# Patient Record
Sex: Female | Born: 1945 | Race: White | Hispanic: No | Marital: Married | Smoking: Former smoker
Health system: Southern US, Community
[De-identification: ages and names within clinical notes are randomized; demographics above are authoritative.]

## PROBLEM LIST (undated history)

## (undated) DIAGNOSIS — M2550 Pain in unspecified joint: Secondary | ICD-10-CM

## (undated) DIAGNOSIS — E0510 Thyrotoxicosis with toxic single thyroid nodule without thyrotoxic crisis or storm: Secondary | ICD-10-CM

## (undated) DIAGNOSIS — M1990 Unspecified osteoarthritis, unspecified site: Secondary | ICD-10-CM

## (undated) DIAGNOSIS — Z87898 Personal history of other specified conditions: Secondary | ICD-10-CM

## (undated) DIAGNOSIS — E785 Hyperlipidemia, unspecified: Secondary | ICD-10-CM

## (undated) DIAGNOSIS — G4733 Obstructive sleep apnea (adult) (pediatric): Secondary | ICD-10-CM

## (undated) DIAGNOSIS — Z9289 Personal history of other medical treatment: Secondary | ICD-10-CM

## (undated) DIAGNOSIS — R600 Localized edema: Secondary | ICD-10-CM

## (undated) DIAGNOSIS — I10 Essential (primary) hypertension: Secondary | ICD-10-CM

## (undated) DIAGNOSIS — K219 Gastro-esophageal reflux disease without esophagitis: Secondary | ICD-10-CM

## (undated) DIAGNOSIS — R51 Headache: Secondary | ICD-10-CM

## (undated) DIAGNOSIS — C4491 Basal cell carcinoma of skin, unspecified: Secondary | ICD-10-CM

## (undated) DIAGNOSIS — J309 Allergic rhinitis, unspecified: Secondary | ICD-10-CM

## (undated) DIAGNOSIS — K5900 Constipation, unspecified: Secondary | ICD-10-CM

## (undated) DIAGNOSIS — R519 Headache, unspecified: Secondary | ICD-10-CM

## (undated) DIAGNOSIS — E7800 Pure hypercholesterolemia, unspecified: Secondary | ICD-10-CM

## (undated) DIAGNOSIS — E039 Hypothyroidism, unspecified: Secondary | ICD-10-CM

## (undated) HISTORY — DX: Pain in unspecified joint: M25.50

## (undated) HISTORY — PX: BREAST BIOPSY: SHX20

## (undated) HISTORY — DX: Morbid (severe) obesity due to excess calories: E66.01

## (undated) HISTORY — DX: Essential (primary) hypertension: I10

## (undated) HISTORY — DX: Obstructive sleep apnea (adult) (pediatric): G47.33

## (undated) HISTORY — PX: ANKLE ARTHROTOMY: SHX879

## (undated) HISTORY — PX: KNEE ARTHROSCOPY: SUR90

## (undated) HISTORY — PX: HIP FRACTURE SURGERY: SHX118

## (undated) HISTORY — DX: Hyperlipidemia, unspecified: E78.5

## (undated) HISTORY — DX: Unspecified osteoarthritis, unspecified site: M19.90

## (undated) HISTORY — DX: Pure hypercholesterolemia, unspecified: E78.00

## (undated) HISTORY — DX: Basal cell carcinoma of skin, unspecified: C44.91

## (undated) HISTORY — PX: CARPAL TUNNEL RELEASE: SHX101

## (undated) HISTORY — PX: OTHER SURGICAL HISTORY: SHX169

## (undated) HISTORY — DX: Localized edema: R60.0

## (undated) HISTORY — PX: BREAST SURGERY: SHX581

## (undated) HISTORY — DX: Thyrotoxicosis with toxic single thyroid nodule without thyrotoxic crisis or storm: E05.10

## (undated) HISTORY — DX: Hypothyroidism, unspecified: E03.9

## (undated) HISTORY — DX: Allergic rhinitis, unspecified: J30.9

## (undated) HISTORY — DX: Constipation, unspecified: K59.00

---

## 1971-02-20 HISTORY — PX: DILATION AND CURETTAGE OF UTERUS: SHX78

## 1972-02-20 HISTORY — PX: DILATION AND CURETTAGE OF UTERUS: SHX78

## 1973-02-19 HISTORY — PX: DILATION AND CURETTAGE OF UTERUS: SHX78

## 1975-02-20 HISTORY — PX: OTHER SURGICAL HISTORY: SHX169

## 1976-02-20 HISTORY — PX: DILATION AND CURETTAGE OF UTERUS: SHX78

## 1989-02-19 HISTORY — PX: DILATION AND CURETTAGE OF UTERUS: SHX78

## 1997-06-23 ENCOUNTER — Ambulatory Visit (HOSPITAL_COMMUNITY): Admission: RE | Admit: 1997-06-23 | Discharge: 1997-06-23 | Payer: Self-pay | Admitting: Obstetrics and Gynecology

## 1997-07-26 ENCOUNTER — Ambulatory Visit (HOSPITAL_COMMUNITY): Admission: RE | Admit: 1997-07-26 | Discharge: 1997-07-26 | Payer: Self-pay | Admitting: Obstetrics and Gynecology

## 1998-02-19 HISTORY — PX: IUD REMOVAL: SHX5392

## 1998-04-06 ENCOUNTER — Ambulatory Visit (HOSPITAL_COMMUNITY): Admission: RE | Admit: 1998-04-06 | Discharge: 1998-04-06 | Payer: Self-pay | Admitting: Obstetrics and Gynecology

## 1998-04-06 ENCOUNTER — Encounter: Payer: Self-pay | Admitting: Obstetrics and Gynecology

## 1998-08-24 ENCOUNTER — Encounter: Payer: Self-pay | Admitting: Emergency Medicine

## 1998-08-24 ENCOUNTER — Emergency Department (HOSPITAL_COMMUNITY): Admission: EM | Admit: 1998-08-24 | Discharge: 1998-08-24 | Payer: Self-pay | Admitting: Emergency Medicine

## 1998-11-01 ENCOUNTER — Ambulatory Visit (HOSPITAL_COMMUNITY): Admission: RE | Admit: 1998-11-01 | Discharge: 1998-11-01 | Payer: Self-pay | Admitting: Obstetrics and Gynecology

## 1998-11-01 ENCOUNTER — Encounter: Payer: Self-pay | Admitting: Obstetrics and Gynecology

## 1999-02-26 ENCOUNTER — Emergency Department (HOSPITAL_COMMUNITY): Admission: EM | Admit: 1999-02-26 | Discharge: 1999-02-26 | Payer: Self-pay | Admitting: Emergency Medicine

## 1999-02-26 ENCOUNTER — Encounter: Payer: Self-pay | Admitting: Emergency Medicine

## 1999-04-14 ENCOUNTER — Other Ambulatory Visit: Admission: RE | Admit: 1999-04-14 | Discharge: 1999-04-14 | Payer: Self-pay | Admitting: Obstetrics and Gynecology

## 1999-12-05 ENCOUNTER — Ambulatory Visit (HOSPITAL_COMMUNITY): Admission: RE | Admit: 1999-12-05 | Discharge: 1999-12-05 | Payer: Self-pay | Admitting: Obstetrics and Gynecology

## 1999-12-05 ENCOUNTER — Encounter: Payer: Self-pay | Admitting: Obstetrics and Gynecology

## 2000-07-08 ENCOUNTER — Encounter: Admission: RE | Admit: 2000-07-08 | Discharge: 2000-07-08 | Payer: Self-pay | Admitting: Obstetrics and Gynecology

## 2000-07-08 ENCOUNTER — Encounter: Payer: Self-pay | Admitting: Obstetrics and Gynecology

## 2000-10-17 ENCOUNTER — Other Ambulatory Visit: Admission: RE | Admit: 2000-10-17 | Discharge: 2000-10-17 | Payer: Self-pay | Admitting: Obstetrics and Gynecology

## 2000-12-31 ENCOUNTER — Ambulatory Visit (HOSPITAL_COMMUNITY): Admission: RE | Admit: 2000-12-31 | Discharge: 2000-12-31 | Payer: Self-pay | Admitting: Obstetrics and Gynecology

## 2000-12-31 ENCOUNTER — Encounter: Payer: Self-pay | Admitting: Obstetrics and Gynecology

## 2001-03-05 ENCOUNTER — Ambulatory Visit (HOSPITAL_COMMUNITY): Admission: RE | Admit: 2001-03-05 | Discharge: 2001-03-05 | Payer: Self-pay | Admitting: Gastroenterology

## 2001-11-24 ENCOUNTER — Other Ambulatory Visit: Admission: RE | Admit: 2001-11-24 | Discharge: 2001-11-24 | Payer: Self-pay | Admitting: Obstetrics and Gynecology

## 2002-05-20 ENCOUNTER — Ambulatory Visit (HOSPITAL_COMMUNITY): Admission: RE | Admit: 2002-05-20 | Discharge: 2002-05-20 | Payer: Self-pay | Admitting: Obstetrics and Gynecology

## 2002-05-20 ENCOUNTER — Encounter: Payer: Self-pay | Admitting: Obstetrics and Gynecology

## 2003-02-24 ENCOUNTER — Other Ambulatory Visit: Admission: RE | Admit: 2003-02-24 | Discharge: 2003-02-24 | Payer: Self-pay | Admitting: Obstetrics and Gynecology

## 2003-06-02 ENCOUNTER — Ambulatory Visit (HOSPITAL_COMMUNITY): Admission: RE | Admit: 2003-06-02 | Discharge: 2003-06-02 | Payer: Self-pay | Admitting: Obstetrics and Gynecology

## 2004-05-26 ENCOUNTER — Other Ambulatory Visit: Admission: RE | Admit: 2004-05-26 | Discharge: 2004-05-26 | Payer: Self-pay | Admitting: Obstetrics and Gynecology

## 2004-10-10 ENCOUNTER — Ambulatory Visit (HOSPITAL_COMMUNITY): Admission: RE | Admit: 2004-10-10 | Discharge: 2004-10-10 | Payer: Self-pay | Admitting: Obstetrics and Gynecology

## 2005-08-13 ENCOUNTER — Encounter: Admission: RE | Admit: 2005-08-13 | Discharge: 2005-08-13 | Payer: Self-pay | Admitting: Sports Medicine

## 2005-08-24 ENCOUNTER — Other Ambulatory Visit: Admission: RE | Admit: 2005-08-24 | Discharge: 2005-08-24 | Payer: Self-pay | Admitting: Obstetrics and Gynecology

## 2005-09-05 ENCOUNTER — Encounter: Admission: RE | Admit: 2005-09-05 | Discharge: 2005-09-05 | Payer: Self-pay | Admitting: Obstetrics and Gynecology

## 2006-10-01 ENCOUNTER — Ambulatory Visit (HOSPITAL_COMMUNITY): Admission: RE | Admit: 2006-10-01 | Discharge: 2006-10-01 | Payer: Self-pay | Admitting: Obstetrics and Gynecology

## 2007-10-30 ENCOUNTER — Ambulatory Visit (HOSPITAL_COMMUNITY): Admission: RE | Admit: 2007-10-30 | Discharge: 2007-10-30 | Payer: Self-pay | Admitting: Obstetrics and Gynecology

## 2007-11-03 ENCOUNTER — Ambulatory Visit (HOSPITAL_BASED_OUTPATIENT_CLINIC_OR_DEPARTMENT_OTHER): Admission: RE | Admit: 2007-11-03 | Discharge: 2007-11-03 | Payer: Self-pay | Admitting: Otolaryngology

## 2007-11-08 ENCOUNTER — Ambulatory Visit: Payer: Self-pay | Admitting: Internal Medicine

## 2007-11-19 ENCOUNTER — Encounter (HOSPITAL_COMMUNITY): Admission: RE | Admit: 2007-11-19 | Discharge: 2008-01-28 | Payer: Self-pay | Admitting: Internal Medicine

## 2007-12-05 ENCOUNTER — Encounter (HOSPITAL_COMMUNITY): Admission: RE | Admit: 2007-12-05 | Discharge: 2008-01-29 | Payer: Self-pay | Admitting: Internal Medicine

## 2009-01-17 ENCOUNTER — Ambulatory Visit (HOSPITAL_BASED_OUTPATIENT_CLINIC_OR_DEPARTMENT_OTHER): Admission: RE | Admit: 2009-01-17 | Discharge: 2009-01-18 | Payer: Self-pay | Admitting: Orthopedic Surgery

## 2009-05-17 ENCOUNTER — Encounter: Admission: RE | Admit: 2009-05-17 | Discharge: 2009-05-17 | Payer: Self-pay | Admitting: Orthopedic Surgery

## 2009-06-09 ENCOUNTER — Ambulatory Visit (HOSPITAL_BASED_OUTPATIENT_CLINIC_OR_DEPARTMENT_OTHER): Admission: RE | Admit: 2009-06-09 | Discharge: 2009-06-10 | Payer: Self-pay | Admitting: Orthopedic Surgery

## 2009-09-29 ENCOUNTER — Ambulatory Visit (HOSPITAL_BASED_OUTPATIENT_CLINIC_OR_DEPARTMENT_OTHER): Admission: RE | Admit: 2009-09-29 | Discharge: 2009-09-29 | Payer: Self-pay | Admitting: Orthopedic Surgery

## 2009-11-08 ENCOUNTER — Ambulatory Visit (HOSPITAL_COMMUNITY): Admission: RE | Admit: 2009-11-08 | Discharge: 2009-11-08 | Payer: Self-pay | Admitting: Obstetrics and Gynecology

## 2010-05-05 LAB — POCT I-STAT, CHEM 8
Chloride: 109 mEq/L (ref 96–112)
Creatinine, Ser: 0.6 mg/dL (ref 0.4–1.2)
HCT: 41 % (ref 36.0–46.0)

## 2010-05-09 LAB — POCT I-STAT, CHEM 8
Calcium, Ion: 1.15 mmol/L (ref 1.12–1.32)
Creatinine, Ser: 0.6 mg/dL (ref 0.4–1.2)
Glucose, Bld: 130 mg/dL — ABNORMAL HIGH (ref 70–99)
HCT: 41 % (ref 36.0–46.0)
Potassium: 3.9 mEq/L (ref 3.5–5.1)
TCO2: 28 mmol/L (ref 0–100)

## 2010-05-09 LAB — POCT HEMOGLOBIN-HEMACUE: Hemoglobin: 13.2 g/dL (ref 12.0–15.0)

## 2010-05-24 LAB — POCT HEMOGLOBIN-HEMACUE: Hemoglobin: 13.2 g/dL (ref 12.0–15.0)

## 2010-07-04 NOTE — Procedures (Signed)
NAME:  Cassandra Ho, Cassandra Ho           ACCOUNT NO.:  [id number]   MEDICAL RECORD NO.:  [id number]          PATIENT TYPE:  OUT   LOCATION:  SLEEP CENTER                 FACILITY:  [facility]   PHYSICIAN:  Clinton D. Maple Hudson, MD, FCCP, FACPDATE OF BIRTH:  1945/08/16   DATE OF STUDY:  11/03/2007                            NOCTURNAL POLYSOMNOGRAM   REFERRING PHYSICIAN:   REQUESTING PHYSICIAN:  Lucky Cowboy, M.D.   INDICATION FOR STUDY:  Hypersomnia with sleep apnea.   EPWORTH SLEEPINESS SCORE:  6/24.   BMI:  34.5.   WEIGHT:  220 pounds.   HEIGHT:  67 inches.   NECK:  15 inches.   HOME MEDICATIONS:  Charted and reviewed.   SLEEP ARCHITECTURE:  Total sleep time 226 minutes with sleep efficiency  57.4%.  Sleep latency was 64 minutes with sleep onset not sustained  until 1:00 a.m.  Stage I was 10.8%.  Stage II 89.2%.  Stages III and REM  were absent.  Awake after sleep onset 107 minutes.  Arousal index 30.3,  indicating increased EEG arousal.  Bedtime medication included Nasonex  and Tylenol P.M.   RESPIRATORY DATA:  Apnea-hypopnea index (AHI) 45.7 per hour.  A total of  172 events were scored, including 121 obstructive apneas, 3 central  apneas, 2 mixed apneas, and 46 hypopneas.  Events were more common while  supine but noted in all positions.  REM AHI N/A.  She was unable to  accumulate enough sleep time to meet requirements for CPAP titration by  split-study protocol on this study night.   OXYGEN DATA:  Moderately loud snoring with oxygen desaturation to a  nadir of 84%.  Mean oxygen saturation through the study was 90.6% on  room air.  A total of 13.7 minutes was spent with saturation less than  88% on room air.   CARDIAC DATA:  Normal sinus rhythm with occasional PAC.   MOVEMENT-PARASOMNIA:  No significant movement disturbance.  No bathroom  trips.   IMPRESSIONS-RECOMMENDATIONS:  1. Moderate obstructive sleep apnea/hypopnea syndrome, apnea-hypopnea      index (AHI) 45.7 per  hour.  Events were seen in all positions but      more common while supine.  Moderately loud snoring with oxygen      desaturation to a nadir of 84% on room air.  2. She did not meet split protocol criteria for CPAP titration because      of insufficient sleep reflecting delayed sleep onset until 1:00      a.m.  Consider return for CPAP titration.  Will evaluate for      alternative management as appropriate.      Clinton D. Maple Hudson, MD, [facility], FACP  Diplomate, Biomedical engineer of Sleep Medicine  Electronically Signed     CDY/MEDQ  D:  11/08/2007 10:40:24  T:  11/08/2007 14:49:56  Job:  [id number]

## 2010-07-07 NOTE — Procedures (Signed)
[facility]  Patient:    Cassandra Ho, Cassandra Ho Visit Number: [id number] MRN: [id number]          Service Type: Attending:  Verlin Grills, M.D. Dictated by:   Verlin Grills, M.D. Proc. Date: 03/05/01   CC:         Hal T. Stoneking, M.D.   Procedure Report  PROCEDURE:  Screening colonoscopy.  INDICATION FOR PROCEDURE:  Ms. [name] is a 65 year old female born 1945/05/08. Ten years ago, she underwent a screening flexible proctosigmoidoscopy which apparently was normal. She is now due for a surveillance colonoscopy with polypectomy to prevent colon cancer.  I discussed with Ms. Barnaby the complications associated with colonoscopy and polypectomy including a 15/1000 risk of bleeding and 05/998 risk of colon perforation requiring surgical repair. Ms. Roell has signed the operative permit.  ENDOSCOPIST:  Verlin Grills, M.D.  PREMEDICATION:  Versed 7.5 mg, Demerol 50 mg.  ENDOSCOPE:  Olympus pediatric colonoscope.  DESCRIPTION OF PROCEDURE:  After obtaining informed consent, the patient was placed in the left lateral decubitus position. I administered intravenous Demerol and intravenous Versed to achieve conscious sedation for the procedure. The patients cardiac rhythm, oxygen saturation and blood pressure were monitored throughout the procedure and documented in the medical record.  Anal inspection was normal. Digital rectal exam was normal. The Olympus pediatric video colonoscope was introduced into the rectum and easily advanced to the cecum. A normal appearing ileocecal valve was intubated and the distal ileum inspected. Colonic preparation for the exam today was excellent.  RECTUM:  Normal.  SIGMOID COLON/DESCENDING COLON:  Normal.  SPLENIC FLEXURE:  Normal.  TRANSVERSE COLON:  Normal.  HEPATIC FLEXURE:  Normal.  ASCENDING COLON:  Normal.  CECUM/ILEOCECAL VALVE:  Normal.  DISTAL ILEUM:   Normal.  ASSESSMENT:  Normal screening proctocolonoscopy to the cecum with intubation of a normal appearing ileocecal valve and distal ileal inspection. Dictated by:   Verlin Grills, M.D. Attending:  Verlin Grills, M.D. DD:  03/05/01 TD:  03/05/01 Job: [id number]

## 2011-05-10 ENCOUNTER — Other Ambulatory Visit (HOSPITAL_COMMUNITY): Payer: Self-pay | Admitting: Cardiology

## 2011-05-10 DIAGNOSIS — Z1231 Encounter for screening mammogram for malignant neoplasm of breast: Secondary | ICD-10-CM

## 2011-06-04 ENCOUNTER — Ambulatory Visit (HOSPITAL_COMMUNITY): Payer: Self-pay

## 2011-06-27 ENCOUNTER — Ambulatory Visit (HOSPITAL_COMMUNITY)
Admission: RE | Admit: 2011-06-27 | Discharge: 2011-06-27 | Disposition: A | Discharge status: Home / Self Care | Payer: 59 | Source: Ambulatory Visit | Attending: Cardiology | Admitting: Cardiology

## 2011-06-27 DIAGNOSIS — Z1231 Encounter for screening mammogram for malignant neoplasm of breast: Secondary | ICD-10-CM

## 2011-10-18 DIAGNOSIS — G4730 Sleep apnea, unspecified: Secondary | ICD-10-CM | POA: Diagnosis not present

## 2011-10-30 DIAGNOSIS — E7800 Pure hypercholesterolemia, unspecified: Secondary | ICD-10-CM | POA: Diagnosis not present

## 2011-10-30 DIAGNOSIS — E890 Postprocedural hypothyroidism: Secondary | ICD-10-CM | POA: Diagnosis not present

## 2011-11-12 DIAGNOSIS — Z23 Encounter for immunization: Secondary | ICD-10-CM | POA: Diagnosis not present

## 2012-01-04 DIAGNOSIS — L919 Hypertrophic disorder of the skin, unspecified: Secondary | ICD-10-CM | POA: Diagnosis not present

## 2012-01-04 DIAGNOSIS — C44711 Basal cell carcinoma of skin of unspecified lower limb, including hip: Secondary | ICD-10-CM | POA: Diagnosis not present

## 2012-01-04 DIAGNOSIS — L821 Other seborrheic keratosis: Secondary | ICD-10-CM | POA: Diagnosis not present

## 2012-01-04 DIAGNOSIS — D485 Neoplasm of uncertain behavior of skin: Secondary | ICD-10-CM | POA: Diagnosis not present

## 2012-01-04 DIAGNOSIS — D239 Other benign neoplasm of skin, unspecified: Secondary | ICD-10-CM | POA: Diagnosis not present

## 2012-01-04 DIAGNOSIS — L820 Inflamed seborrheic keratosis: Secondary | ICD-10-CM | POA: Diagnosis not present

## 2012-01-04 DIAGNOSIS — L819 Disorder of pigmentation, unspecified: Secondary | ICD-10-CM | POA: Diagnosis not present

## 2012-01-23 DIAGNOSIS — D485 Neoplasm of uncertain behavior of skin: Secondary | ICD-10-CM | POA: Diagnosis not present

## 2012-01-23 DIAGNOSIS — C44711 Basal cell carcinoma of skin of unspecified lower limb, including hip: Secondary | ICD-10-CM | POA: Diagnosis not present

## 2012-01-23 DIAGNOSIS — L905 Scar conditions and fibrosis of skin: Secondary | ICD-10-CM | POA: Diagnosis not present

## 2012-04-30 DIAGNOSIS — Z79899 Other long term (current) drug therapy: Secondary | ICD-10-CM | POA: Diagnosis not present

## 2012-04-30 DIAGNOSIS — E782 Mixed hyperlipidemia: Secondary | ICD-10-CM | POA: Diagnosis not present

## 2012-10-22 DIAGNOSIS — Z79899 Other long term (current) drug therapy: Secondary | ICD-10-CM | POA: Diagnosis not present

## 2012-10-22 DIAGNOSIS — G4733 Obstructive sleep apnea (adult) (pediatric): Secondary | ICD-10-CM | POA: Diagnosis not present

## 2012-10-22 DIAGNOSIS — E669 Obesity, unspecified: Secondary | ICD-10-CM | POA: Diagnosis not present

## 2012-10-22 DIAGNOSIS — E7800 Pure hypercholesterolemia, unspecified: Secondary | ICD-10-CM | POA: Diagnosis not present

## 2012-10-22 DIAGNOSIS — I10 Essential (primary) hypertension: Secondary | ICD-10-CM | POA: Diagnosis not present

## 2012-10-23 DIAGNOSIS — E0510 Thyrotoxicosis with toxic single thyroid nodule without thyrotoxic crisis or storm: Secondary | ICD-10-CM | POA: Diagnosis not present

## 2012-10-23 DIAGNOSIS — E890 Postprocedural hypothyroidism: Secondary | ICD-10-CM | POA: Diagnosis not present

## 2012-10-23 DIAGNOSIS — E669 Obesity, unspecified: Secondary | ICD-10-CM | POA: Diagnosis not present

## 2012-10-24 DIAGNOSIS — M25579 Pain in unspecified ankle and joints of unspecified foot: Secondary | ICD-10-CM | POA: Diagnosis not present

## 2012-10-24 DIAGNOSIS — M76829 Posterior tibial tendinitis, unspecified leg: Secondary | ICD-10-CM | POA: Diagnosis not present

## 2012-11-06 DIAGNOSIS — E7800 Pure hypercholesterolemia, unspecified: Secondary | ICD-10-CM | POA: Diagnosis not present

## 2012-11-06 DIAGNOSIS — Z79899 Other long term (current) drug therapy: Secondary | ICD-10-CM | POA: Diagnosis not present

## 2012-11-12 ENCOUNTER — Other Ambulatory Visit: Payer: Self-pay | Admitting: Cardiology

## 2012-11-12 DIAGNOSIS — E7800 Pure hypercholesterolemia, unspecified: Secondary | ICD-10-CM

## 2012-11-12 DIAGNOSIS — Z79899 Other long term (current) drug therapy: Secondary | ICD-10-CM

## 2012-11-13 DIAGNOSIS — Z23 Encounter for immunization: Secondary | ICD-10-CM | POA: Diagnosis not present

## 2013-01-01 ENCOUNTER — Other Ambulatory Visit (HOSPITAL_COMMUNITY): Payer: Self-pay | Admitting: Internal Medicine

## 2013-01-01 DIAGNOSIS — Z1231 Encounter for screening mammogram for malignant neoplasm of breast: Secondary | ICD-10-CM

## 2013-01-02 ENCOUNTER — Ambulatory Visit (HOSPITAL_COMMUNITY)
Admission: RE | Admit: 2013-01-02 | Discharge: 2013-01-02 | Disposition: A | Discharge status: Home / Self Care | Payer: Medicare Other | Source: Ambulatory Visit | Attending: Internal Medicine | Admitting: Internal Medicine

## 2013-01-02 DIAGNOSIS — Z1231 Encounter for screening mammogram for malignant neoplasm of breast: Secondary | ICD-10-CM | POA: Diagnosis not present

## 2013-05-11 ENCOUNTER — Other Ambulatory Visit (INDEPENDENT_AMBULATORY_CARE_PROVIDER_SITE_OTHER): Payer: Medicare Other

## 2013-05-11 DIAGNOSIS — Z79899 Other long term (current) drug therapy: Secondary | ICD-10-CM | POA: Diagnosis not present

## 2013-05-11 DIAGNOSIS — E7800 Pure hypercholesterolemia, unspecified: Secondary | ICD-10-CM | POA: Diagnosis not present

## 2013-05-11 LAB — ALT: ALT: 22 U/L (ref 0–35)

## 2013-05-12 ENCOUNTER — Encounter: Payer: Self-pay | Admitting: General Surgery

## 2013-05-12 ENCOUNTER — Other Ambulatory Visit: Payer: Self-pay | Admitting: General Surgery

## 2013-05-12 DIAGNOSIS — E7800 Pure hypercholesterolemia, unspecified: Secondary | ICD-10-CM

## 2013-05-12 LAB — NMR LIPOPROFILE WITH LIPIDS
CHOLESTEROL, TOTAL: 127 mg/dL (ref <200)
HDL PARTICLE NUMBER: 42.1 umol/L (ref >=30.5)
HDL Size: 8.8 nm — ABNORMAL LOW (ref >=9.2)
HDL-C: 49 mg/dL (ref >=40)
LARGE HDL: 2.5 umol/L — AB (ref >=4.8)
LDL CALC: 49 mg/dL (ref <100)
LDL Particle Number: 1146 nmol/L — ABNORMAL HIGH (ref <1000)
LDL Size: 19.8 nm — ABNORMAL LOW (ref >20.5)
LP-IR Score: 66 — ABNORMAL HIGH (ref <=45)
Large VLDL-P: 4.7 nmol/L — ABNORMAL HIGH (ref <=2.7)
SMALL LDL PARTICLE NUMBER: 837 nmol/L — AB (ref <=527)
TRIGLYCERIDES: 144 mg/dL (ref <150)
VLDL Size: 49.5 nm — ABNORMAL HIGH (ref <=46.6)

## 2013-09-30 ENCOUNTER — Other Ambulatory Visit: Payer: Self-pay | Admitting: Cardiology

## 2013-10-16 ENCOUNTER — Encounter: Payer: Self-pay | Admitting: General Surgery

## 2013-10-16 DIAGNOSIS — E7800 Pure hypercholesterolemia, unspecified: Secondary | ICD-10-CM

## 2013-10-16 DIAGNOSIS — Z4689 Encounter for fitting and adjustment of other specified devices: Secondary | ICD-10-CM | POA: Insufficient documentation

## 2013-10-16 DIAGNOSIS — Z79899 Other long term (current) drug therapy: Secondary | ICD-10-CM | POA: Insufficient documentation

## 2013-10-16 DIAGNOSIS — E669 Obesity, unspecified: Secondary | ICD-10-CM | POA: Insufficient documentation

## 2013-10-16 DIAGNOSIS — I10 Essential (primary) hypertension: Secondary | ICD-10-CM | POA: Insufficient documentation

## 2013-10-16 DIAGNOSIS — E785 Hyperlipidemia, unspecified: Secondary | ICD-10-CM | POA: Insufficient documentation

## 2013-10-16 DIAGNOSIS — G4733 Obstructive sleep apnea (adult) (pediatric): Secondary | ICD-10-CM

## 2013-10-16 HISTORY — DX: Obstructive sleep apnea (adult) (pediatric): G47.33

## 2013-10-21 ENCOUNTER — Ambulatory Visit: Payer: 59 | Admitting: Cardiology

## 2013-11-12 ENCOUNTER — Other Ambulatory Visit: Payer: Medicare Other

## 2013-11-19 DIAGNOSIS — Z23 Encounter for immunization: Secondary | ICD-10-CM | POA: Diagnosis not present

## 2013-11-20 DIAGNOSIS — Z23 Encounter for immunization: Secondary | ICD-10-CM | POA: Diagnosis not present

## 2013-12-22 ENCOUNTER — Encounter: Payer: Self-pay | Admitting: Cardiology

## 2013-12-22 ENCOUNTER — Ambulatory Visit (INDEPENDENT_AMBULATORY_CARE_PROVIDER_SITE_OTHER): Payer: Medicare Other | Admitting: Cardiology

## 2013-12-22 VITALS — BP 132/90 | HR 85 | Ht 66.5 in | Wt 303.6 lb

## 2013-12-22 DIAGNOSIS — E7800 Pure hypercholesterolemia, unspecified: Secondary | ICD-10-CM

## 2013-12-22 DIAGNOSIS — G4733 Obstructive sleep apnea (adult) (pediatric): Secondary | ICD-10-CM

## 2013-12-22 DIAGNOSIS — I10 Essential (primary) hypertension: Secondary | ICD-10-CM | POA: Diagnosis not present

## 2013-12-22 MED ORDER — LOSARTAN POTASSIUM-HCTZ 50-12.5 MG PO TABS: ORAL_TABLET | ORAL | Status: DC

## 2013-12-22 MED ORDER — ROSUVASTATIN CALCIUM 20 MG PO TABS: ORAL_TABLET | ORAL | Status: DC

## 2013-12-22 NOTE — Patient Instructions (Signed)
Your physician recommends that you return for lab work on Monday, November 9th. (This will be FASTING labs)  Your physician wants you to follow-up in: one year with Dr. Radford Pax. You will receive a reminder letter in the mail two months in advance. If you don't receive a letter, please call our office to schedule the follow-up appointment.

## 2013-12-22 NOTE — Progress Notes (Signed)
Cassandra Ho, Cassandra Ho, Cassandra  Ho Phone: [phone] Fax:  [phone]  Date:  12/22/2013   ID:  Cassandra Ho, DOB 04-15-1945, MRN [id number]  PCP:  Delrae Rend, MD  Cardiologist:  Fransico Him, MD    History of Present Illness: Cassandra Ho is a 68 y.o. female with a history of OSA on CPAP and morbid obesity who presents today for followup.  She is doing well.  She did not tolerate CPAP therapy and got fitted for an oral device which she tolerates well. She has not daytime sleepiness and feels rested when she gets up in the am.  She is riding a stationary bike 3-5 miles daily.  She denies any chest pain, SOB, DOE, palpitations or dizziness. She has chronic LLE edema from prior injury.  Wt Readings from Last 3 Encounters:  12/22/13 303 lb 9.6 oz (137.712 kg)     Past Medical History  Diagnosis Date  . Morbid obesity   . Dyslipidemia   . Allergic rhinitis   . Hypothyroidism (acquired)     Post-ablation  . Toxic solitary thyroid nodule     radioactive iodine therapy with 31.4 mCi of I-131 on 12/05/2007  . Palpitations   . OSA (obstructive sleep apnea)     intolerant to CPAP and now using an oral device  . Hypertension     Current Outpatient Prescriptions  Medication Sig Dispense Refill  . Ascorbic Acid (VITAMIN C) 100 MG tablet Take 100 mg by mouth daily.    Marland Kitchen aspirin 81 MG tablet Take 81 mg by mouth daily.    . Calcium Carbonate-Vit D-Min (CALCIUM 1200 PO) Take 1 tablet by mouth daily.    . CRESTOR 20 MG tablet Take 1 tablet by mouth once a day 90 tablet 1  . diphenhydramine-acetaminophen (TYLENOL PM) 25-500 MG TABS Take 1 tablet by mouth at bedtime as needed.    . fenofibrate 160 MG tablet Take 1 tablet by mouth  daily with food 90 tablet 1  . FLUZONE HIGH-DOSE 0.5 ML SUSY   0  . levothyroxine (SYNTHROID, LEVOTHROID) 150 MCG tablet Take 150 mcg by mouth daily before breakfast.    . losartan-hydrochlorothiazide (HYZAAR) 50-12.5 MG per  tablet Take 1 tablet by mouth once a day 90 tablet 1  . Omeprazole-Sodium Bicarbonate (ZEGERID) 20-1100 MG CAPS capsule Take 1 capsule by mouth daily before breakfast.    . OVER THE COUNTER MEDICATION Ultimate OMEGA 3 tablets once a day    . PREVNAR 13 SUSP injection   0  . omeprazole (PRILOSEC) 20 MG capsule Take 20 mg by mouth daily.     No current facility-administered medications for this visit.    Allergies:   No Known Allergies  Social History:  The patient  reports that she has quit smoking. She does not have any smokeless tobacco history on file. She reports that she drinks alcohol. She reports that she does not use illicit drugs.   Family History:  The patient's family history includes Alcoholism in her father; Breast cancer in her sister; Cirrhosis in her father; Fibromyalgia in her sister; Lung cancer in her mother; Melanoma in her mother; Thyroid cancer in her mother.   ROS:  Please see the history of present illness.      All other systems reviewed and negative.   PHYSICAL EXAM: VS:  BP 132/90 mmHg  Pulse 85  Ht 5' 6.5" (1.689 m)  Wt 303 lb 9.6 oz (137.712 kg)  BMI  48.27 kg/m2 Well nourished, well developed, in no acute distress HEENT: normal Neck: no JVD Cardiac:  normal S1, S2; RRR; no murmur Lungs:  clear to auscultation bilaterally, no wheezing, rhonchi or rales Abd: soft, nontender, no hepatomegaly Ext: no edema Skin: warm and dry Neuro:  CNs 2-12 intact, no focal abnormalities noted  EKG:  NSR with no ST changes   ASSESSMENT AND PLAN: 1.  OSA - she did not tolerate her CPAP and is now using an oral device.   2.  Obesity - she is walking 3-5 miles daily on stationary bike 3.  Dyslipidemia - continue crestor and recheck NMR lipid panel and ALT 4.  HTN - borderline control - continue Hyzaar - at home it runs 132/55mmHg  Followup with me in 1 year   Signed, Fransico Him, MD [facility] 12/22/2013 3:55 PM

## 2013-12-28 ENCOUNTER — Encounter: Payer: Self-pay | Admitting: Cardiology

## 2013-12-28 ENCOUNTER — Other Ambulatory Visit (INDEPENDENT_AMBULATORY_CARE_PROVIDER_SITE_OTHER): Payer: Medicare Other | Admitting: *Deleted

## 2013-12-28 DIAGNOSIS — E890 Postprocedural hypothyroidism: Secondary | ICD-10-CM | POA: Diagnosis not present

## 2013-12-28 DIAGNOSIS — E7800 Pure hypercholesterolemia, unspecified: Secondary | ICD-10-CM

## 2013-12-28 LAB — LIPID PANEL
CHOLESTEROL: 126 mg/dL (ref 0–200)
HDL: 37.2 mg/dL — ABNORMAL LOW (ref >39.00)
LDL Cholesterol: 61 mg/dL (ref 0–99)
NonHDL: 88.8
TRIGLYCERIDES: 139 mg/dL (ref 0.0–149.0)
Total CHOL/HDL Ratio: 3
VLDL: 27.8 mg/dL (ref 0.0–40.0)

## 2013-12-28 LAB — ALT: ALT: 20 U/L (ref 0–35)

## 2013-12-31 DIAGNOSIS — E890 Postprocedural hypothyroidism: Secondary | ICD-10-CM | POA: Diagnosis not present

## 2013-12-31 DIAGNOSIS — Z1211 Encounter for screening for malignant neoplasm of colon: Secondary | ICD-10-CM | POA: Diagnosis not present

## 2013-12-31 DIAGNOSIS — Z13820 Encounter for screening for osteoporosis: Secondary | ICD-10-CM | POA: Diagnosis not present

## 2014-01-11 ENCOUNTER — Other Ambulatory Visit (HOSPITAL_COMMUNITY): Payer: Self-pay | Admitting: Internal Medicine

## 2014-01-11 DIAGNOSIS — Z1231 Encounter for screening mammogram for malignant neoplasm of breast: Secondary | ICD-10-CM

## 2014-01-25 ENCOUNTER — Ambulatory Visit (HOSPITAL_COMMUNITY)
Admission: RE | Admit: 2014-01-25 | Discharge: 2014-01-25 | Disposition: A | Discharge status: Home / Self Care | Payer: Medicare Other | Source: Ambulatory Visit | Attending: Internal Medicine | Admitting: Internal Medicine

## 2014-01-25 DIAGNOSIS — Z1231 Encounter for screening mammogram for malignant neoplasm of breast: Secondary | ICD-10-CM | POA: Insufficient documentation

## 2014-01-27 DIAGNOSIS — Z780 Asymptomatic menopausal state: Secondary | ICD-10-CM | POA: Diagnosis not present

## 2014-01-27 DIAGNOSIS — Z13820 Encounter for screening for osteoporosis: Secondary | ICD-10-CM | POA: Diagnosis not present

## 2014-04-07 ENCOUNTER — Other Ambulatory Visit: Payer: Self-pay | Admitting: Gastroenterology

## 2014-04-21 DIAGNOSIS — R05 Cough: Secondary | ICD-10-CM | POA: Diagnosis not present

## 2014-04-21 DIAGNOSIS — Z6841 Body Mass Index (BMI) 40.0 and over, adult: Secondary | ICD-10-CM | POA: Diagnosis not present

## 2014-04-21 DIAGNOSIS — R6884 Jaw pain: Secondary | ICD-10-CM | POA: Diagnosis not present

## 2014-04-21 DIAGNOSIS — R0982 Postnasal drip: Secondary | ICD-10-CM | POA: Diagnosis not present

## 2014-04-21 DIAGNOSIS — J387 Other diseases of larynx: Secondary | ICD-10-CM | POA: Diagnosis not present

## 2014-06-01 ENCOUNTER — Encounter (HOSPITAL_COMMUNITY): Payer: Self-pay | Admitting: *Deleted

## 2014-06-02 ENCOUNTER — Encounter: Payer: Self-pay | Admitting: Cardiology

## 2014-06-15 ENCOUNTER — Ambulatory Visit (HOSPITAL_COMMUNITY): Payer: Medicare Other | Admitting: Anesthesiology

## 2014-06-15 ENCOUNTER — Ambulatory Visit (HOSPITAL_COMMUNITY)
Admission: RE | Admit: 2014-06-15 | Discharge: 2014-06-15 | Disposition: A | Discharge status: Home / Self Care | Payer: Medicare Other | Source: Ambulatory Visit | Attending: Gastroenterology | Admitting: Gastroenterology

## 2014-06-15 ENCOUNTER — Encounter (HOSPITAL_COMMUNITY): Payer: Self-pay | Admitting: *Deleted

## 2014-06-15 ENCOUNTER — Encounter (HOSPITAL_COMMUNITY)
Admission: RE | Disposition: A | Discharge status: Home / Self Care | Payer: Self-pay | Source: Ambulatory Visit | Attending: Gastroenterology

## 2014-06-15 DIAGNOSIS — G4733 Obstructive sleep apnea (adult) (pediatric): Secondary | ICD-10-CM | POA: Diagnosis not present

## 2014-06-15 DIAGNOSIS — E780 Pure hypercholesterolemia: Secondary | ICD-10-CM | POA: Diagnosis not present

## 2014-06-15 DIAGNOSIS — Z1211 Encounter for screening for malignant neoplasm of colon: Secondary | ICD-10-CM | POA: Diagnosis not present

## 2014-06-15 DIAGNOSIS — E039 Hypothyroidism, unspecified: Secondary | ICD-10-CM | POA: Insufficient documentation

## 2014-06-15 HISTORY — PX: COLONOSCOPY WITH PROPOFOL: SHX5780

## 2014-06-15 HISTORY — DX: Personal history of other specified conditions: Z87.898

## 2014-06-15 HISTORY — DX: Headache: R51

## 2014-06-15 HISTORY — DX: Unspecified osteoarthritis, unspecified site: M19.90

## 2014-06-15 HISTORY — DX: Gastro-esophageal reflux disease without esophagitis: K21.9

## 2014-06-15 HISTORY — DX: Personal history of other medical treatment: Z92.89

## 2014-06-15 HISTORY — DX: Headache, unspecified: R51.9

## 2014-06-15 HISTORY — DX: Hypothyroidism, unspecified: E03.9

## 2014-06-15 SURGERY — COLONOSCOPY WITH PROPOFOL
Anesthesia: Monitor Anesthesia Care

## 2014-06-15 MED ORDER — PROPOFOL 10 MG/ML IV BOLUS
INTRAVENOUS | Status: AC
  Filled 2014-06-15: qty 20

## 2014-06-15 MED ORDER — SODIUM CHLORIDE 0.9 % IV SOLN: INTRAVENOUS | Status: DC

## 2014-06-15 MED ORDER — LACTATED RINGERS IV SOLN
INTRAVENOUS | Status: DC
  Administered 2014-06-15: 1000 mL via INTRAVENOUS

## 2014-06-15 MED ORDER — PROPOFOL 10 MG/ML IV BOLUS
INTRAVENOUS | Status: DC | PRN
  Administered 2014-06-15: 40 mg via INTRAVENOUS
  Administered 2014-06-15 (×3): 20 mg via INTRAVENOUS
  Administered 2014-06-15: 10 mg via INTRAVENOUS
  Administered 2014-06-15 (×9): 20 mg via INTRAVENOUS
  Administered 2014-06-15: 50 mg via INTRAVENOUS
  Administered 2014-06-15 (×2): 20 mg via INTRAVENOUS

## 2014-06-15 SURGICAL SUPPLY — 22 items

## 2014-06-15 NOTE — Discharge Instructions (Signed)
Colonoscopy, Care After °These instructions give you information on caring for yourself after your procedure. Your doctor may also give you more specific instructions. Call your doctor if you have any problems or questions after your procedure. °HOME CARE °· Do not drive for 24 hours. °· Do not sign important papers or use machinery for 24 hours. °· You may shower. °· You may go back to your usual activities, but go slower for the first 24 hours. °· Take rest breaks often during the first 24 hours. °· Walk around or use warm packs on your belly (abdomen) if you have belly cramping or gas. °· Drink enough fluids to keep your pee (urine) clear or pale yellow. °· Resume your normal diet. Avoid heavy or fried foods. °· Avoid drinking alcohol for 24 hours or as told by your doctor. °· Only take medicines as told by your doctor. °If a tissue sample (biopsy) was taken during the procedure:  °· Do not take aspirin or blood thinners for 7 days, or as told by your doctor. °· Do not drink alcohol for 7 days, or as told by your doctor. °· Eat soft foods for the first 24 hours. °GET HELP IF: °You still have a small amount of blood in your poop (stool) 2-3 days after the procedure. °GET HELP RIGHT AWAY IF: °· You have more than a small amount of blood in your poop. °· You see clumps of tissue (blood clots) in your poop. °· Your belly is puffy (swollen). °· You feel sick to your stomach (nauseous) or throw up (vomit). °· You have a fever. °· You have belly pain that gets worse and medicine does not help. °MAKE SURE YOU: °· Understand these instructions. °· Will watch your condition. °· Will get help right away if you are not doing well or get worse. °Document Released: 03/10/2010 Document Revised: 02/10/2013 Document Reviewed: 10/13/2012 °ExitCare® Patient Information ©2015 [employer]. This information is not intended to replace advice given to you by your health care provider. Make sure you discuss any questions you have with  your health care provider. ° °

## 2014-06-15 NOTE — Anesthesia Preprocedure Evaluation (Signed)
Anesthesia Evaluation  Patient identified by MRN, date of birth, ID band Patient awake    Reviewed: Allergy & Precautions, NPO status , Patient's Chart, lab work & pertinent test results  Airway Mallampati: II   Neck ROM: full    Dental   Pulmonary sleep apnea , former smoker,  breath sounds clear to auscultation        Cardiovascular hypertension, Rhythm:regular Rate:Normal     Neuro/Psych  Headaches,    GI/Hepatic GERD-  ,  Endo/Other  Hypothyroidism Hyperthyroidism Morbid obesity  Renal/GU      Musculoskeletal  (+) Arthritis -,   Abdominal   Peds  Hematology   Anesthesia Other Findings   Reproductive/Obstetrics                             Anesthesia Physical Anesthesia Plan  ASA: II  Anesthesia Plan: MAC   Post-op Pain Management:    Induction: Intravenous  Airway Management Planned: Nasal Cannula  Additional Equipment:   Intra-op Plan:   Post-operative Plan:   Informed Consent: I have reviewed the patients History and Physical, chart, labs and discussed the procedure including the risks, benefits and alternatives for the proposed anesthesia with the patient or authorized representative who has indicated his/her understanding and acceptance.     Plan Discussed with: CRNA, Anesthesiologist and Surgeon  Anesthesia Plan Comments:         Anesthesia Quick Evaluation

## 2014-06-15 NOTE — Transfer of Care (Signed)
Immediate Anesthesia Transfer of Care Note  Patient: Cassandra Ho  Procedure(s) Performed: Procedure(s): COLONOSCOPY WITH PROPOFOL (N/A)  Patient Location: PACU  Anesthesia Type:MAC  Level of Consciousness: sedated  Airway & Oxygen Therapy: Patient Spontanous Breathing and Patient connected to nasal cannula oxygen  Post-op Assessment: Report given to RN and Post -op Vital signs reviewed and stable  Post vital signs: Reviewed and stable  Last Vitals:  Filed Vitals:   06/15/14 1259  BP: 158/73  Pulse: 79  Temp: 36.8 C  Resp: 20    Complications: No apparent anesthesia complications

## 2014-06-15 NOTE — Op Note (Signed)
Procedure: Screening colonoscopy. Normal screening colonoscopy performed on 03/05/2001  Endoscopist: Earle Gell  Premedication: Propofol administered by anesthesia  Procedure: The patient was placed in the left lateral decubitus position. Anal inspection and digital rectal exam were normal. The Pentax pediatric colonoscope was introduced into the rectum and advanced to the cecum. A normal-appearing appendiceal orifice was identified. A normal-appearing ileocecal valve was identified. Colonic preparation for the exam today was good. Withdrawal time was 10 minutes  Rectum. Normal. Retroflexed view of the distal rectum was normal  Sigmoid colon and descending colon. Normal  Splenic flexure. Normal  Transverse colon. Normal  Hepatic flexure. Normal  Ascending colon. Normal  Cecum and ileocecal valve. Normal  Assessment: Normal screening colonoscopy.

## 2014-06-15 NOTE — H&P (Signed)
  Procedure: Screening colonoscopy. Normal screening colonoscopy performed on 03/05/2001  History: The patient is a 69 year old female born 10/27/1945. He is scheduled to undergo a screening colonoscopy with polypectomy to prevent colon cancer.  Medication allergies: None  Past medical history: Hypercholesterolemia. Allergic rhinitis. Post ablative hypothyroidism. Solitary toxic thyroid nodule ablated with radioactive iodine. Obstructive sleep apnea syndrome. Pathological fracture of the right hip. Meniscal tear of the right knee. Left ankle surgery. Bilateral carpal tunnel release surgeries.  Exam: The patient is alert and lying comfortably on the endoscopy stretcher. Abdomen soft and nontender to palpation. Lungs are clear to auscultation. Cardiac exam reveals a regular rhythm.  Plan proceed with screening colonoscopy

## 2014-06-16 ENCOUNTER — Encounter (HOSPITAL_COMMUNITY): Payer: Self-pay | Admitting: Gastroenterology

## 2014-06-16 NOTE — Anesthesia Postprocedure Evaluation (Signed)
Anesthesia Post Note  Patient: Cassandra Ho  Procedure(s) Performed: Procedure(s) (LRB): COLONOSCOPY WITH PROPOFOL (N/A)  Anesthesia type: MAC  Patient location: PACU  Post pain: Pain level controlled and Adequate analgesia  Post assessment: Post-op Vital signs reviewed, Patient's Cardiovascular Status Stable and Respiratory Function Stable  Last Vitals:  Filed Vitals:   06/15/14 1405  BP:   Pulse: 81  Temp:   Resp: 18    Post vital signs: Reviewed and stable  Level of consciousness: awake, alert  and oriented  Complications: No apparent anesthesia complications

## 2014-06-25 ENCOUNTER — Other Ambulatory Visit: Payer: Self-pay | Admitting: Cardiology

## 2014-08-16 ENCOUNTER — Other Ambulatory Visit: Payer: Self-pay

## 2014-08-26 ENCOUNTER — Other Ambulatory Visit: Payer: Self-pay

## 2014-08-26 ENCOUNTER — Other Ambulatory Visit: Payer: Self-pay | Admitting: Cardiology

## 2014-08-26 MED ORDER — ROSUVASTATIN CALCIUM 20 MG PO TABS: ORAL_TABLET | ORAL | Status: DC

## 2014-09-29 ENCOUNTER — Encounter: Payer: Self-pay | Admitting: Cardiology

## 2014-11-08 DIAGNOSIS — Z23 Encounter for immunization: Secondary | ICD-10-CM | POA: Diagnosis not present

## 2014-12-10 DIAGNOSIS — L820 Inflamed seborrheic keratosis: Secondary | ICD-10-CM | POA: Diagnosis not present

## 2014-12-10 DIAGNOSIS — L814 Other melanin hyperpigmentation: Secondary | ICD-10-CM | POA: Diagnosis not present

## 2014-12-10 DIAGNOSIS — L821 Other seborrheic keratosis: Secondary | ICD-10-CM | POA: Diagnosis not present

## 2014-12-10 DIAGNOSIS — D225 Melanocytic nevi of trunk: Secondary | ICD-10-CM | POA: Diagnosis not present

## 2014-12-10 DIAGNOSIS — D1801 Hemangioma of skin and subcutaneous tissue: Secondary | ICD-10-CM | POA: Diagnosis not present

## 2014-12-16 ENCOUNTER — Other Ambulatory Visit (HOSPITAL_COMMUNITY)
Admission: RE | Admit: 2014-12-16 | Discharge: 2014-12-16 | Disposition: A | Discharge status: Home / Self Care | Payer: Medicare Other | Source: Ambulatory Visit | Attending: Obstetrics and Gynecology | Admitting: Obstetrics and Gynecology

## 2014-12-16 ENCOUNTER — Other Ambulatory Visit: Payer: Self-pay | Admitting: Nurse Practitioner

## 2014-12-16 DIAGNOSIS — N898 Other specified noninflammatory disorders of vagina: Secondary | ICD-10-CM | POA: Diagnosis not present

## 2014-12-16 DIAGNOSIS — N393 Stress incontinence (female) (male): Secondary | ICD-10-CM | POA: Diagnosis not present

## 2014-12-16 DIAGNOSIS — Z124 Encounter for screening for malignant neoplasm of cervix: Secondary | ICD-10-CM | POA: Diagnosis not present

## 2014-12-17 LAB — CYTOLOGY - PAP

## 2014-12-24 ENCOUNTER — Other Ambulatory Visit: Payer: Self-pay

## 2014-12-24 DIAGNOSIS — Z1231 Encounter for screening mammogram for malignant neoplasm of breast: Secondary | ICD-10-CM

## 2015-01-17 DIAGNOSIS — E890 Postprocedural hypothyroidism: Secondary | ICD-10-CM | POA: Diagnosis not present

## 2015-01-18 DIAGNOSIS — N393 Stress incontinence (female) (male): Secondary | ICD-10-CM | POA: Diagnosis not present

## 2015-01-19 DIAGNOSIS — Z13820 Encounter for screening for osteoporosis: Secondary | ICD-10-CM | POA: Diagnosis not present

## 2015-01-19 DIAGNOSIS — Z1211 Encounter for screening for malignant neoplasm of colon: Secondary | ICD-10-CM | POA: Diagnosis not present

## 2015-01-19 DIAGNOSIS — E890 Postprocedural hypothyroidism: Secondary | ICD-10-CM | POA: Diagnosis not present

## 2015-01-27 ENCOUNTER — Ambulatory Visit
Admission: RE | Admit: 2015-01-27 | Discharge: 2015-01-27 | Disposition: A | Discharge status: Home / Self Care | Payer: Medicare Other | Source: Ambulatory Visit

## 2015-01-27 DIAGNOSIS — Z1231 Encounter for screening mammogram for malignant neoplasm of breast: Secondary | ICD-10-CM

## 2015-02-01 DIAGNOSIS — R32 Unspecified urinary incontinence: Secondary | ICD-10-CM | POA: Diagnosis not present

## 2015-02-03 DIAGNOSIS — M170 Bilateral primary osteoarthritis of knee: Secondary | ICD-10-CM | POA: Diagnosis not present

## 2015-02-18 ENCOUNTER — Other Ambulatory Visit: Payer: Self-pay | Admitting: Cardiology

## 2015-02-23 DIAGNOSIS — M170 Bilateral primary osteoarthritis of knee: Secondary | ICD-10-CM | POA: Diagnosis not present

## 2015-02-25 ENCOUNTER — Ambulatory Visit: Payer: Self-pay | Admitting: Cardiology

## 2015-03-02 DIAGNOSIS — M170 Bilateral primary osteoarthritis of knee: Secondary | ICD-10-CM | POA: Diagnosis not present

## 2015-03-08 DIAGNOSIS — M170 Bilateral primary osteoarthritis of knee: Secondary | ICD-10-CM | POA: Diagnosis not present

## 2015-04-05 DIAGNOSIS — N9489 Other specified conditions associated with female genital organs and menstrual cycle: Secondary | ICD-10-CM | POA: Diagnosis not present

## 2015-04-05 DIAGNOSIS — N760 Acute vaginitis: Secondary | ICD-10-CM | POA: Diagnosis not present

## 2015-04-05 DIAGNOSIS — N393 Stress incontinence (female) (male): Secondary | ICD-10-CM | POA: Diagnosis not present

## 2015-04-21 NOTE — Progress Notes (Signed)
Cardiology Office Note   Date:  04/22/2015   ID:  Cassandra Ho, DOB 11-Dec-1945, MRN [id number]  PCP:  Delrae Rend, MD    Chief Complaint  Patient presents with  . Sleep Apnea  . Hypertension      History of Present Illness: Cassandra Ho is a 70 y.o. female with a history of OSA on CPAP and morbid obesity who presents today for followup. She is doing well. She did not tolerate CPAP therapy and got fitted for an oral device which she tolerates well. She says that her husband says she no longer snores.  She has not daytime sleepiness and feels rested when she gets up in the am. She had been riding a stationary bike 3-5 miles daily but has had some problems with her knees and had gel injections so she stopped and has gained 16 lbs. She is going to start back exercising.  She denies any chest pain, SOB, DOE, palpitations or dizziness. She has chronic LLE edema from prior injury which is stable.     Past Medical History  Diagnosis Date  . Morbid obesity ([place])   . Dyslipidemia   . Allergic rhinitis   . Hypothyroidism (acquired)     Post-ablation  . Toxic solitary thyroid nodule     radioactive iodine therapy with 31.4 mCi of I-131 on 12/05/2007  . Palpitations   . OSA (obstructive sleep apnea)     intolerant to CPAP and now using an oral device  . Hypothyroidism   . Hypertension   . GERD (gastroesophageal reflux disease)   . Headache     migraines in past  . Transfusion history     with hip surgery  . Arthritis     generalized-ankles and knees  . H/O: pituitary tumor     "irradicated" no problems now  . OSA (obstructive sleep apnea) 10/16/2013    Past Surgical History  Procedure Laterality Date  . Carpal tunnel release Bilateral   . Knee arthroscopy Right   . Breast surgery      x2 biopsies(benign)  . Ankle arthrotomy Left     ankle and foot reconstruction ;retained hardware  . Hip fracture surgery      repaired with cader bone and  metal plates  . Colonoscopy with propofol N/A 06/15/2014    Procedure: COLONOSCOPY WITH PROPOFOL;  Surgeon: Garlan Fair, MD;  Location: [facility];  Service: Endoscopy;  Laterality: N/A;     Current Outpatient Prescriptions  Medication Sig Dispense Refill  . aspirin 81 MG tablet Take 81 mg by mouth daily.    . Calcium Carbonate-Vit D-Min (CALCIUM 1200 PO) Take 1 tablet by mouth daily.    . diphenhydramine-acetaminophen (TYLENOL PM) 25-500 MG TABS Take 1 tablet by mouth at bedtime as needed.    . fenofibrate 160 MG tablet Take 1 tablet by mouth  daily with food 90 tablet 3  . FLUZONE HIGH-DOSE 0.5 ML SUSY   0  . levothyroxine (SYNTHROID, LEVOTHROID) 150 MCG tablet Take 150 mcg by mouth daily before breakfast.    . losartan-hydrochlorothiazide (HYZAAR) 50-12.5 MG per tablet Take 1 tablet by mouth once a day 90 tablet 3  . Omeprazole-Sodium Bicarbonate (ZEGERID) 20-1100 MG CAPS capsule Take 1 capsule by mouth as needed (for acid reflux).     Marland Kitchen OVER THE COUNTER MEDICATION Ultimate OMEGA 3 tablets once a day    . rosuvastatin (  CRESTOR) 20 MG tablet Take 1 tablet by mouth once a day 90 tablet 0   No current facility-administered medications for this visit.    Allergies:   Review of patient's allergies indicates no known allergies.    Social History:  The patient  reports that she has quit smoking. She does not have any smokeless tobacco history on file. She reports that she drinks alcohol. She reports that she does not use illicit drugs.   Family History:  The patient's family history includes Alcoholism in her father; Breast cancer in her sister; Cirrhosis in her father; Fibromyalgia in her sister; Lung cancer in her mother; Melanoma in her mother; Thyroid cancer in her mother.    ROS:  Please see the history of present illness.   Otherwise, review of systems are positive for none.   All other systems are reviewed and negative.    PHYSICAL EXAM: VS:  BP 132/82 mmHg  Pulse 90  Ht  5\' 6"  (1.676 m)  Wt 319 lb (144.697 kg)  BMI 51.51 kg/m2 , BMI Body mass index is 51.51 kg/(m^2). GEN: Well nourished, well developed, in no acute distress HEENT: normal Neck: no JVD, carotid bruits, or masses Cardiac: RRR; no murmurs, rubs, or gallops,no edema  Respiratory:  clear to auscultation bilaterally, normal work of breathing GI: soft, nontender, nondistended, + BS MS: no deformity or atrophy Skin: warm and dry, no rash Neuro:  Strength and sensation are intact Psych: euthymic mood, full affect   EKG:  EKG is not ordered today.    Recent Labs: No results found for requested labs within last 365 days.    Lipid Panel    Component Value Date/Time   CHOL 126 12/28/2013 0901   CHOL 127 05/11/2013 0819   TRIG 139.0 12/28/2013 0901   TRIG 144 05/11/2013 0819   HDL 37.20* 12/28/2013 0901   HDL 49 05/11/2013 0819   CHOLHDL 3 12/28/2013 0901   VLDL 27.8 12/28/2013 0901   LDLCALC 61 12/28/2013 0901   LDLCALC 49 05/11/2013 0819      Wt Readings from Last 3 Encounters:  04/22/15 319 lb (144.697 kg)  06/15/14 303 lb (137.44 kg)  12/22/13 303 lb 9.6 oz (137.712 kg)     ASSESSMENT AND PLAN: 1. OSA - she did not tolerate her CPAP and is now using an oral device.  2. Obesity - she is walking 3-5 miles daily on stationary bike 3. Dyslipidemia - continue crestor and recheck NMR lipid panel and ALT 4. HTN -  controlled - continue Hyzaar     Current medicines are reviewed at length with the patient today.  The patient does not have concerns regarding medicines.  The following changes have been made:  no change  Labs/ tests ordered today: See above Assessment and Plan No orders of the defined types were placed in this encounter.     Disposition:   FU with me in 1 year  Signed, Sueanne Margarita, MD  04/22/2015 8:40 AM    [facility] [place], [place], [place]  [address] Phone: [phone]; Fax: [phone]

## 2015-04-22 ENCOUNTER — Ambulatory Visit (INDEPENDENT_AMBULATORY_CARE_PROVIDER_SITE_OTHER): Payer: Medicare Other | Admitting: Cardiology

## 2015-04-22 ENCOUNTER — Encounter: Payer: Self-pay | Admitting: Cardiology

## 2015-04-22 VITALS — BP 132/82 | HR 90 | Ht 66.0 in | Wt 319.0 lb

## 2015-04-22 DIAGNOSIS — E669 Obesity, unspecified: Secondary | ICD-10-CM

## 2015-04-22 DIAGNOSIS — G4733 Obstructive sleep apnea (adult) (pediatric): Secondary | ICD-10-CM | POA: Diagnosis not present

## 2015-04-22 DIAGNOSIS — I10 Essential (primary) hypertension: Secondary | ICD-10-CM

## 2015-04-22 LAB — HEPATIC FUNCTION PANEL
ALT: 21 U/L (ref 6–29)
AST: 18 U/L (ref 10–35)
Albumin: 4.3 g/dL (ref 3.6–5.1)
Alkaline Phosphatase: 56 U/L (ref 33–130)
BILIRUBIN DIRECT: 0.1 mg/dL (ref <=0.2)
BILIRUBIN INDIRECT: 0.3 mg/dL (ref 0.2–1.2)
TOTAL PROTEIN: 6.5 g/dL (ref 6.1–8.1)
Total Bilirubin: 0.4 mg/dL (ref 0.2–1.2)

## 2015-04-22 LAB — BASIC METABOLIC PANEL
BUN: 23 mg/dL (ref 7–25)
CO2: 28 mmol/L (ref 20–31)
CREATININE: 0.64 mg/dL (ref 0.50–0.99)
Calcium: 9.5 mg/dL (ref 8.6–10.4)
Chloride: 105 mmol/L (ref 98–110)
Glucose, Bld: 103 mg/dL — ABNORMAL HIGH (ref 65–99)
Potassium: 4.4 mmol/L (ref 3.5–5.3)
Sodium: 143 mmol/L (ref 135–146)

## 2015-04-22 LAB — LIPID PANEL
Cholesterol: 119 mg/dL — ABNORMAL LOW (ref 125–200)
HDL: 43 mg/dL — AB (ref >=46)
LDL CALC: 45 mg/dL (ref <130)
TRIGLYCERIDES: 155 mg/dL — AB (ref <150)
Total CHOL/HDL Ratio: 2.8 Ratio (ref <=5.0)
VLDL: 31 mg/dL — ABNORMAL HIGH (ref <30)

## 2015-04-22 NOTE — Patient Instructions (Signed)
Medication Instructions:  None  Labwork: BMET, LFTs, Lipids today  Testing/Procedures: None  Follow-Up: Your physician wants you to follow-up in: 1 year with Dr. Radford Pax. You will receive a reminder letter in the mail two months in advance. If you don't receive a letter, please call our office to schedule the follow-up appointment.   Any Other Special Instructions Will Be Listed Below (If Applicable).     If you need a refill on your cardiac medications before your next appointment, please call your pharmacy.

## 2015-04-28 DIAGNOSIS — M170 Bilateral primary osteoarthritis of knee: Secondary | ICD-10-CM | POA: Diagnosis not present

## 2015-05-09 ENCOUNTER — Other Ambulatory Visit: Payer: Self-pay | Admitting: Cardiology

## 2015-05-09 MED ORDER — ROSUVASTATIN CALCIUM 20 MG PO TABS: 20.0000 mg | ORAL_TABLET | Freq: Every day | ORAL | Status: DC

## 2015-05-09 MED ORDER — LOSARTAN POTASSIUM-HCTZ 50-12.5 MG PO TABS: 1.0000 | ORAL_TABLET | Freq: Every day | ORAL | Status: DC

## 2015-05-09 MED ORDER — FENOFIBRATE 160 MG PO TABS: 160.0000 mg | ORAL_TABLET | Freq: Every day | ORAL | Status: DC

## 2015-06-07 DIAGNOSIS — M25562 Pain in left knee: Secondary | ICD-10-CM | POA: Diagnosis not present

## 2015-06-07 DIAGNOSIS — M170 Bilateral primary osteoarthritis of knee: Secondary | ICD-10-CM | POA: Diagnosis not present

## 2015-10-05 DIAGNOSIS — M170 Bilateral primary osteoarthritis of knee: Secondary | ICD-10-CM | POA: Diagnosis not present

## 2015-10-12 DIAGNOSIS — M170 Bilateral primary osteoarthritis of knee: Secondary | ICD-10-CM | POA: Diagnosis not present

## 2015-10-19 DIAGNOSIS — M170 Bilateral primary osteoarthritis of knee: Secondary | ICD-10-CM | POA: Diagnosis not present

## 2015-10-19 DIAGNOSIS — M1711 Unilateral primary osteoarthritis, right knee: Secondary | ICD-10-CM | POA: Diagnosis not present

## 2015-11-07 DIAGNOSIS — Z23 Encounter for immunization: Secondary | ICD-10-CM | POA: Diagnosis not present

## 2016-01-24 ENCOUNTER — Other Ambulatory Visit: Payer: Self-pay | Admitting: Cardiology

## 2016-01-24 DIAGNOSIS — Z1231 Encounter for screening mammogram for malignant neoplasm of breast: Secondary | ICD-10-CM

## 2016-02-16 ENCOUNTER — Ambulatory Visit
Admission: RE | Admit: 2016-02-16 | Discharge: 2016-02-16 | Disposition: A | Discharge status: Home / Self Care | Payer: Medicare Other | Source: Ambulatory Visit | Attending: Cardiology | Admitting: Cardiology

## 2016-02-16 DIAGNOSIS — Z1231 Encounter for screening mammogram for malignant neoplasm of breast: Secondary | ICD-10-CM

## 2016-02-29 DIAGNOSIS — D1801 Hemangioma of skin and subcutaneous tissue: Secondary | ICD-10-CM | POA: Diagnosis not present

## 2016-02-29 DIAGNOSIS — L814 Other melanin hyperpigmentation: Secondary | ICD-10-CM | POA: Diagnosis not present

## 2016-02-29 DIAGNOSIS — L821 Other seborrheic keratosis: Secondary | ICD-10-CM | POA: Diagnosis not present

## 2016-02-29 DIAGNOSIS — D2261 Melanocytic nevi of right upper limb, including shoulder: Secondary | ICD-10-CM | POA: Diagnosis not present

## 2016-02-29 DIAGNOSIS — L918 Other hypertrophic disorders of the skin: Secondary | ICD-10-CM | POA: Diagnosis not present

## 2016-02-29 DIAGNOSIS — L820 Inflamed seborrheic keratosis: Secondary | ICD-10-CM | POA: Diagnosis not present

## 2016-04-20 ENCOUNTER — Encounter: Payer: Self-pay | Admitting: Cardiology

## 2016-04-30 ENCOUNTER — Ambulatory Visit: Payer: Self-pay | Admitting: Cardiology

## 2016-05-30 DIAGNOSIS — M1711 Unilateral primary osteoarthritis, right knee: Secondary | ICD-10-CM | POA: Diagnosis not present

## 2016-05-30 DIAGNOSIS — M1712 Unilateral primary osteoarthritis, left knee: Secondary | ICD-10-CM | POA: Diagnosis not present

## 2016-05-30 DIAGNOSIS — M170 Bilateral primary osteoarthritis of knee: Secondary | ICD-10-CM | POA: Diagnosis not present

## 2016-06-06 DIAGNOSIS — M170 Bilateral primary osteoarthritis of knee: Secondary | ICD-10-CM | POA: Diagnosis not present

## 2016-06-10 NOTE — Progress Notes (Signed)
Cardiology Office Note    Date:  06/11/2016   ID:  Cassandra Ho, DOB 1945-11-16, MRN [id number]  PCP:  Delrae Rend, MD  Cardiologist:  Fransico Him, MD   Chief Complaint  Patient presents with  . Sleep Apnea  . Hypertension  . Hyperlipidemia  . Medication Refill    History of Present Illness:  Cassandra Ho is a 71 y.o. female with a history of OSA on CPAP, HTN, hyperlipidemia and morbid obesity.  She is here today for followup and is doing well. She did not tolerate CPAP therapy and got fitted for an oral device which she continues to do well with. She says that her husband says she no longer snores.  She says that her daytime sleepiness has resolved and feels rested when she gets up in the am. Her exercise has been limited due to problems with her knees and is getting another gel injection.  Dr. Maureen Ralphs does not want to do a knee replacement until she loses weight. I have encouraged her to try to get back on her stationary bike. She has cut out sugar and carbs.  She denies any chest pain, SOB, DOE, palpitations or dizziness. She has chronic LLE edema from prior injury which is stable.     Past Medical History:  Diagnosis Date  . Allergic rhinitis   . Arthritis    generalized-ankles and knees  . Dyslipidemia   . GERD (gastroesophageal reflux disease)   . H/O: pituitary tumor    "irradicated" no problems now  . Headache    migraines in past  . Hypertension   . Hypothyroidism   . Hypothyroidism (acquired)    Post-ablation  . Morbid obesity ([place])   . OSA (obstructive sleep apnea)    intolerant to CPAP and now using an oral device  . OSA (obstructive sleep apnea) 10/16/2013  . Palpitations   . 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 with 31.4 mCi of I-131 on 12/05/2007  . Transfusion history    with hip surgery    Past Surgical History:  Procedure Laterality Date  . ANKLE ARTHROTOMY Left    ankle and foot reconstruction ;retained  hardware  . BREAST SURGERY     x2 biopsies(benign)  . CARPAL TUNNEL RELEASE Bilateral   . COLONOSCOPY WITH PROPOFOL N/A 06/15/2014   Procedure: COLONOSCOPY WITH PROPOFOL;  Surgeon: Garlan Fair, MD;  Location: [facility];  Service: Endoscopy;  Laterality: N/A;  . HIP FRACTURE SURGERY     repaired with cader bone and metal plates  . KNEE ARTHROSCOPY Right     Current Medications: Current Meds  Medication Sig  . aspirin 81 MG tablet Take 81 mg by mouth daily.  . Calcium Carbonate-Vit D-Min (CALCIUM 1200 PO) Take 1 tablet by mouth daily.  . diphenhydramine-acetaminophen (TYLENOL PM) 25-500 MG TABS Take 1 tablet by mouth at bedtime as needed.  . fenofibrate 160 MG tablet Take 1 tablet (160 mg total) by mouth daily.  Marland Kitchen FLUZONE HIGH-DOSE 0.5 ML SUSY   . levothyroxine (SYNTHROID, LEVOTHROID) 150 MCG tablet Take 150 mcg by mouth daily before breakfast.  . losartan-hydrochlorothiazide (HYZAAR) 50-12.5 MG tablet Take 1 tablet by mouth daily.  Earney Navy Bicarbonate (ZEGERID) 20-1100 MG CAPS capsule Take 1 capsule by mouth as needed (for acid reflux).   Marland Kitchen OVER THE COUNTER MEDICATION Ultimate OMEGA 3 tablets once a day  . rosuvastatin (CRESTOR) 20 MG tablet Take 1 tablet (20 mg total) by mouth daily.  Allergies:   Patient has no known allergies.   Social History   Social History  . Marital status: Unknown    Spouse name: N/A  . Number of children: N/A  . Years of education: N/A   Social History Main Topics  . Smoking status: Former Smoker    Years: 3.00  . Smokeless tobacco: Never Used     Comment: Quit '78  . Alcohol use Yes     Comment: ocass  . Drug use: No  . Sexual activity: Not Asked   Other Topics Concern  . None   Social History Narrative   ** Merged History Encounter **         Family History:  The patient's family history includes Alcoholism in her father; Breast cancer in her sister; Cirrhosis in her father; Fibromyalgia in her sister; Lung cancer  in her mother; Melanoma in her mother; Thyroid cancer in her mother.   ROS:   Please see the history of present illness.    ROS All other systems reviewed and are negative.  No flowsheet data found.     PHYSICAL EXAM:   VS:  BP 134/82   Pulse 86   Ht 5\' 7"  (1.702 m)   Wt (!) 313 lb (142 kg)   SpO2 97%   BMI 49.02 kg/m    GEN: Well nourished, well developed, in no acute distress  HEENT: normal  Neck: no JVD, carotid bruits, or masses Cardiac: RRR; no murmurs, rubs, or gallops,no edema.  Intact distal pulses bilaterally.  Respiratory:  clear to auscultation bilaterally, normal work of breathing GI: soft, nontender, nondistended, + BS MS: no deformity or atrophy  Skin: warm and dry, no rash Neuro:  Alert and Oriented x 3, Strength and sensation are intact Psych: euthymic mood, full affect  Wt Readings from Last 3 Encounters:  06/11/16 (!) 313 lb (142 kg)  04/22/15 (!) 319 lb (144.7 kg)  06/15/14 (!) 303 lb (137.4 kg)      Studies/Labs Reviewed:   EKG:  EKG is not ordered today.    Recent Labs: No results found for requested labs within last 8760 hours.   Lipid Panel    Component Value Date/Time   CHOL 119 (L) 04/22/2015 0847   CHOL 127 05/11/2013 0819   TRIG 155 (H) 04/22/2015 0847   TRIG 144 05/11/2013 0819   HDL 43 (L) 04/22/2015 0847   HDL 49 05/11/2013 0819   CHOLHDL 2.8 04/22/2015 0847   VLDL 31 (H) 04/22/2015 0847   LDLCALC 45 04/22/2015 0847   LDLCALC 49 05/11/2013 0819    Additional studies/ records that were reviewed today include:  none    ASSESSMENT:    1. OSA (obstructive sleep apnea)   2. Essential hypertension, benign   3. Pure hypercholesterolemia   4. Class 2 severe obesity due to excess calories with serious comorbidity and body mass index (BMI) of 35.0 to 35.9 in adult [facility])      PLAN:  In order of problems listed above:  OSA - The patient has been using and benefiting from an oral device use and will continue to benefit from  therapy. I will get a HST to make sure her AHI is ok. HTN - BP controlled on current meds.  She will continue on ARB and diuretic 3.   Hyperlipidemia - her last LDL was 45 a year ago.  I will repeat an FLP.  She will continue on statin.  4.   Obesity - I have  encouraged him to get into a routine exercise program and cut back on carbs and portions.     Medication Adjustments/Labs and Tests Ordered: Current medicines are reviewed at length with the patient today.  Concerns regarding medicines are outlined above.  Medication changes, Labs and Tests ordered today are listed in the Patient Instructions below.  There are no Patient Instructions on file for this visit.   Signed, Fransico Him, MD  06/11/2016 8:46 AM    [address], [place], [place]  [address] Phone: [phone]; Fax: [phone]

## 2016-06-11 ENCOUNTER — Encounter: Payer: Self-pay | Admitting: Cardiology

## 2016-06-11 ENCOUNTER — Ambulatory Visit (INDEPENDENT_AMBULATORY_CARE_PROVIDER_SITE_OTHER): Payer: Medicare Other | Admitting: Cardiology

## 2016-06-11 VITALS — BP 134/82 | HR 86 | Ht 67.0 in | Wt 313.0 lb

## 2016-06-11 DIAGNOSIS — I10 Essential (primary) hypertension: Secondary | ICD-10-CM | POA: Diagnosis not present

## 2016-06-11 DIAGNOSIS — G4733 Obstructive sleep apnea (adult) (pediatric): Secondary | ICD-10-CM | POA: Diagnosis not present

## 2016-06-11 DIAGNOSIS — Z6835 Body mass index (BMI) 35.0-35.9, adult: Secondary | ICD-10-CM | POA: Diagnosis not present

## 2016-06-11 DIAGNOSIS — E7800 Pure hypercholesterolemia, unspecified: Secondary | ICD-10-CM

## 2016-06-11 MED ORDER — FENOFIBRATE 160 MG PO TABS
160.0000 mg | ORAL_TABLET | Freq: Every day | ORAL | 3 refills | Status: DC
Start: 2016-06-11 — End: 2017-05-27

## 2016-06-11 MED ORDER — LOSARTAN POTASSIUM-HCTZ 50-12.5 MG PO TABS: 1.0000 | ORAL_TABLET | Freq: Every day | ORAL | 3 refills | Status: DC

## 2016-06-11 MED ORDER — ROSUVASTATIN CALCIUM 20 MG PO TABS: 20.0000 mg | ORAL_TABLET | Freq: Every day | ORAL | 3 refills | Status: DC

## 2016-06-11 NOTE — Patient Instructions (Signed)
Medication Instructions:  Your physician recommends that you continue on your current medications as directed. Please refer to the Current Medication list given to you today.   Labwork: TODAY: BMET, LFTs, NMR  Testing/Procedures: Dr. Radford Pax has ordered a HOME SLEEP TEST for you. Gae Bon, our CPAP assistant, will call you to arrange this test once paperwork is complete.  Follow-Up: Your physician wants you to follow-up in: 1 year with Dr. Radford Pax. You will receive a reminder letter in the mail two months in advance. If you don't receive a letter, please call our office to schedule the follow-up appointment.   Any Other Special Instructions Will Be Listed Below (If Applicable).     If you need a refill on your cardiac medications before your next appointment, please call your pharmacy.

## 2016-06-13 DIAGNOSIS — M1711 Unilateral primary osteoarthritis, right knee: Secondary | ICD-10-CM | POA: Diagnosis not present

## 2016-06-13 LAB — NMR, LIPOPROFILE
Cholesterol: 116 mg/dL (ref 100–199)
HDL CHOLESTEROL BY NMR: 42 mg/dL (ref >39)
HDL Particle Number: 35.8 umol/L (ref >=30.5)
LDL PARTICLE NUMBER: 777 nmol/L (ref <1000)
LDL Size: 19.8 nm (ref >20.5)
LDL-C: 44 mg/dL (ref 0–99)
LP-IR Score: 70 — ABNORMAL HIGH (ref <=45)
SMALL LDL PARTICLE NUMBER: 555 nmol/L — AB (ref <=527)
Triglycerides by NMR: 149 mg/dL (ref 0–149)

## 2016-06-13 LAB — BASIC METABOLIC PANEL
BUN/Creatinine Ratio: 35 — ABNORMAL HIGH (ref 12–28)
BUN: 19 mg/dL (ref 8–27)
CHLORIDE: 105 mmol/L (ref 96–106)
CO2: 22 mmol/L (ref 18–29)
Calcium: 9.6 mg/dL (ref 8.7–10.3)
Creatinine, Ser: 0.55 mg/dL — ABNORMAL LOW (ref 0.57–1.00)
GFR calc non Af Amer: 96 mL/min/{1.73_m2} (ref >59)
GFR, EST AFRICAN AMERICAN: 111 mL/min/{1.73_m2} (ref >59)
GLUCOSE: 114 mg/dL — AB (ref 65–99)
POTASSIUM: 4.6 mmol/L (ref 3.5–5.2)
Sodium: 143 mmol/L (ref 134–144)

## 2016-06-13 LAB — HEPATIC FUNCTION PANEL
ALT: 15 IU/L (ref 0–32)
AST: 12 IU/L (ref 0–40)
Albumin: 4.2 g/dL (ref 3.6–4.8)
Alkaline Phosphatase: 63 IU/L (ref 39–117)
BILIRUBIN TOTAL: 0.3 mg/dL (ref 0.0–1.2)
Bilirubin, Direct: 0.12 mg/dL (ref 0.00–0.40)
Total Protein: 6.7 g/dL (ref 6.0–8.5)

## 2016-06-13 LAB — APOLIPOPROTEIN B: Apolipoprotein B: 64 mg/dL (ref 54–133)

## 2016-06-14 ENCOUNTER — Telehealth: Payer: Self-pay | Admitting: *Deleted

## 2016-06-14 NOTE — Telephone Encounter (Signed)
Called the patient to inform her of her upcoming home sleep study. LMTCB

## 2016-06-15 NOTE — Telephone Encounter (Signed)
Patient has been notified of her upcoming home sleep study. Patient understands she will be contacted by [facility] for set up. Patient understands to call if [facility] does not contact her in a timely manner. Home sleep study ordered. Waiting for confirmation of test and results to be sent to Dr. Radford Pax

## 2016-07-12 DIAGNOSIS — N898 Other specified noninflammatory disorders of vagina: Secondary | ICD-10-CM | POA: Diagnosis not present

## 2016-07-12 DIAGNOSIS — R3989 Other symptoms and signs involving the genitourinary system: Secondary | ICD-10-CM | POA: Diagnosis not present

## 2016-07-12 DIAGNOSIS — R1900 Intra-abdominal and pelvic swelling, mass and lump, unspecified site: Secondary | ICD-10-CM | POA: Diagnosis not present

## 2016-07-24 DIAGNOSIS — R1900 Intra-abdominal and pelvic swelling, mass and lump, unspecified site: Secondary | ICD-10-CM | POA: Diagnosis not present

## 2016-08-21 NOTE — Telephone Encounter (Signed)
Fax came in from [facility] stating Patient's test was returned un-opened. Called [facility] to inquire about the test and spoke to [place] who states she will reach back out to the patient. CPAP assistant Gae Bon) also called the patient LMTCB.

## 2016-09-28 NOTE — Telephone Encounter (Signed)
Called LMTCB. 

## 2016-10-02 NOTE — Telephone Encounter (Signed)
Called LMTCB. 

## 2016-10-15 NOTE — Telephone Encounter (Signed)
Reached out to patient to see why she did not complete her home sleep study and was informed she has been out of town since the middle of May. Patient states [facility] sent her a letter stating she would be charged $1500 for the equipment so she sent everything back without taking the test.  Patient states she will take the test if Dr Radford Pax insist.

## 2016-10-16 NOTE — Telephone Encounter (Signed)
She needs the test to make sure oral device is working - her insurance should cover it

## 2016-11-16 NOTE — Telephone Encounter (Signed)
LMTCB

## 2016-11-30 ENCOUNTER — Encounter: Payer: Self-pay | Admitting: *Deleted

## 2016-11-30 NOTE — Telephone Encounter (Addendum)
LMTCB.  No contact letter sent.

## 2016-12-14 NOTE — Telephone Encounter (Signed)
Patient called today to say she has not worn her oral device in 8 or 9 months and she has had no snoring. Patient will be glad to take the home sleep test but it will be without the oral device. The oral device moves her bottom teeth and she only wore it periodically anyway.

## 2016-12-16 NOTE — Telephone Encounter (Signed)
Please findout from patient if she has OSA on home sleep study would she be willing to try CPAP again as she does not want to use the oral device

## 2016-12-17 NOTE — Telephone Encounter (Signed)
Called LMTCB. 

## 2016-12-19 DIAGNOSIS — Z23 Encounter for immunization: Secondary | ICD-10-CM | POA: Diagnosis not present

## 2017-01-18 DIAGNOSIS — M170 Bilateral primary osteoarthritis of knee: Secondary | ICD-10-CM | POA: Diagnosis not present

## 2017-01-24 ENCOUNTER — Other Ambulatory Visit: Payer: Self-pay | Admitting: Cardiology

## 2017-01-24 DIAGNOSIS — M170 Bilateral primary osteoarthritis of knee: Secondary | ICD-10-CM | POA: Diagnosis not present

## 2017-01-24 DIAGNOSIS — Z1231 Encounter for screening mammogram for malignant neoplasm of breast: Secondary | ICD-10-CM

## 2017-01-30 DIAGNOSIS — M170 Bilateral primary osteoarthritis of knee: Secondary | ICD-10-CM | POA: Diagnosis not present

## 2017-02-22 ENCOUNTER — Ambulatory Visit
Admission: RE | Admit: 2017-02-22 | Discharge: 2017-02-22 | Disposition: A | Discharge status: Home / Self Care | Payer: Medicare Other | Source: Ambulatory Visit | Attending: Cardiology | Admitting: Cardiology

## 2017-02-22 DIAGNOSIS — Z1231 Encounter for screening mammogram for malignant neoplasm of breast: Secondary | ICD-10-CM

## 2017-04-19 DIAGNOSIS — D485 Neoplasm of uncertain behavior of skin: Secondary | ICD-10-CM | POA: Diagnosis not present

## 2017-04-19 DIAGNOSIS — D1801 Hemangioma of skin and subcutaneous tissue: Secondary | ICD-10-CM | POA: Diagnosis not present

## 2017-04-19 DIAGNOSIS — L821 Other seborrheic keratosis: Secondary | ICD-10-CM | POA: Diagnosis not present

## 2017-04-19 DIAGNOSIS — L820 Inflamed seborrheic keratosis: Secondary | ICD-10-CM | POA: Diagnosis not present

## 2017-04-19 DIAGNOSIS — L814 Other melanin hyperpigmentation: Secondary | ICD-10-CM | POA: Diagnosis not present

## 2017-04-19 DIAGNOSIS — L918 Other hypertrophic disorders of the skin: Secondary | ICD-10-CM | POA: Diagnosis not present

## 2017-04-20 ENCOUNTER — Emergency Department (HOSPITAL_COMMUNITY)
Admission: EM | Admit: 2017-04-20 | Discharge: 2017-04-20 | Disposition: A | Discharge status: Home / Self Care | Payer: Medicare Other | Attending: Emergency Medicine | Admitting: Emergency Medicine

## 2017-04-20 ENCOUNTER — Encounter (HOSPITAL_COMMUNITY): Payer: Self-pay | Admitting: Emergency Medicine

## 2017-04-20 ENCOUNTER — Emergency Department (HOSPITAL_COMMUNITY): Payer: Medicare Other

## 2017-04-20 DIAGNOSIS — Z79899 Other long term (current) drug therapy: Secondary | ICD-10-CM | POA: Diagnosis not present

## 2017-04-20 DIAGNOSIS — Y929 Unspecified place or not applicable: Secondary | ICD-10-CM | POA: Diagnosis not present

## 2017-04-20 DIAGNOSIS — M25521 Pain in right elbow: Secondary | ICD-10-CM | POA: Diagnosis not present

## 2017-04-20 DIAGNOSIS — I10 Essential (primary) hypertension: Secondary | ICD-10-CM | POA: Insufficient documentation

## 2017-04-20 DIAGNOSIS — W010XXA Fall on same level from slipping, tripping and stumbling without subsequent striking against object, initial encounter: Secondary | ICD-10-CM | POA: Diagnosis not present

## 2017-04-20 DIAGNOSIS — Y9389 Activity, other specified: Secondary | ICD-10-CM | POA: Diagnosis not present

## 2017-04-20 DIAGNOSIS — S52571A Other intraarticular fracture of lower end of right radius, initial encounter for closed fracture: Secondary | ICD-10-CM | POA: Insufficient documentation

## 2017-04-20 DIAGNOSIS — Y999 Unspecified external cause status: Secondary | ICD-10-CM | POA: Diagnosis not present

## 2017-04-20 DIAGNOSIS — Z7982 Long term (current) use of aspirin: Secondary | ICD-10-CM | POA: Diagnosis not present

## 2017-04-20 DIAGNOSIS — Z87891 Personal history of nicotine dependence: Secondary | ICD-10-CM | POA: Diagnosis not present

## 2017-04-20 DIAGNOSIS — E039 Hypothyroidism, unspecified: Secondary | ICD-10-CM | POA: Insufficient documentation

## 2017-04-20 DIAGNOSIS — S59901A Unspecified injury of right elbow, initial encounter: Secondary | ICD-10-CM | POA: Diagnosis not present

## 2017-04-20 DIAGNOSIS — S6981XA Other specified injuries of right wrist, hand and finger(s), initial encounter: Secondary | ICD-10-CM | POA: Diagnosis present

## 2017-04-20 MED ORDER — HYDROCODONE-ACETAMINOPHEN 5-325 MG PO TABS: 1.0000 | ORAL_TABLET | Freq: Four times a day (QID) | ORAL | 0 refills | Status: DC | PRN

## 2017-04-20 MED ORDER — HYDROCODONE-ACETAMINOPHEN 5-325 MG PO TABS
2.0000 | ORAL_TABLET | Freq: Once | ORAL | Status: AC
  Administered 2017-04-20: 2 via ORAL
  Filled 2017-04-20: qty 2

## 2017-04-20 NOTE — Discharge Instructions (Signed)
Please see the information and instructions below regarding your visit.  Your diagnoses today include:  1. Other closed intra-articular fracture of distal end of right radius, initial encounter     Tests performed today include: See side panel of your discharge paperwork for testing performed today. Vital signs are listed at the bottom of these instructions.   Medications prescribed:    Take any prescribed medications only as prescribed, and any over the counter medications only as directed on the packaging.  You have been prescribed Norco for pain. This is an opioid pain medication. You may take this medication every 4-6 hours as needed for pain. Only take this medication if you need it for breakthrough pain.   Do not combine this medication with Tylenol, as it may increase the risk of liver problems.  Do not combine this medication with alcohol.  Please be advised to avoid driving or operating heavy machinery while taking this medication, as it may make you drowsy or impair judgment.   Home care instructions:  Please follow any educational materials contained in this packet.   Follow-up instructions:  Please follow up with Dr. Amedeo Plenty of Hand Surgery first thing Monday morning.  I will send him a note in the electronic medical record.  Return instructions:  Please return to the Emergency Department if you experience worsening symptoms.  Please return the emergency department if you have increasing pain, your fingers are turning white or purple, they are swelling, or you have poor capillary refill that I showed you how to assess today. Please return if you have any other emergent concerns.  Additional Information:   Your vital signs today were: BP (!) 146/92 (BP Location: Left Arm)    Pulse 95    Temp 98.2 F (36.8 C) (Oral)    Resp 18    SpO2 95%  If your blood pressure (BP) was elevated on multiple readings during this visit above 130 for the top number or above 80 for the  bottom number, please have this repeated by your primary care provider within one month. --------------  Thank you for allowing [place] to participate in your care today.

## 2017-04-20 NOTE — ED Provider Notes (Signed)
[facility] DEPT Provider Note   CSN: [id number] Arrival date & time: 04/20/17  1807     History   Chief Complaint Chief Complaint  Patient presents with  . Wrist Injury    HPI Cassandra Ho is a 72 y.o. female.  HPI  Patient is a 72 year old female with past medical history as below presenting for a fall on outstretched hand and subsequent right wrist injury.  Patient reports that she was taking her coat off when she got it caught on her ankle and tripped.  Patient caught herself with her right hand.  Patient does note that she did bump her head against a concrete post she was catching herself.  Patient denies loss of consciousness, visual changes, retrograde amnesia, distal paresthesias, or subsequent headaches.  No contusion sustained from this.  Patient does not take blood thinning medications.  Patient reports that she subsequent to injury to her right wrist, she has noted swelling, but no loss of sensation distal to her right wrist.  Patient reports she is generally sore but sustained no other trauma.  Ice applied to wrist prior to arrival.  Past Medical History:  Diagnosis Date  . Allergic rhinitis   . Arthritis    generalized-ankles and knees  . Dyslipidemia   . GERD (gastroesophageal reflux disease)   . H/O: pituitary tumor    "irradicated" no problems now  . Headache    migraines in past  . Hypertension   . Hypothyroidism   . Hypothyroidism (acquired)    Post-ablation  . Morbid obesity ([place])   . OSA (obstructive sleep apnea)    intolerant to CPAP and now using an oral device  . OSA (obstructive sleep apnea) 10/16/2013  . Palpitations   . 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 with 31.4 mCi of I-131 on 12/05/2007  . Transfusion history    with hip surgery    Patient Active Problem List   Diagnosis Date Noted  . OSA (obstructive sleep apnea) 10/16/2013  . Pure hypercholesterolemia 10/16/2013  . Obesity  10/16/2013  . Essential hypertension, benign 10/16/2013  . Encounter for long-term (current) use of other medications 10/16/2013    Past Surgical History:  Procedure Laterality Date  . ANKLE ARTHROTOMY Left    ankle and foot reconstruction ;retained hardware  . BREAST BIOPSY Bilateral    Many years ago, no scar seen   . BREAST SURGERY     x2 biopsies(benign)  . CARPAL TUNNEL RELEASE Bilateral   . COLONOSCOPY WITH PROPOFOL N/A 06/15/2014   Procedure: COLONOSCOPY WITH PROPOFOL;  Surgeon: Garlan Fair, MD;  Location: [facility];  Service: Endoscopy;  Laterality: N/A;  . HIP FRACTURE SURGERY     repaired with cader bone and metal plates  . KNEE ARTHROSCOPY Right     OB History    Gravida Para Term Preterm AB Living   0 0 0 0 0     SAB TAB Ectopic Multiple Live Births   0 0 0           Home Medications    Prior to Admission medications   Medication Sig Start Date End Date Taking? Authorizing Provider  aspirin 81 MG tablet Take 81 mg by mouth daily.    [provider]  Calcium Carbonate-Vit D-Min (CALCIUM 1200 PO) Take 1 tablet by mouth daily.    [provider]  diphenhydramine-acetaminophen (TYLENOL PM) 25-500 MG TABS Take 1 tablet by mouth at bedtime as needed.  [provider]  fenofibrate 160 MG tablet Take 1 tablet (160 mg total) by mouth daily. 06/11/16   Sueanne Margarita, MD  FLUZONE HIGH-DOSE 0.5 ML SUSY  11/19/13   [provider]  levothyroxine (SYNTHROID, LEVOTHROID) 150 MCG tablet Take 150 mcg by mouth daily before breakfast.    [provider]  losartan-hydrochlorothiazide (HYZAAR) 50-12.5 MG tablet Take 1 tablet by mouth daily. 06/11/16   Sueanne Margarita, MD  Omeprazole-Sodium Bicarbonate (ZEGERID) 20-1100 MG CAPS capsule Take 1 capsule by mouth as needed (for acid reflux).     [provider]  OVER THE COUNTER MEDICATION Ultimate OMEGA 3 tablets once a day    [provider]  rosuvastatin (CRESTOR)  20 MG tablet Take 1 tablet (20 mg total) by mouth daily. 06/11/16   Sueanne Margarita, MD    Family History Family History  Problem Relation Age of Onset  . Thyroid cancer Mother   . Lung cancer Mother   . Melanoma Mother   . Alcoholism Father   . Cirrhosis Father   . Fibromyalgia Sister   . Breast cancer Sister   . Breast cancer Maternal Aunt     Social History Social History   Tobacco Use  . Smoking status: Former Smoker    Years: 3.00  . Smokeless tobacco: Never Used  . Tobacco comment: Quit '78  Substance Use Topics  . Alcohol use: Yes    Comment: ocass  . Drug use: No     Allergies   Patient has no known allergies.   Review of Systems Review of Systems  HENT: Negative for facial swelling.   Eyes: Negative for visual disturbance.  Gastrointestinal: Negative for nausea and vomiting.  Musculoskeletal: Positive for arthralgias and joint swelling. Negative for gait problem.  Skin: Positive for color change.  Neurological: Negative for syncope, weakness, numbness and headaches.  Psychiatric/Behavioral: Negative for confusion.     Physical Exam Updated Vital Signs BP (!) 146/92 (BP Location: Left Arm)   Pulse 95   Temp 98.2 F (36.8 C) (Oral)   Resp 18   SpO2 95%   Physical Exam  Constitutional: She appears well-developed and well-nourished. No distress.  Sitting comfortably in bed.  HENT:  Head: Normocephalic and atraumatic.  No contusions of the scalp.  Eyes: Conjunctivae are normal. Right eye exhibits no discharge. Left eye exhibits no discharge.  EOMs normal to gross examination.  Neck: Normal range of motion.  Cardiovascular: Normal rate, regular rhythm and normal heart sounds.  Intact, 2+ radial pulse.  Pulmonary/Chest: Effort normal and breath sounds normal.  Normal respiratory effort. Patient converses comfortably. No audible wheeze or stridor.  Abdominal: She exhibits no distension.  Musculoskeletal:  Mild tenderness to palpation of right  trapezius muscle.  No crepitus or step-off of clavicle, AC joint, or humerus. No tenderness to palpation of right elbow. Hand Exam:  Inspection: Swell and deformity of distal radius. No wound. Palpation: Point TTP of distal radius but no other TTP of metacarpals or phalanges.  Ligamentous stability: No laxity to valgus/varus stress of MCP, PIP, or DIP joints. No joint laxity with radial stress of thumb. Flexor/Extensor tendons: FDS/FDP tendons intact in digits 2-5 at PIP/DIP joints, respectively; extensor tendons intact in all digits Nerve testing: Sensation intact to sharp touch in radial, median, and ulnar nerve distributions of the right hand Vascular: 2+ radial and ulnar pulses. Capillary refill <2 seconds b/l.   Neurological: She is alert.  Mental Status:  Alert, oriented, thought content  appropriate, able to give a coherent history. Speech fluent without evidence of aphasia. Able to follow 2 step commands without difficulty.  Cranial Nerves:  II:  Peripheral visual fields grossly normal, pupils equal, round, reactive to light III,IV, VI: ptosis not present, extra-ocular motions intact bilaterally  V,VII: smile symmetric, facial light touch sensation equal VIII: hearing grossly normal to voice  X: uvula elevates symmetrically  XI: bilateral shoulder shrug symmetric and strong XII: midline tongue extension without fassiculations Motor:  Normal tone. 5/5 in upper and lower extremities. Grip strength not assessed d/t wrist injury  Gait: normal gait and balance   Skin: Skin is warm and dry. She is not diaphoretic.  Psychiatric: She has a normal mood and affect. Her behavior is normal. Judgment and thought content normal.  Nursing note and vitals reviewed.    ED Treatments / Results  Labs (all labs ordered are listed, but only abnormal results are displayed) Labs Reviewed - No data to display  EKG  EKG Interpretation None       Radiology Dg Elbow Complete Right  Result  Date: 04/20/2017 CLINICAL DATA:  The patient suffered a fall when she tripped over the sleeve of a jacket today. Right elbow injury and pain. Initial encounter. EXAM: RIGHT ELBOW - COMPLETE 3+ VIEW COMPARISON:  None. FINDINGS: There is no evidence of fracture, dislocation, or joint effusion. Mild degenerative change about the elbow is noted. Soft tissues are unremarkable. IMPRESSION: No acute abnormality. Electronically Signed   By: Inge Rise M.D.   On: 04/20/2017 19:20   Dg Forearm Right  Result Date: 04/20/2017 CLINICAL DATA:  Right forearm injury due to a fall when the patient tripped over the sleeve of a jacket today. Initial encounter. EXAM: RIGHT FOREARM - 2 VIEW COMPARISON:  None. FINDINGS: Distal radius fracture is identified as seen on dedicated plain films of the wrist. No other acute bony or joint abnormality is present. There is some soft tissue swelling about the wrist. IMPRESSION: Acute distal radius fracture as seen on dedicated plain films of the wrist. Otherwise negative. Electronically Signed   By: Inge Rise M.D.   On: 04/20/2017 19:19   Dg Wrist Complete Right  Result Date: 04/20/2017 CLINICAL DATA:  The patient tripped over the sleeve of a jacket and fell today resulting in a right wrist injury. Pain. Initial encounter. EXAM: RIGHT WRIST - COMPLETE 3+ VIEW COMPARISON:  None. FINDINGS: The patient has an acute fracture of the distal radius. The fracture is slightly impacted and extends to the articular surface with minimal distraction at the articular surface identified. No other fracture is seen. 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 IMPRESSION: Minimally impacted intra-articular fracture of the distal radius as described. Electronically Signed   By: Inge Rise M.D.   On: 04/20/2017 19:18    Procedures .Splint Application Date/Time: 08/19/624 12:30 AM Performed by: Nicky Pugh Authorized by: Albesa Seen, PA-C    Consent:    Consent obtained:  Verbal   Consent given by:  Patient   Risks discussed:  Discoloration, numbness, pain and swelling Pre-procedure details:    Sensation:  Normal Procedure details:    Laterality:  Right   Location:  Wrist   Wrist:  R wrist   Splint type:  Sugar tong   Supplies:  Cotton padding, Ortho-Glass and sling Post-procedure details:    Pain:  Improved   Sensation:  Normal   Skin color:  2+ capillary refill   Patient  tolerance of procedure:  Tolerated well, no immediate complications   (including critical care time)  Medications Ordered in ED Medications  HYDROcodone-acetaminophen (NORCO/VICODIN) 5-325 MG per tablet 2 tablet (2 tablets Oral Given 04/20/17 2048)     Initial Impression / Assessment and Plan / ED Course  I have reviewed the triage vital signs and the nursing notes.  Pertinent labs & imaging results that were available during my care of the patient were reviewed by me and considered in my medical decision making (see chart for details).     72 year old female with fall on outstretched hand and subsequent distal radius fracture.  Volar tilt remains intact.  No other bony abnormalities on x-ray of right forearm or elbow.  Patient placed in sugar tong splint and will follow-up with Dr. Amedeo Plenty of hand surgery on Monday.  Patient neurovascularly intact in the right upper extremity.  Patient is neurologically intact, does not take blood thinning medications, and is complaining of no head pain at this time.  Therefore do not feel the patient requires Noncon head CT.  This was discussed with Dr. Quintella Reichert.  Patient was given return precautions for any increasing pain, pallor, paresthesias, pulselessness of the right upper extremity. Patient given splint care instructions.  I have reviewed the patient's information in the [facility] for the past 12 months and found them to have no Rx on record.  Opiates were  prescribed for an acute, painful condition. The patient was given information on side effects and encouraged to use other, non-opiate pain medication primary, only using opiate medicine sparingly for severe pain.   This is a shared visit with Dr. Quintella Reichert. Patient was independently evaluated by this attending physician. Attending physician consulted in evaluation and discharge management.   Final Clinical Impressions(s) / ED Diagnoses   Final diagnoses:  Other closed intra-articular fracture of distal end of right radius, initial encounter    ED Discharge Orders        Ordered    HYDROcodone-acetaminophen (NORCO/VICODIN) 5-325 MG tablet  Every 6 hours PRN     04/20/17 2152       Albesa Seen, PA-C 04/21/17 0031    Quintella Reichert, MD 04/21/17 [phone]

## 2017-04-20 NOTE — ED Triage Notes (Signed)
Patient reports fall earlier today after tripping on sleeve of jacket. Pain to right wrist. Patient had wrist splint placed PTA from previous carpel tunnel syndrome. Tenderness to right elbow and shoulder. Good ROM to shoulder. Good sensation to all fingers.

## 2017-04-25 DIAGNOSIS — S5291XA Unspecified fracture of right forearm, initial encounter for closed fracture: Secondary | ICD-10-CM | POA: Diagnosis not present

## 2017-04-25 DIAGNOSIS — M25531 Pain in right wrist: Secondary | ICD-10-CM | POA: Diagnosis not present

## 2017-05-02 DIAGNOSIS — S5291XD Unspecified fracture of right forearm, subsequent encounter for closed fracture with routine healing: Secondary | ICD-10-CM | POA: Diagnosis not present

## 2017-05-02 DIAGNOSIS — S5291XA Unspecified fracture of right forearm, initial encounter for closed fracture: Secondary | ICD-10-CM | POA: Diagnosis not present

## 2017-05-02 DIAGNOSIS — M25531 Pain in right wrist: Secondary | ICD-10-CM | POA: Diagnosis not present

## 2017-05-06 NOTE — Telephone Encounter (Signed)
Reached out to the patient and patient states "no never, she  does not want to try another CPAP.

## 2017-05-08 ENCOUNTER — Telehealth: Payer: Self-pay

## 2017-05-08 NOTE — Telephone Encounter (Signed)
-----   Message from Freada Bergeron, [place] sent at 05/06/2017  3:28 PM EDT ----- Regarding: Yearly appt. Patient says no one called her to schedule her yearly cardiology appointment. Please call. Thanks

## 2017-05-08 NOTE — Telephone Encounter (Signed)
I spoke with patient. Patient is scheduled with Dr. Radford Pax on 05/22/17. Patient in agreement with schedule and verbalized understanding.

## 2017-05-09 DIAGNOSIS — M25531 Pain in right wrist: Secondary | ICD-10-CM | POA: Diagnosis not present

## 2017-05-09 DIAGNOSIS — S5291XA Unspecified fracture of right forearm, initial encounter for closed fracture: Secondary | ICD-10-CM | POA: Diagnosis not present

## 2017-05-09 DIAGNOSIS — S5291XD Unspecified fracture of right forearm, subsequent encounter for closed fracture with routine healing: Secondary | ICD-10-CM | POA: Diagnosis not present

## 2017-05-22 ENCOUNTER — Encounter: Payer: Self-pay | Admitting: Cardiology

## 2017-05-22 ENCOUNTER — Ambulatory Visit (INDEPENDENT_AMBULATORY_CARE_PROVIDER_SITE_OTHER): Payer: Medicare Other | Admitting: Cardiology

## 2017-05-22 VITALS — BP 124/90 | HR 73 | Ht 66.5 in | Wt 309.0 lb

## 2017-05-22 DIAGNOSIS — I10 Essential (primary) hypertension: Secondary | ICD-10-CM

## 2017-05-22 DIAGNOSIS — G4733 Obstructive sleep apnea (adult) (pediatric): Secondary | ICD-10-CM | POA: Diagnosis not present

## 2017-05-22 NOTE — Patient Instructions (Signed)
Medication Instructions:  Your physician recommends that you continue on your current medications as directed. Please refer to the Current Medication list given to you today.  If you need a refill on your cardiac medications, please contact your pharmacy first.  Labwork: None ordered   Testing/Procedures: None ordered   Follow-Up: Your physician wants you to follow-up as needed with Dr. Turner.   Any Other Special Instructions Will Be Listed Below (If Applicable).   Thank you for choosing [facility]    Rena Itamar Mcgowan, RN  [phone]  If you need a refill on your cardiac medications before your next appointment, please call your pharmacy.   

## 2017-05-22 NOTE — Progress Notes (Addendum)
Cardiology Office Note:    Date:  05/22/2017   ID:  Cassandra Ho, DOB 02/27/1945, MRN [id number]  PCP:  Sueanne Margarita, MD  Cardiologist:  No primary care provider on file.    Referring MD: Sueanne Margarita, MD   Chief Complaint  Patient presents with  . Sleep Apnea  . Hypertension  . Hyperlipidemia    History of Present Illness:    Cassandra Ho is a 72 y.o. female with a hx of OSA on CPAP, HTN, hyperlipidemia and morbid obesity.  She has been intolerant to CPAP therapy in the past and is followed by Dr. Toy Cookey but it started moving her teeth and she stopped using it.  He says she is actually sleeping better without the oral device and is not waking up gasping for breath or snoring.  Her husband says she does not snore anymore.  She feels rested in the morning with no daytime sleepiness.  She had been using a stationary bike for exercise but then broke her right wrist and is in a brace and has not been able to exercise because she cannot study herself on her bike.  She hopefully will get back to exercising soon.  Past Medical History:  Diagnosis Date  . Allergic rhinitis   . Arthritis    generalized-ankles and knees  . Dyslipidemia   . GERD (gastroesophageal reflux disease)   . H/O: pituitary tumor    "irradicated" no problems now  . Headache    migraines in past  . Hypertension   . Hypothyroidism   . Hypothyroidism (acquired)    Post-ablation  . Morbid obesity ([place])   . OSA (obstructive sleep apnea)    intolerant to CPAP and now using an oral device  . 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 with 31.4 mCi of I-131 on 12/05/2007  . Transfusion history    with hip surgery    Past Surgical History:  Procedure Laterality Date  . ANKLE ARTHROTOMY Left    ankle and foot reconstruction ;retained hardware  . BREAST BIOPSY Bilateral    Many years ago, no scar seen   . BREAST SURGERY     x2 biopsies(benign)  . CARPAL TUNNEL RELEASE  Bilateral   . COLONOSCOPY WITH PROPOFOL N/A 06/15/2014   Procedure: COLONOSCOPY WITH PROPOFOL;  Surgeon: Garlan Fair, MD;  Location: [facility];  Service: Endoscopy;  Laterality: N/A;  . HIP FRACTURE SURGERY     repaired with cader bone and metal plates  . KNEE ARTHROSCOPY Right     Current Medications: Current Meds  Medication Sig  . aspirin 81 MG tablet Take 81 mg by mouth daily.  . Calcium Carbonate-Vit D-Min (CALCIUM 1200 PO) Take 1 tablet by mouth daily.  . diphenhydramine-acetaminophen (TYLENOL PM) 25-500 MG TABS Take 1 tablet by mouth at bedtime as needed.  . fenofibrate 160 MG tablet Take 1 tablet (160 mg total) by mouth daily.  Marland Kitchen levothyroxine (SYNTHROID, LEVOTHROID) 150 MCG tablet Take 150 mcg by mouth daily before breakfast.  . losartan-hydrochlorothiazide (HYZAAR) 50-12.5 MG tablet Take 1 tablet by mouth daily.  Earney Navy Bicarbonate (ZEGERID) 20-1100 MG CAPS capsule Take 1 capsule by mouth as needed (for acid reflux).   Marland Kitchen OVER THE COUNTER MEDICATION Ultimate OMEGA 3 tablets once a day  . rosuvastatin (CRESTOR) 20 MG tablet Take 1 tablet (20 mg total) by mouth daily.     Allergies:   Patient has no known allergies.   Social History  Socioeconomic History  . Marital status: Married    Spouse name: Not on file  . Number of children: Not on file  . Years of education: Not on file  . Highest education level: Not on file  Occupational History  . Not on file  Social Needs  . Financial resource strain: Not on file  . Food insecurity:    Worry: Not on file    Inability: Not on file  . Transportation needs:    Medical: Not on file    Non-medical: Not on file  Tobacco Use  . Smoking status: Former Smoker    Years: 3.00  . Smokeless tobacco: Never Used  . Tobacco comment: Quit '78  Substance and Sexual Activity  . Alcohol use: Yes    Comment: ocass  . Drug use: No  . Sexual activity: Not on file  Lifestyle  . Physical activity:    Days per week:  Not on file    Minutes per session: Not on file  . Stress: Not on file  Relationships  . Social connections:    Talks on phone: Not on file    Gets together: Not on file    Attends religious service: Not on file    Active member of club or organization: Not on file    Attends meetings of clubs or organizations: Not on file    Relationship status: Not on file  Other Topics Concern  . Not on file  Social History Narrative   ** Merged History Encounter **         Family History: The patient's family history includes Alcoholism in her father; Breast cancer in her maternal aunt and sister; Cirrhosis in her father; Fibromyalgia in her sister; Lung cancer in her mother; Melanoma in her mother; Thyroid cancer in her mother.  ROS:   Please see the history of present illness.    ROS  All other systems reviewed and negative.   EKGs/Labs/Other Studies Reviewed:    The following studies were reviewed today: none  EKG:  EKG is not ordered today and showed sinus rhythm at 72 bpm with no ST changes.  Recent Labs: 06/11/2016: ALT 15; BUN 19; Creatinine, Ser 0.55; Potassium 4.6; Sodium 143   Recent Lipid Panel    Component Value Date/Time   CHOL 116 06/11/2016 0942   CHOL 127 05/11/2013 0819   TRIG 149 06/11/2016 0942   TRIG 144 05/11/2013 0819   HDL 42 06/11/2016 0942   HDL 49 05/11/2013 0819   CHOLHDL 2.8 04/22/2015 0847   VLDL 31 (H) 04/22/2015 0847   LDLCALC 45 04/22/2015 0847   LDLCALC 49 05/11/2013 0819    Physical Exam:    VS:  BP 124/90   Pulse 73   Ht 5' 6.5" (1.689 m)   Wt (!) 309 lb (140.2 kg)   BMI 49.13 kg/m     Wt Readings from Last 3 Encounters:  05/22/17 (!) 309 lb (140.2 kg)  06/11/16 (!) 313 lb (142 kg)  04/22/15 (!) 319 lb (144.7 kg)     GEN:  Well nourished, well developed in no acute distress HEENT: Normal NECK: No JVD; No carotid bruits LYMPHATICS: No lymphadenopathy CARDIAC: RRR, no murmurs, rubs, gallops RESPIRATORY:  Clear to auscultation  without rales, wheezing or rhonchi  ABDOMEN: Soft, non-tender, non-distended MUSCULOSKELETAL:  No edema; No deformity  SKIN: Warm and dry NEUROLOGIC:  Alert and oriented x 3 PSYCHIATRIC:  Normal affect   ASSESSMENT:    1. OSA (obstructive sleep  apnea)   2. Essential hypertension, benign   3. Class 2 severe obesity due to excess calories with serious comorbidity in adult, unspecified BMI ([place])    PLAN:    In order of problems listed above:  1.  Obstructive sleep apnea -she is no longer using her oral device because it was moving her teeth.  She is intolerant to CPAP therapy.  Her weight precludes the inspire study.  Says she sleeps very well now and only wakes up once a night to use the restroom and feels rested in the morning with no daytime sleepiness.  2.  Hypertension - blood pressure well controlled on exam today.  She will continue on Hyzaar 50-12.5 mg daily.  3.  Obesity - I have encouraged her to try to get some type of formal exercise.  She does have a stationary bike at home.  She is intolerant to CPAP and the oral device and is doing well sleeping with no daytime sleepiness she is going to continue with positional sleeping trying to avoid sleeping on her back and trying to get back to an exercise program.  She is going to follow-up with me on a as needed basis and will follow up with her primary care physician for treatment of her hypertension hyperlipidemia.  Medication Adjustments/Labs and Tests Ordered: Current medicines are reviewed at length with the patient today.  Concerns regarding medicines are outlined above.  Orders Placed This Encounter  Procedures  . EKG 12-Lead   No orders of the defined types were placed in this encounter.   Signed, Fransico Him, MD  05/22/2017 8:51 AM    [place]

## 2017-05-27 ENCOUNTER — Other Ambulatory Visit: Payer: Self-pay | Admitting: Cardiology

## 2017-05-27 ENCOUNTER — Encounter: Payer: Self-pay | Admitting: Cardiology

## 2017-05-27 MED ORDER — LOSARTAN POTASSIUM-HCTZ 50-12.5 MG PO TABS: 1.0000 | ORAL_TABLET | Freq: Every day | ORAL | 3 refills | Status: DC

## 2017-05-27 MED ORDER — FENOFIBRATE 160 MG PO TABS: 160.0000 mg | ORAL_TABLET | Freq: Every day | ORAL | 3 refills | Status: DC

## 2017-05-27 MED ORDER — ROSUVASTATIN CALCIUM 20 MG PO TABS: 20.0000 mg | ORAL_TABLET | Freq: Every day | ORAL | 3 refills | Status: DC

## 2017-05-27 NOTE — Telephone Encounter (Signed)
Pt's medication was sent to pt's pharmacy as requested. Confirmation received.  °

## 2017-05-29 DIAGNOSIS — M25531 Pain in right wrist: Secondary | ICD-10-CM | POA: Diagnosis not present

## 2017-05-29 DIAGNOSIS — S5291XD Unspecified fracture of right forearm, subsequent encounter for closed fracture with routine healing: Secondary | ICD-10-CM | POA: Diagnosis not present

## 2017-06-12 DIAGNOSIS — S5291XA Unspecified fracture of right forearm, initial encounter for closed fracture: Secondary | ICD-10-CM | POA: Diagnosis not present

## 2017-07-10 DIAGNOSIS — S5291XA Unspecified fracture of right forearm, initial encounter for closed fracture: Secondary | ICD-10-CM | POA: Diagnosis not present

## 2017-07-10 DIAGNOSIS — S5291XD Unspecified fracture of right forearm, subsequent encounter for closed fracture with routine healing: Secondary | ICD-10-CM | POA: Diagnosis not present

## 2017-07-10 DIAGNOSIS — M25531 Pain in right wrist: Secondary | ICD-10-CM | POA: Diagnosis not present

## 2017-10-31 MED ORDER — LOSARTAN POTASSIUM-HCTZ 50-12.5 MG PO TABS: 1.0000 | ORAL_TABLET | Freq: Every day | ORAL | 3 refills | Status: DC

## 2017-10-31 NOTE — Telephone Encounter (Signed)
Spoke with the patient, [facility] is backordered for over 6 weeks. The patient requested it be sent to a local pharmacy. The medication was reordered.

## 2017-11-11 DIAGNOSIS — Z23 Encounter for immunization: Secondary | ICD-10-CM | POA: Diagnosis not present

## 2018-01-06 DIAGNOSIS — M19012 Primary osteoarthritis, left shoulder: Secondary | ICD-10-CM | POA: Diagnosis not present

## 2018-01-06 DIAGNOSIS — M25512 Pain in left shoulder: Secondary | ICD-10-CM | POA: Diagnosis not present

## 2018-05-27 ENCOUNTER — Other Ambulatory Visit: Payer: Self-pay | Admitting: Cardiology

## 2018-06-11 ENCOUNTER — Other Ambulatory Visit: Payer: Self-pay | Admitting: Cardiology

## 2018-06-11 DIAGNOSIS — D1801 Hemangioma of skin and subcutaneous tissue: Secondary | ICD-10-CM | POA: Diagnosis not present

## 2018-06-11 DIAGNOSIS — L918 Other hypertrophic disorders of the skin: Secondary | ICD-10-CM | POA: Diagnosis not present

## 2018-06-11 DIAGNOSIS — D2261 Melanocytic nevi of right upper limb, including shoulder: Secondary | ICD-10-CM | POA: Diagnosis not present

## 2018-06-11 DIAGNOSIS — L738 Other specified follicular disorders: Secondary | ICD-10-CM | POA: Diagnosis not present

## 2018-06-11 DIAGNOSIS — L821 Other seborrheic keratosis: Secondary | ICD-10-CM | POA: Diagnosis not present

## 2018-06-11 DIAGNOSIS — L814 Other melanin hyperpigmentation: Secondary | ICD-10-CM | POA: Diagnosis not present

## 2018-06-11 DIAGNOSIS — L820 Inflamed seborrheic keratosis: Secondary | ICD-10-CM | POA: Diagnosis not present

## 2018-06-11 DIAGNOSIS — D485 Neoplasm of uncertain behavior of skin: Secondary | ICD-10-CM | POA: Diagnosis not present

## 2018-07-15 DIAGNOSIS — N898 Other specified noninflammatory disorders of vagina: Secondary | ICD-10-CM | POA: Diagnosis not present

## 2018-07-15 DIAGNOSIS — Z4689 Encounter for fitting and adjustment of other specified devices: Secondary | ICD-10-CM | POA: Diagnosis not present

## 2018-07-15 DIAGNOSIS — N950 Postmenopausal bleeding: Secondary | ICD-10-CM | POA: Diagnosis not present

## 2018-07-22 DIAGNOSIS — N72 Inflammatory disease of cervix uteri: Secondary | ICD-10-CM | POA: Diagnosis not present

## 2018-07-22 DIAGNOSIS — N950 Postmenopausal bleeding: Secondary | ICD-10-CM | POA: Diagnosis not present

## 2018-07-22 DIAGNOSIS — N8189 Other female genital prolapse: Secondary | ICD-10-CM | POA: Diagnosis not present

## 2018-08-14 DIAGNOSIS — N8189 Other female genital prolapse: Secondary | ICD-10-CM | POA: Diagnosis not present

## 2018-08-14 DIAGNOSIS — N950 Postmenopausal bleeding: Secondary | ICD-10-CM | POA: Diagnosis not present

## 2018-09-23 ENCOUNTER — Other Ambulatory Visit: Payer: Self-pay | Admitting: Nurse Practitioner

## 2018-09-23 DIAGNOSIS — N72 Inflammatory disease of cervix uteri: Secondary | ICD-10-CM | POA: Diagnosis not present

## 2018-09-23 DIAGNOSIS — N888 Other specified noninflammatory disorders of cervix uteri: Secondary | ICD-10-CM | POA: Diagnosis not present

## 2018-11-10 ENCOUNTER — Other Ambulatory Visit: Payer: Self-pay | Admitting: Cardiology

## 2018-11-17 DIAGNOSIS — Z23 Encounter for immunization: Secondary | ICD-10-CM | POA: Diagnosis not present

## 2018-11-25 ENCOUNTER — Other Ambulatory Visit: Payer: Self-pay | Admitting: Cardiology

## 2019-01-09 ENCOUNTER — Other Ambulatory Visit: Payer: Self-pay | Admitting: Cardiology

## 2019-01-19 IMAGING — CR DG ELBOW COMPLETE 3+V*R*
5 series · 5 of 5 positions shown · non-contrast
Comparison: None.

CLINICAL DATA: The patient suffered a fall when she tripped over
the sleeve of a jacket today. Right elbow injury and pain. Initial
encounter.

EXAM:
RIGHT ELBOW - COMPLETE 3+ VIEW

[x elbow ap right]
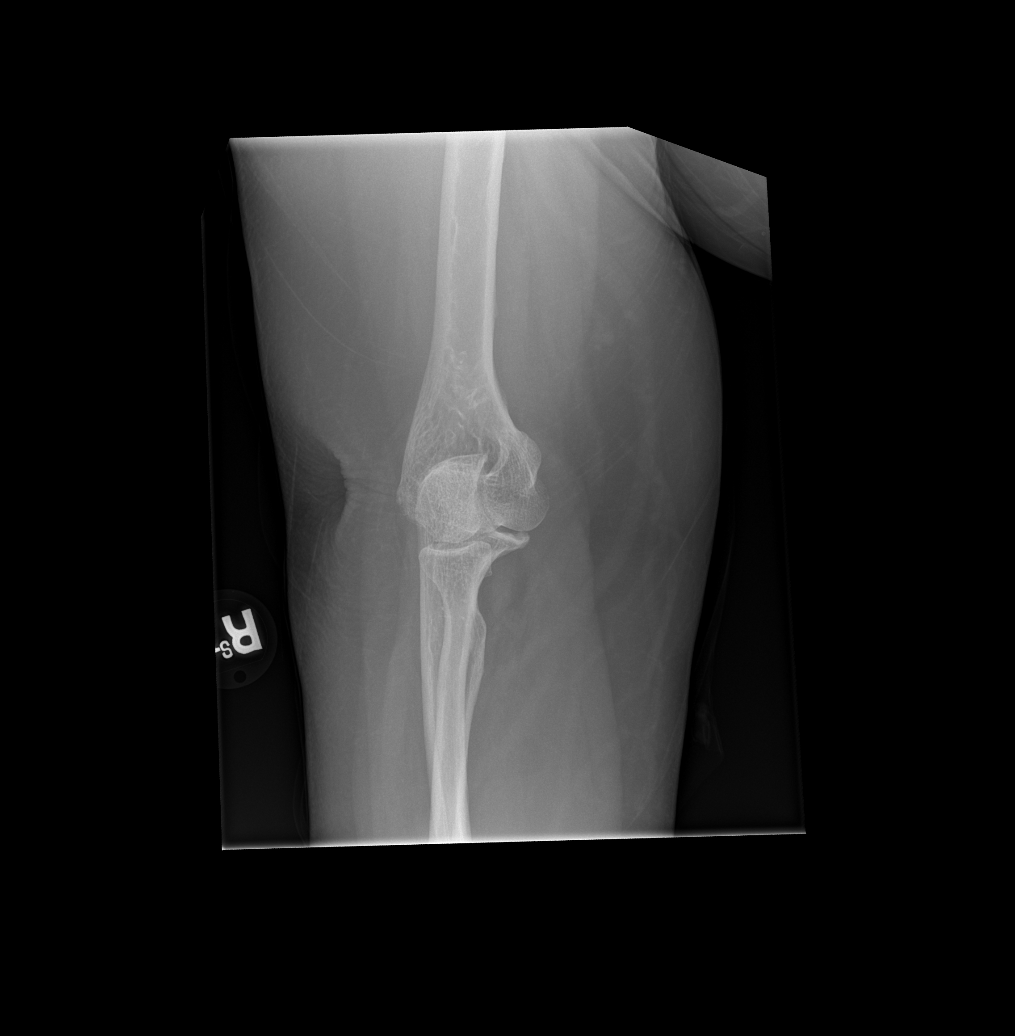

[x elbow obl right (1 of 3)]
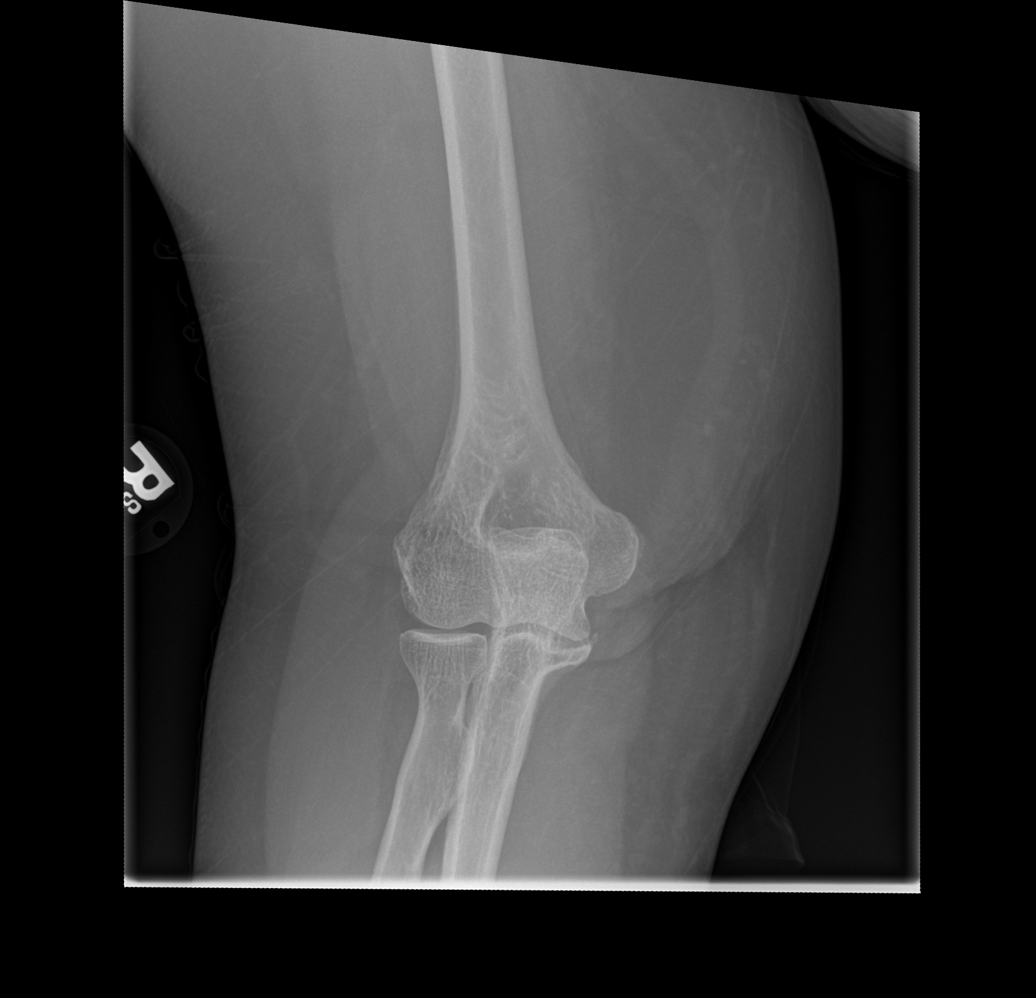

[x elbow obl right (2 of 3)]
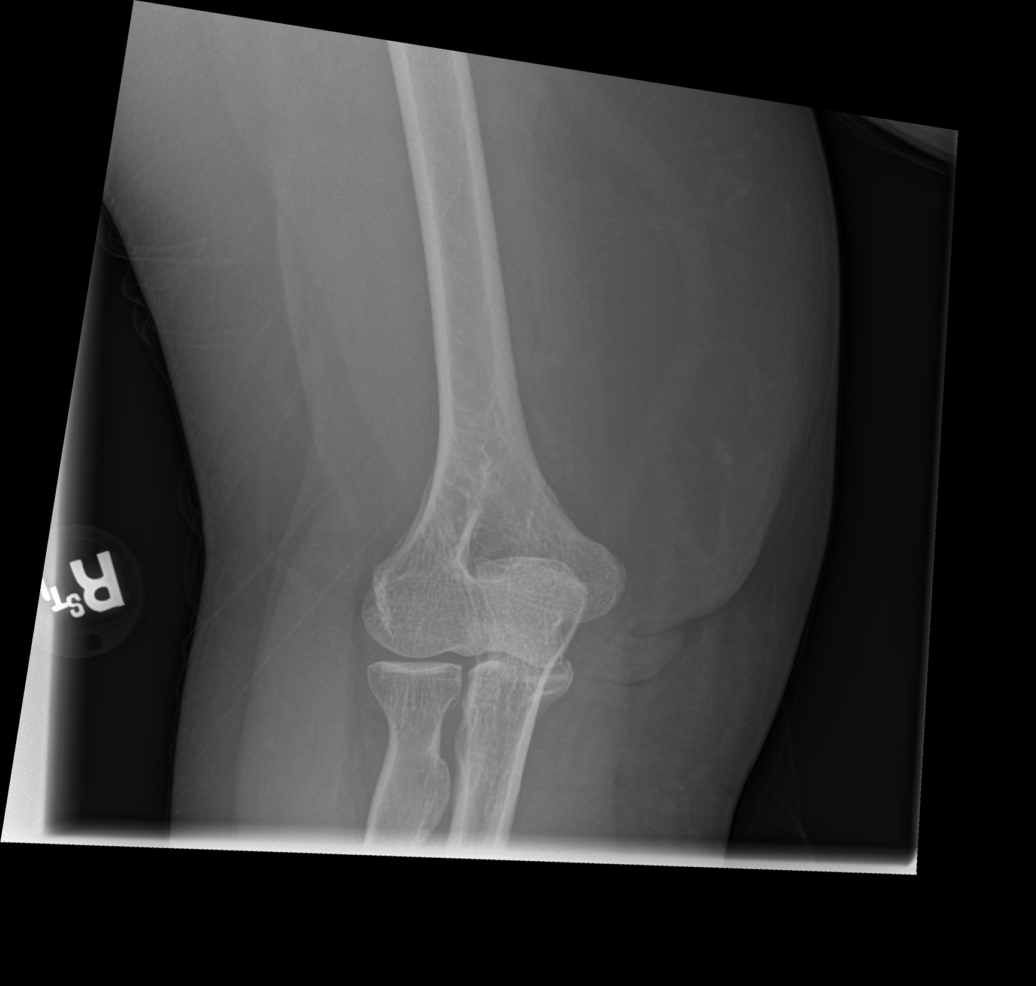

[x elbow obl right (3 of 3)]
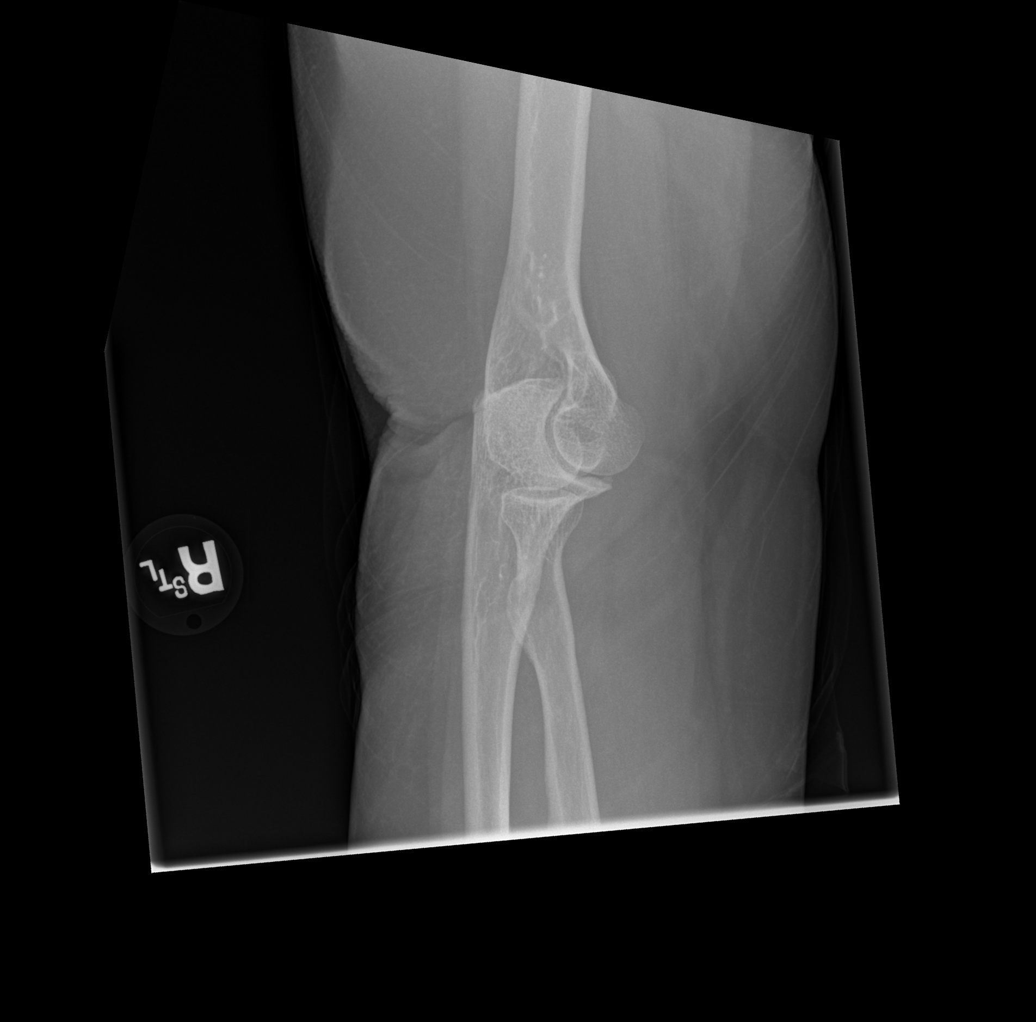

[x elbow lat right]
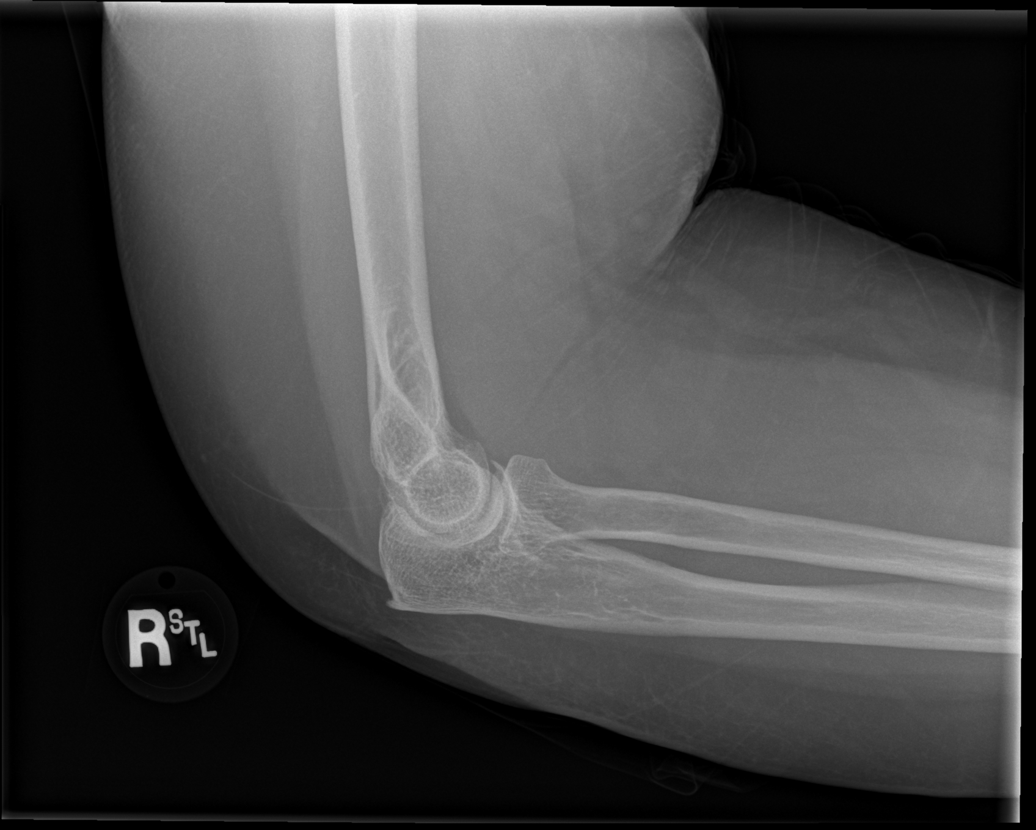

[5 of 5 positions shown; findings below may reference images not displayed]

FINDINGS: There is no evidence of fracture, dislocation, or joint effusion.
Mild degenerative change about the elbow is noted. Soft tissues are
unremarkable.
IMPRESSION: No acute abnormality.

## 2019-02-05 ENCOUNTER — Other Ambulatory Visit: Payer: Self-pay | Admitting: Cardiology

## 2019-02-25 ENCOUNTER — Other Ambulatory Visit: Payer: Self-pay | Admitting: Cardiology

## 2019-03-12 ENCOUNTER — Ambulatory Visit: Payer: Medicare Other | Attending: Internal Medicine

## 2019-03-12 DIAGNOSIS — Z23 Encounter for immunization: Secondary | ICD-10-CM

## 2019-03-12 NOTE — Progress Notes (Signed)
   Covid-19 Vaccination Clinic  Name:  TARA DIVINCENZO    MRN: [id number] DOB: 04-03-1945  03/12/2019  Ms. Roser was observed post Covid-19 immunization for 15 minutes without incidence. She was provided with Vaccine Information Sheet and instruction to access the V-Safe system.   Ms. Ames was instructed to call 911 with any severe reactions post vaccine: Marland Kitchen Difficulty breathing  . Swelling of your face and throat  . A fast heartbeat  . A bad rash all over your body  . Dizziness and weakness    Immunizations Administered    Name Date Dose VIS Date Route   Pfizer COVID-19 Vaccine 03/12/2019  6:10 PM 0.3 mL 01/30/2019 Intramuscular   Manufacturer: [place]   Lot: [id number]   [place]: [id number]

## 2019-04-02 ENCOUNTER — Ambulatory Visit: Payer: Medicare Other | Attending: Internal Medicine

## 2019-04-02 DIAGNOSIS — Z23 Encounter for immunization: Secondary | ICD-10-CM

## 2019-04-02 NOTE — Progress Notes (Signed)
   Covid-19 Vaccination Clinic  Name:  Cassandra Ho    MRN: [id number] DOB: [birth year]  04/02/2019  [name] was observed post Covid-19 immunization for 15 minutes without incidence. She was provided with Vaccine Information Sheet and instruction to access the V-Safe system.   [name] was instructed to call 911 with any severe reactions post vaccine: Marland Kitchen Difficulty breathing  . Swelling of your face and throat  . A fast heartbeat  . A bad rash all over your body  . Dizziness and weakness    Immunizations Administered    Name Date Dose VIS Date Route   Pfizer COVID-19 Vaccine 04/02/2019  1:50 PM 0.3 mL 01/30/2019 Intramuscular   Manufacturer: [place]   Lot: [id number]   [place]: [id number]

## 2019-04-12 ENCOUNTER — Other Ambulatory Visit: Payer: Self-pay | Admitting: Cardiology

## 2019-04-28 ENCOUNTER — Other Ambulatory Visit: Payer: Self-pay | Admitting: Cardiology

## 2019-04-30 ENCOUNTER — Other Ambulatory Visit: Payer: Self-pay | Admitting: Cardiology

## 2019-08-29 DIAGNOSIS — N390 Urinary tract infection, site not specified: Secondary | ICD-10-CM | POA: Diagnosis not present

## 2019-11-13 DIAGNOSIS — Z23 Encounter for immunization: Secondary | ICD-10-CM | POA: Diagnosis not present

## 2019-11-26 DIAGNOSIS — Z23 Encounter for immunization: Secondary | ICD-10-CM | POA: Diagnosis not present

## 2020-01-22 DIAGNOSIS — Z960 Presence of urogenital implants: Secondary | ICD-10-CM | POA: Diagnosis not present

## 2020-01-22 DIAGNOSIS — N939 Abnormal uterine and vaginal bleeding, unspecified: Secondary | ICD-10-CM | POA: Diagnosis not present

## 2020-02-08 DIAGNOSIS — N939 Abnormal uterine and vaginal bleeding, unspecified: Secondary | ICD-10-CM | POA: Diagnosis not present

## 2020-02-08 DIAGNOSIS — Z4689 Encounter for fitting and adjustment of other specified devices: Secondary | ICD-10-CM | POA: Diagnosis not present

## 2020-03-02 DIAGNOSIS — Z4689 Encounter for fitting and adjustment of other specified devices: Secondary | ICD-10-CM | POA: Diagnosis not present

## 2020-03-22 DIAGNOSIS — M069 Rheumatoid arthritis, unspecified: Secondary | ICD-10-CM | POA: Diagnosis not present

## 2020-03-22 DIAGNOSIS — E890 Postprocedural hypothyroidism: Secondary | ICD-10-CM | POA: Diagnosis not present

## 2020-03-22 DIAGNOSIS — G4733 Obstructive sleep apnea (adult) (pediatric): Secondary | ICD-10-CM | POA: Diagnosis not present

## 2020-03-22 DIAGNOSIS — I10 Essential (primary) hypertension: Secondary | ICD-10-CM | POA: Diagnosis not present

## 2020-03-22 DIAGNOSIS — M170 Bilateral primary osteoarthritis of knee: Secondary | ICD-10-CM | POA: Diagnosis not present

## 2020-03-22 DIAGNOSIS — E7800 Pure hypercholesterolemia, unspecified: Secondary | ICD-10-CM | POA: Diagnosis not present

## 2020-04-05 DIAGNOSIS — M170 Bilateral primary osteoarthritis of knee: Secondary | ICD-10-CM | POA: Diagnosis not present

## 2020-04-05 DIAGNOSIS — E890 Postprocedural hypothyroidism: Secondary | ICD-10-CM | POA: Diagnosis not present

## 2020-04-05 DIAGNOSIS — E7800 Pure hypercholesterolemia, unspecified: Secondary | ICD-10-CM | POA: Diagnosis not present

## 2020-04-05 DIAGNOSIS — G4733 Obstructive sleep apnea (adult) (pediatric): Secondary | ICD-10-CM | POA: Diagnosis not present

## 2020-04-05 DIAGNOSIS — I10 Essential (primary) hypertension: Secondary | ICD-10-CM | POA: Diagnosis not present

## 2020-04-05 DIAGNOSIS — Z0000 Encounter for general adult medical examination without abnormal findings: Secondary | ICD-10-CM | POA: Diagnosis not present

## 2020-04-05 DIAGNOSIS — Z4689 Encounter for fitting and adjustment of other specified devices: Secondary | ICD-10-CM | POA: Diagnosis not present

## 2020-04-15 DIAGNOSIS — Z20822 Contact with and (suspected) exposure to covid-19: Secondary | ICD-10-CM | POA: Diagnosis not present

## 2020-04-15 DIAGNOSIS — Z03818 Encounter for observation for suspected exposure to other biological agents ruled out: Secondary | ICD-10-CM | POA: Diagnosis not present

## 2020-04-29 DIAGNOSIS — Z4689 Encounter for fitting and adjustment of other specified devices: Secondary | ICD-10-CM | POA: Diagnosis not present

## 2020-06-14 DIAGNOSIS — Z4689 Encounter for fitting and adjustment of other specified devices: Secondary | ICD-10-CM | POA: Diagnosis not present

## 2020-09-05 DIAGNOSIS — Z4689 Encounter for fitting and adjustment of other specified devices: Secondary | ICD-10-CM | POA: Diagnosis not present

## 2020-09-15 ENCOUNTER — Other Ambulatory Visit: Payer: Self-pay | Admitting: Obstetrics and Gynecology

## 2020-09-15 DIAGNOSIS — Z1231 Encounter for screening mammogram for malignant neoplasm of breast: Secondary | ICD-10-CM

## 2020-09-17 ENCOUNTER — Ambulatory Visit
Admission: RE | Admit: 2020-09-17 | Discharge: 2020-09-17 | Disposition: A | Discharge status: Home / Self Care | Payer: Medicare Other | Source: Ambulatory Visit | Attending: Obstetrics and Gynecology | Admitting: Obstetrics and Gynecology

## 2020-09-17 ENCOUNTER — Other Ambulatory Visit: Payer: Self-pay

## 2020-09-17 DIAGNOSIS — Z1231 Encounter for screening mammogram for malignant neoplasm of breast: Secondary | ICD-10-CM | POA: Diagnosis not present

## 2020-11-28 DIAGNOSIS — Z4689 Encounter for fitting and adjustment of other specified devices: Secondary | ICD-10-CM | POA: Diagnosis not present

## 2020-12-26 DIAGNOSIS — Z4689 Encounter for fitting and adjustment of other specified devices: Secondary | ICD-10-CM | POA: Diagnosis not present

## 2021-01-13 ENCOUNTER — Emergency Department (HOSPITAL_COMMUNITY): Payer: Medicare Other

## 2021-01-13 ENCOUNTER — Encounter (HOSPITAL_COMMUNITY): Payer: Self-pay

## 2021-01-13 ENCOUNTER — Other Ambulatory Visit: Payer: Self-pay

## 2021-01-13 ENCOUNTER — Emergency Department (HOSPITAL_COMMUNITY)
Admission: EM | Admit: 2021-01-13 | Discharge: 2021-01-13 | Disposition: A | Discharge status: Home / Self Care | Payer: Medicare Other | Attending: Emergency Medicine | Admitting: Emergency Medicine

## 2021-01-13 DIAGNOSIS — E039 Hypothyroidism, unspecified: Secondary | ICD-10-CM | POA: Diagnosis not present

## 2021-01-13 DIAGNOSIS — I4891 Unspecified atrial fibrillation: Secondary | ICD-10-CM | POA: Diagnosis not present

## 2021-01-13 DIAGNOSIS — R6884 Jaw pain: Secondary | ICD-10-CM | POA: Diagnosis not present

## 2021-01-13 DIAGNOSIS — I10 Essential (primary) hypertension: Secondary | ICD-10-CM | POA: Diagnosis not present

## 2021-01-13 DIAGNOSIS — R079 Chest pain, unspecified: Secondary | ICD-10-CM | POA: Diagnosis not present

## 2021-01-13 DIAGNOSIS — Z79899 Other long term (current) drug therapy: Secondary | ICD-10-CM | POA: Diagnosis not present

## 2021-01-13 DIAGNOSIS — Z87891 Personal history of nicotine dependence: Secondary | ICD-10-CM | POA: Diagnosis not present

## 2021-01-13 DIAGNOSIS — Z7982 Long term (current) use of aspirin: Secondary | ICD-10-CM | POA: Insufficient documentation

## 2021-01-13 DIAGNOSIS — Z743 Need for continuous supervision: Secondary | ICD-10-CM | POA: Diagnosis not present

## 2021-01-13 DIAGNOSIS — R002 Palpitations: Secondary | ICD-10-CM | POA: Diagnosis not present

## 2021-01-13 DIAGNOSIS — K047 Periapical abscess without sinus: Secondary | ICD-10-CM | POA: Insufficient documentation

## 2021-01-13 DIAGNOSIS — R519 Headache, unspecified: Secondary | ICD-10-CM | POA: Diagnosis not present

## 2021-01-13 DIAGNOSIS — R6889 Other general symptoms and signs: Secondary | ICD-10-CM | POA: Diagnosis not present

## 2021-01-13 DIAGNOSIS — I491 Atrial premature depolarization: Secondary | ICD-10-CM | POA: Diagnosis not present

## 2021-01-13 DIAGNOSIS — I499 Cardiac arrhythmia, unspecified: Secondary | ICD-10-CM | POA: Diagnosis not present

## 2021-01-13 LAB — COMPREHENSIVE METABOLIC PANEL
ALT: 14 U/L (ref 0–44)
AST: 19 U/L (ref 15–41)
Albumin: 3.9 g/dL (ref 3.5–5.0)
Alkaline Phosphatase: 68 U/L (ref 38–126)
Anion gap: 11 (ref 5–15)
BUN: 23 mg/dL (ref 8–23)
CO2: 24 mmol/L (ref 22–32)
Calcium: 9.5 mg/dL (ref 8.9–10.3)
Chloride: 103 mmol/L (ref 98–111)
Creatinine, Ser: 0.74 mg/dL (ref 0.44–1.00)
GFR, Estimated: >60 mL/min (ref >60)
Glucose, Bld: 130 mg/dL — ABNORMAL HIGH (ref 70–99)
Potassium: 4.2 mmol/L (ref 3.5–5.1)
Sodium: 138 mmol/L (ref 135–145)
Total Bilirubin: 0.6 mg/dL (ref 0.3–1.2)
Total Protein: 6.9 g/dL (ref 6.5–8.1)

## 2021-01-13 LAB — CBC WITH DIFFERENTIAL/PLATELET
Abs Immature Granulocytes: 0.02 10*3/uL (ref 0.00–0.07)
Basophils Absolute: 0.1 10*3/uL (ref 0.0–0.1)
Basophils Relative: 1 %
Eosinophils Absolute: 0 10*3/uL (ref 0.0–0.5)
Eosinophils Relative: 0 %
HCT: 45.9 % (ref 36.0–46.0)
Hemoglobin: 14.9 g/dL (ref 12.0–15.0)
Immature Granulocytes: 0 %
Lymphocytes Relative: 15 %
Lymphs Abs: 1.4 10*3/uL (ref 0.7–4.0)
MCH: 30.7 pg (ref 26.0–34.0)
MCHC: 32.5 g/dL (ref 30.0–36.0)
MCV: 94.6 fL (ref 80.0–100.0)
Monocytes Absolute: 0.6 10*3/uL (ref 0.1–1.0)
Monocytes Relative: 6 %
Neutro Abs: 7.4 10*3/uL (ref 1.7–7.7)
Neutrophils Relative %: 78 %
Platelets: 238 10*3/uL (ref 150–400)
RBC: 4.85 MIL/uL (ref 3.87–5.11)
RDW: 13.4 % (ref 11.5–15.5)
WBC: 9.4 10*3/uL (ref 4.0–10.5)
nRBC: 0 % (ref 0.0–0.2)

## 2021-01-13 LAB — TROPONIN I (HIGH SENSITIVITY): Troponin I (High Sensitivity): 8 ng/L (ref <18)

## 2021-01-13 MED ORDER — AMOXICILLIN-POT CLAVULANATE 875-125 MG PO TABS: 1.0000 | ORAL_TABLET | Freq: Two times a day (BID) | ORAL | 0 refills | Status: AC

## 2021-01-13 NOTE — ED Triage Notes (Signed)
Pt from PCP office with ems, c.o right jaw pain intermittent x1 week.NSR with PVCs on EKG.   BP: 160/58

## 2021-01-13 NOTE — ED Provider Notes (Signed)
Emergency Medicine Provider Triage Evaluation Note  Cassandra Ho , a 75 y.o. female  was evaluated in triage.  Pt complains of right-sided lower jaw pain x1 week.  Patient with history of tooth that is needing a new crown right in the area where she has pain.  She followed up with her primary care doctor today as her dentist has been closed for the holiday.  States that they had a hard time getting accurate blood pressure and oxygen saturation so they performed an EKG which they were concerned had some ST changes and called EMS and directed her to the emergency department she denies any chest pain or shortness of breath.  Review of Systems  Positive: Jaw pain, fractured tooth, 1 episode palpitations this week. Negative: Fevers, chills, chest pain or shortness of breath  Physical Exam  BP (!) 145/73 (BP Location: Right Arm)   Pulse 77   Temp 98.8 F (37.1 C) (Oral)   Resp 16   Ht 5\' 6"  (1.676 m)   Wt 133.8 kg   SpO2 96%   BMI 47.61 kg/m  Gen:   Awake, no distress   Resp:  Normal effort  MSK:   Moves extremities without difficulty  Other:  RRR no M/R/G.  Lungs CTA B.  Patient does have gingival and buccal mucosal edema adjacent to tooth in the right mandible, which does appear fractured  Medical Decision Making  Medically screening exam initiated at 12:20 PM.  Appropriate orders placed.  Weyman Rodney was informed that the remainder of the evaluation will be completed by another provider, this initial triage assessment does not replace that evaluation, and the importance of remaining in the ED until their evaluation is complete.  Suspect patient's jaw pain is related to brewing dental infection, however given concerns for findings on her EKG at her PCP will proceed with cardiac work-up as well.  This chart was dictated using voice recognition software, Dragon. Despite the best efforts of this provider to proofread and correct errors, errors may still occur which can change  documentation meaning.    Emeline Darling, PA-C 01/13/21 Bennett A, DO 01/14/21 1609

## 2021-01-13 NOTE — Discharge Instructions (Addendum)
Like we discussed, I am prescribing you an antibiotic called Augmentin.  Please take this twice a day for the next 10 days.  You can begin taking this tonight.  I would recommend taking it with a small amount of food to prevent stomach irritation.  Please do not stop taking this antibiotic early even if you feel your symptoms have resolved.  Please follow-up with your regular doctor as well as your dental provider.  If you develop any new or worsening symptoms please come back to the emergency department.

## 2021-01-13 NOTE — ED Notes (Signed)
Discharge instructions, prescription, and follow up care reviewed and explained, pt verbalized understanding.

## 2021-01-13 NOTE — ED Provider Notes (Signed)
[facility] Provider Note   CSN: [id number] Arrival date & time: 01/13/21  1202     History No chief complaint on file.   [facility] is a 75 y.o. female.  HPI Patient is a 75 year old female with a history of hyperlipidemia, GERD, hypertension, hypothyroidism, OSA, who presents to the emergency department due to jaw pain.  Patient states that she has been experiencing a right lower jaw pain and swelling for the past week.  She has a broken tooth in the region that she believes is the source of her pain.  She states that she initially went to urgent care today and due to tachycardia they obtained an ECG and noticed ST changes and called EMS and sent her to the emergency department.  Patient states that she did have an episode of palpitations about 1 week ago but otherwise denies any chest pain, shortness of breath, syncope, numbness, weakness, visual changes, fevers, chills.    Past Medical History:  Diagnosis Date   Allergic rhinitis    Arthritis    generalized-ankles and knees   Dyslipidemia    GERD (gastroesophageal reflux disease)    H/O: pituitary tumor    "irradicated" no problems now   Headache    migraines in past   Hypertension    Hypothyroidism    Hypothyroidism (acquired)    Post-ablation   Morbid obesity (HCC)    OSA (obstructive sleep apnea)    intolerant to CPAP and now using an oral device   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 with 31.4 mCi of I-131 on 12/05/2007   Transfusion history    with hip surgery    Patient Active Problem List   Diagnosis Date Noted   OSA (obstructive sleep apnea) 10/16/2013   Pure hypercholesterolemia 10/16/2013   Obesity 10/16/2013   Essential hypertension, benign 10/16/2013   Encounter for long-term (current) use of other medications 10/16/2013    Past Surgical History:  Procedure Laterality Date   ANKLE ARTHROTOMY Left    ankle and foot  reconstruction ;retained hardware   BREAST BIOPSY Bilateral    Many years ago, no scar seen    BREAST SURGERY     x2 biopsies(benign)   CARPAL TUNNEL RELEASE Bilateral    COLONOSCOPY WITH PROPOFOL N/A 06/15/2014   Procedure: COLONOSCOPY WITH PROPOFOL;  Surgeon: Garlan Fair, MD;  Location: [facility];  Service: Endoscopy;  Laterality: N/A;   HIP FRACTURE SURGERY     repaired with cader bone and metal plates   KNEE ARTHROSCOPY Right      OB History     Gravida  0   Para  0   Term  0   Preterm  0   AB  0   Living         SAB  0   IAB  0   Ectopic  0   Multiple      Live Births              Family History  Problem Relation Age of Onset   Thyroid cancer Mother    Lung cancer Mother    Melanoma Mother    Alcoholism Father    Cirrhosis Father    Fibromyalgia Sister    Breast cancer Sister    Breast cancer Maternal Aunt     Social History   Tobacco Use   Smoking status: Former    Years: 3.00    Types: Cigarettes  Smokeless tobacco: Never   Tobacco comments:    Quit '78  Substance Use Topics   Alcohol use: Yes    Comment: ocass   Drug use: No    Home Medications Prior to Admission medications   Medication Sig Start Date End Date Taking? Authorizing Provider  amoxicillin-clavulanate (AUGMENTIN) 875-125 MG tablet Take 1 tablet by mouth every 12 (twelve) hours for 10 days. 01/13/21 01/23/21 Yes Rayna Sexton, PA-C  aspirin 81 MG tablet Take 81 mg by mouth daily.    [provider]  Calcium Carbonate-Vit D-Min (CALCIUM 1200 PO) Take 1 tablet by mouth daily.    [provider]  diphenhydramine-acetaminophen (TYLENOL PM) 25-500 MG TABS Take 1 tablet by mouth at bedtime as needed.    [provider]  fenofibrate 160 MG tablet Take 1 tablet (160 mg total) by mouth daily. 05/27/17   Sueanne Margarita, MD  FLUZONE HIGH-DOSE 0.5 ML SUSY  11/19/13   [provider]  levothyroxine (SYNTHROID, LEVOTHROID) 150 MCG tablet  Take 150 mcg by mouth daily before breakfast.    [provider]  losartan-hydrochlorothiazide (HYZAAR) 50-12.5 MG tablet Take 1 tablet by mouth daily. Please schedule annual appt with Dr. Radford Pax for refills. [phone]. 3rd & final attempt. 04/29/19   Sueanne Margarita, MD  Omeprazole-Sodium Bicarbonate (ZEGERID) 20-1100 MG CAPS capsule Take 1 capsule by mouth as needed (for acid reflux).     [provider]  OVER THE COUNTER MEDICATION Ultimate OMEGA 3 tablets once a day    [provider]  rosuvastatin (CRESTOR) 20 MG tablet Take 1 tablet (20 mg total) by mouth daily. Please make overdue appt with Dr. Radford Pax before anymore refills. 2nd attempt 01/12/19   Sueanne Margarita, MD    Allergies    Patient has no known allergies.  Review of Systems   Review of Systems  All other systems reviewed and are negative. Ten systems reviewed and are negative for acute change, except as noted in the HPI.   Physical Exam Updated Vital Signs BP (!) 145/73 (BP Location: Right Arm)   Pulse 77   Temp 98.8 F (37.1 C) (Oral)   Resp 16   Ht 5\' 6"  (1.676 m)   Wt 133.8 kg   SpO2 96%   BMI 47.61 kg/m   Physical Exam Vitals and nursing note reviewed.  Constitutional:      General: She is not in acute distress.    Appearance: Normal appearance. She is not ill-appearing, toxic-appearing or diaphoretic.  HENT:     Head: Normocephalic and atraumatic.     Right Ear: External ear normal.     Left Ear: External ear normal.     Nose: Nose normal.     Mouth/Throat:     Mouth: Mucous membranes are moist.     Pharynx: Oropharynx is clear. No oropharyngeal exudate or posterior oropharyngeal erythema.     Comments: Broken right lower first molar.  Moderate surrounding erythema and swelling in the region.  No palpable fluctuance.  Uvula midline.  Readily handling secretions.  No hot potato voice.  Soft compartments. Eyes:     Extraocular Movements: Extraocular movements intact.   Cardiovascular:     Rate and Rhythm: Normal rate and regular rhythm.     Pulses: Normal pulses.     Heart sounds: Normal heart sounds. No murmur heard.   No friction rub. No gallop.  Pulmonary:     Effort: Pulmonary effort is normal. No respiratory distress.  Breath sounds: Normal breath sounds. No stridor. No wheezing, rhonchi or rales.  Abdominal:     General: Abdomen is flat.     Palpations: Abdomen is soft.     Tenderness: There is no abdominal tenderness.  Musculoskeletal:        General: Normal range of motion.     Cervical back: Normal range of motion and neck supple. No tenderness.  Skin:    General: Skin is warm and dry.  Neurological:     General: No focal deficit present.     Mental Status: She is alert and oriented to person, place, and time.  Psychiatric:        Mood and Affect: Mood normal.        Behavior: Behavior normal.   ED Results / Procedures / Treatments   Labs (all labs ordered are listed, but only abnormal results are displayed) Labs Reviewed  COMPREHENSIVE METABOLIC PANEL - Abnormal; Notable for the following components:      Result Value   Glucose, Bld 130 (*)    All other components within normal limits  CBC WITH DIFFERENTIAL/PLATELET  TROPONIN I (HIGH SENSITIVITY)  TROPONIN I (HIGH SENSITIVITY)   EKG EKG Interpretation  Date/Time:  Friday January 13 2021 12:07:32 EST Ventricular Rate:  77 PR Interval:  150 QRS Duration: 92 QT Interval:  366 QTC Calculation: 414 R Axis:   -8 Text Interpretation: Normal sinus rhythm Normal ECG No old tracing to compare Confirmed by Calvert Cantor [phone]) on 01/13/2021 3:05:24 PM  Radiology DG Chest 2 View  Result Date: 01/13/2021 CLINICAL DATA:  Palpitations, chest pain EXAM: CHEST - 2 VIEW COMPARISON:  None. FINDINGS: Cardiac size is within normal limits. There are no signs of pulmonary edema or focal pulmonary consolidation. There is minimal blunting of right lateral CP angle. There is no  pneumothorax. IMPRESSION: There are no signs of pulmonary edema or focal pulmonary consolidation. Blunting of right lateral costophrenic angle may be due to pleural thickening or small effusion. Electronically Signed   By: Elmer Picker M.D.   On: 01/13/2021 14:30    Procedures Procedures   Medications Ordered in ED Medications - No data to display  ED Course  I have reviewed the triage vital signs and the nursing notes.  Pertinent labs & imaging results that were available during my care of the patient were reviewed by me and considered in my medical decision making (see chart for details).    MDM Rules/Calculators/A&P                          Pt is a 75 y.o. female who presents to the emergency department from urgent care due to right lower jaw pain and swelling as well as possible ST changes on ECG.  Labs: CBC without abnormalities. CMP with a glucose of 130. Troponin of 8.  Imaging: Chest x-ray shows no signs of pulmonary edema or focal pulmonary consolidation.  Blunting of the right lateral costophrenic angle which may be due to pleural thickening or small effusion.  ECG: Normal sinus rhythm.  I, Rayna Sexton, PA-C, personally reviewed and evaluated these images and lab results as part of my medical decision-making.  Physical exam appears consistent with a developing dental abscess in the right lower portion of the patient's mouth.  She has a broken tooth in the region and is planning on following up with her dental provider next week regarding this.  She also reports a history of  previous dental infections.  Uvula midline.  Soft compartments.  Doubt Ludwig's angina or PTA at this time.  ECG obtained at this visit shows normal sinus rhythm.  Lab work is also reassuring.  Troponin of 8.  Doubt ACS at this time.  Feel that the patient is stable for discharge at this time and she is agreeable.  Will discharge on a course of Augmentin.  Discussed return precautions.  Her  questions were answered and she was amicable at the time of discharge.  Note: Portions of this report may have been transcribed using voice recognition software. Every effort was made to ensure accuracy; however, inadvertent computerized transcription errors may be present.   Final Clinical Impression(s) / ED Diagnoses Final diagnoses:  Dental infection   Rx / DC Orders ED Discharge Orders          Ordered    amoxicillin-clavulanate (AUGMENTIN) 875-125 MG tablet  Every 12 hours        01/13/21 1509             Rayna Sexton, PA-C 01/13/21 1515    Truddie Hidden, MD 01/13/21 2231

## 2021-04-07 DIAGNOSIS — E7800 Pure hypercholesterolemia, unspecified: Secondary | ICD-10-CM | POA: Diagnosis not present

## 2021-04-07 DIAGNOSIS — I10 Essential (primary) hypertension: Secondary | ICD-10-CM | POA: Diagnosis not present

## 2021-04-07 DIAGNOSIS — F5101 Primary insomnia: Secondary | ICD-10-CM | POA: Diagnosis not present

## 2021-04-07 DIAGNOSIS — E890 Postprocedural hypothyroidism: Secondary | ICD-10-CM | POA: Diagnosis not present

## 2021-04-07 DIAGNOSIS — M170 Bilateral primary osteoarthritis of knee: Secondary | ICD-10-CM | POA: Diagnosis not present

## 2021-04-07 DIAGNOSIS — Z0000 Encounter for general adult medical examination without abnormal findings: Secondary | ICD-10-CM | POA: Diagnosis not present

## 2021-04-19 ENCOUNTER — Ambulatory Visit: Payer: Self-pay

## 2021-04-19 ENCOUNTER — Other Ambulatory Visit: Payer: Self-pay

## 2021-04-19 ENCOUNTER — Ambulatory Visit: Payer: Medicare Other | Admitting: Orthopedic Surgery

## 2021-04-19 VITALS — Ht 66.5 in | Wt 294.0 lb

## 2021-04-19 DIAGNOSIS — M79605 Pain in left leg: Secondary | ICD-10-CM | POA: Diagnosis not present

## 2021-04-19 DIAGNOSIS — M25562 Pain in left knee: Secondary | ICD-10-CM

## 2021-04-19 DIAGNOSIS — M25561 Pain in right knee: Secondary | ICD-10-CM

## 2021-04-19 DIAGNOSIS — G8929 Other chronic pain: Secondary | ICD-10-CM | POA: Diagnosis not present

## 2021-04-21 ENCOUNTER — Encounter: Payer: Self-pay | Admitting: Orthopedic Surgery

## 2021-04-21 NOTE — Progress Notes (Signed)
? ?Office Visit Note ?  ?Patient: [facility]           ?Date of Birth: October 12, 1945           ?MRN: [id number] ?Visit Date: 04/19/2021 ?Requested by: Scheryl Marten, [address] ?[place],  [place] [address] ?PCP: Scheryl Marten, PA ? ?Subjective: ?Chief Complaint  ?Patient presents with  ? Right Knee - Pain  ? Left Knee - Pain  ? ? ?HPI: [facility] is a 76 year old patient with bilateral knee pain left worse than right.  She describes locking giving way as well as pain that wakes her from sleep at night on a relatively frequent basis.  Describes stiffness and catching in both knees.  The left knee buckles and locks.  She has a history of left ankle fusion.  She is getting crowns next week.  Teeth are otherwise okay by history from her dentist.  She describes significant decrease in quality of life.  She has had prior cortisone and gel injections with only temporary relief.  She does have a daughter with breast cancer that she has been caring for.  She also has a sister with stage IV cancer.  She has had a history of arthroscopic surgery on the left knee about 30 years ago.  Pain is worse with standing more than walking.  She has tried to go to the gym but has not had much luck in terms of being able to exercise effectively because of the knee. ?             ?ROS: All systems reviewed are negative as they relate to the chief complaint within the history of present illness.  Patient denies  fevers or chills. ? ? ?Assessment & Plan: ?Visit Diagnoses:  ?1. Chronic pain of both knees   ?2. Pain in left leg   ? ? ?Plan: Impression is severe left knee arthritis in a patient who has significant varus deformity and increased body mass index.  Otherwise she is relatively healthy.  Had a long talk today with [facility] about the risk and benefits of surgical intervention particularly in a patient with the increased risk factor of increased BMI.  From a practical standpoint I think it is unlikely for  her to achieve substantial weight loss at this time.  The literature shows that post surgery weight loss is also unlikely to happen.  Nevertheless from a quality of life perspective for [facility] I think the benefits outweigh the risk at this time.  Her arthritis is severe and the deformity will progress making eventual knee replacement even more difficult.  She is taking a high dose of Advil in order to make it through the day.  Plan at this time is left total knee replacement with cemented prostheses and posterior stabilized implants for increased medial lateral stability.  The risk and benefits are discussed with the patient including not limited to infection nerve vessel damage loosening as well as potential need for revision.  We will likely use stemmed tibial implant.  Patient understands the risk and benefits and wishes to proceed.  All questions answered ? ?Follow-Up Instructions: No follow-ups on file.  ? ?Orders:  ?Orders Placed This Encounter  ?Procedures  ? XR KNEE 3 VIEW RIGHT  ? XR Knee 1-2 Views Left  ? XR HIP UNILAT W OR W/O PELVIS 2-3 VIEWS LEFT  ? ?No orders of the defined types were placed in this encounter. ? ? ? ? Procedures: ?No  procedures performed ? ? ?Clinical Data: ?No additional findings. ? ?Objective: ?Vital Signs: Ht 5' 6.5" (1.689 m)   Wt 294 lb (133.4 kg)   BMI 46.74 kg/m?  ? ?Physical Exam:  ? ?Constitutional: Patient appears well-developed ?HEENT:  ?Head: Normocephalic ?Eyes:EOM are normal ?Neck: Normal range of motion ?Cardiovascular: Normal rate ?Pulmonary/chest: Effort normal ?Neurologic: Patient is alert ?Skin: Skin is warm ?Psychiatric: Patient has normal mood and affect ? ? ?Ortho Exam: Ortho exam demonstrates history of ankle fusion on the left with well-healed incision.  Both feet are perfused and sensate.  Pedal pulses palpable.  Varus alignment present.  No groin pain with internal/external rotation of either leg.  Range of motion of the left knee is 5-95 with intact  extensor mechanism.  Skin is intact in the left knee region. ? ?Specialty Comments:  ?No specialty comments available. ? ?Imaging: ?XR HIP UNILAT W OR W/O PELVIS 2-3 VIEWS LEFT ? ?Result Date: 04/21/2021 ?AP pelvis lateral left hip reviewed.  No acute fracture.  Joint spaces maintained with only mild degenerative changes.  Bony detail obscured by soft tissue envelope  ? ? ?PMFS History: ?Patient Active Problem List  ? Diagnosis Date Noted  ? OSA (obstructive sleep apnea) 10/16/2013  ? Pure hypercholesterolemia 10/16/2013  ? Obesity 10/16/2013  ? Essential hypertension, benign 10/16/2013  ? Encounter for long-term (current) use of other medications 10/16/2013  ? ?Past Medical History:  ?Diagnosis Date  ? Allergic rhinitis   ? Arthritis   ? generalized-ankles and knees  ? Dyslipidemia   ? GERD (gastroesophageal reflux disease)   ? H/O: pituitary tumor   ? "irradicated" no problems now  ? Headache   ? migraines in past  ? Hypertension   ? Hypothyroidism   ? Hypothyroidism (acquired)   ? Post-ablation  ? Morbid obesity ([facility])   ? OSA (obstructive sleep apnea)   ? intolerant to CPAP and now using an oral device  ? Toxic solitary thyroid nodule   ? radioactive iodine therapy with 31.4 mCi of I-131 on 12/05/2007  ? Transfusion history   ? with hip surgery  ?  ?Family History  ?Problem Relation Age of Onset  ? Thyroid cancer Mother   ? Lung cancer Mother   ? Melanoma Mother   ? Alcoholism Father   ? Cirrhosis Father   ? Fibromyalgia Sister   ? Breast cancer Sister   ? Breast cancer Maternal Aunt   ?  ?Past Surgical History:  ?Procedure Laterality Date  ? ANKLE ARTHROTOMY Left   ? ankle and foot reconstruction ;retained hardware  ? BREAST BIOPSY Bilateral   ? Many years ago, no scar seen   ? BREAST SURGERY    ? x2 biopsies(benign)  ? CARPAL TUNNEL RELEASE Bilateral   ? COLONOSCOPY WITH PROPOFOL N/A 06/15/2014  ? Procedure: COLONOSCOPY WITH PROPOFOL;  Surgeon: Garlan Fair, MD;  Location: [facility];  Service: Endoscopy;   Laterality: N/A;  ? HIP FRACTURE SURGERY    ? repaired with cader bone and metal plates  ? KNEE ARTHROSCOPY Right   ? ?Social History  ? ?Occupational History  ? Not on file  ?Tobacco Use  ? Smoking status: Former  ?  Years: 3.00  ?  Types: Cigarettes  ? Smokeless tobacco: Never  ? Tobacco comments:  ?  Quit '78  ?Substance and Sexual Activity  ? Alcohol use: Yes  ?  Comment: ocass  ? Drug use: No  ? Sexual activity: Not on file  ? ? ? ? ? ?

## 2021-04-24 ENCOUNTER — Other Ambulatory Visit: Payer: Self-pay

## 2021-05-02 DIAGNOSIS — Z4689 Encounter for fitting and adjustment of other specified devices: Secondary | ICD-10-CM | POA: Diagnosis not present

## 2021-05-15 DIAGNOSIS — Z4689 Encounter for fitting and adjustment of other specified devices: Secondary | ICD-10-CM | POA: Diagnosis not present

## 2021-05-25 ENCOUNTER — Other Ambulatory Visit (HOSPITAL_COMMUNITY): Payer: Medicare Other

## 2021-05-25 ENCOUNTER — Telehealth: Payer: Self-pay | Admitting: *Deleted

## 2021-05-25 NOTE — Care Plan (Signed)
RNCM call to patient earlier in the week to discuss her surgery that was scheduled for a Left total knee replacement with Dr. Marlou Sa on 06/06/21. Discussed that patient is a [facility] bundle patient and she was agreeable to case management. Explained that there are several hard stops related to having an elective joint replacement surgery and one of those is BMI of < 40 along with L7N (if applicapable) of <3.0. Discussed that she would have to work on weight loss to proceed with her surgery. Discussed options including a referral to [facility] and she was agreeable. She was made aware her surgery would be canceled and she was understandably emotional. Updated Dr. Marlou Sa and referral made at his request to [facility] today. Will continue to follow patient's progress to see when it is time to reschedule for her knee replacement.  ?

## 2021-05-25 NOTE — Telephone Encounter (Signed)
Ortho bundle call to patient to discuss cancellation of surgery. Referral was made to [facility]. Will continue to follow patient for needs and progress. ?

## 2021-05-26 DIAGNOSIS — Z0289 Encounter for other administrative examinations: Secondary | ICD-10-CM

## 2021-05-29 DIAGNOSIS — Z4689 Encounter for fitting and adjustment of other specified devices: Secondary | ICD-10-CM | POA: Diagnosis not present

## 2021-06-06 ENCOUNTER — Ambulatory Visit (INDEPENDENT_AMBULATORY_CARE_PROVIDER_SITE_OTHER): Payer: Medicare Other | Admitting: Bariatrics

## 2021-06-06 ENCOUNTER — Ambulatory Visit: Admit: 2021-06-06 | Payer: Medicare Other | Admitting: Orthopedic Surgery

## 2021-06-06 ENCOUNTER — Encounter (INDEPENDENT_AMBULATORY_CARE_PROVIDER_SITE_OTHER): Payer: Self-pay | Admitting: Bariatrics

## 2021-06-06 VITALS — BP 138/82 | HR 74 | Temp 97.9°F | Ht 65.0 in | Wt 291.0 lb

## 2021-06-06 DIAGNOSIS — R0602 Shortness of breath: Secondary | ICD-10-CM | POA: Diagnosis not present

## 2021-06-06 DIAGNOSIS — I10 Essential (primary) hypertension: Secondary | ICD-10-CM

## 2021-06-06 DIAGNOSIS — G4733 Obstructive sleep apnea (adult) (pediatric): Secondary | ICD-10-CM | POA: Diagnosis not present

## 2021-06-06 DIAGNOSIS — E7800 Pure hypercholesterolemia, unspecified: Secondary | ICD-10-CM | POA: Diagnosis not present

## 2021-06-06 DIAGNOSIS — Z1331 Encounter for screening for depression: Secondary | ICD-10-CM | POA: Diagnosis not present

## 2021-06-06 DIAGNOSIS — E668 Other obesity: Secondary | ICD-10-CM

## 2021-06-06 DIAGNOSIS — Z01818 Encounter for other preprocedural examination: Secondary | ICD-10-CM

## 2021-06-06 DIAGNOSIS — E038 Other specified hypothyroidism: Secondary | ICD-10-CM | POA: Diagnosis not present

## 2021-06-06 DIAGNOSIS — R7309 Other abnormal glucose: Secondary | ICD-10-CM

## 2021-06-06 DIAGNOSIS — E559 Vitamin D deficiency, unspecified: Secondary | ICD-10-CM | POA: Diagnosis not present

## 2021-06-06 DIAGNOSIS — Z6841 Body Mass Index (BMI) 40.0 and over, adult: Secondary | ICD-10-CM

## 2021-06-06 DIAGNOSIS — R5383 Other fatigue: Secondary | ICD-10-CM | POA: Diagnosis not present

## 2021-06-06 SURGERY — ARTHROPLASTY, KNEE, TOTAL
Anesthesia: Spinal | Site: Knee | Laterality: Left

## 2021-06-07 ENCOUNTER — Encounter (INDEPENDENT_AMBULATORY_CARE_PROVIDER_SITE_OTHER): Payer: Self-pay | Admitting: Bariatrics

## 2021-06-07 DIAGNOSIS — E8881 Metabolic syndrome: Secondary | ICD-10-CM | POA: Insufficient documentation

## 2021-06-07 DIAGNOSIS — E559 Vitamin D deficiency, unspecified: Secondary | ICD-10-CM | POA: Insufficient documentation

## 2021-06-07 LAB — INSULIN, RANDOM: INSULIN: 23 u[IU]/mL (ref 2.6–24.9)

## 2021-06-07 LAB — VITAMIN D 25 HYDROXY (VIT D DEFICIENCY, FRACTURES): Vit D, 25-Hydroxy: 7.7 ng/mL — ABNORMAL LOW (ref 30.0–100.0)

## 2021-06-07 LAB — HEMOGLOBIN A1C
Est. average glucose Bld gHb Est-mCnc: 111 mg/dL
Hgb A1c MFr Bld: 5.5 % (ref 4.8–5.6)

## 2021-06-20 ENCOUNTER — Encounter (INDEPENDENT_AMBULATORY_CARE_PROVIDER_SITE_OTHER): Payer: Self-pay | Admitting: Bariatrics

## 2021-06-20 ENCOUNTER — Ambulatory Visit (INDEPENDENT_AMBULATORY_CARE_PROVIDER_SITE_OTHER): Payer: Medicare Other | Admitting: Bariatrics

## 2021-06-20 VITALS — BP 125/68 | HR 67 | Temp 97.6°F | Ht 65.0 in | Wt 287.0 lb

## 2021-06-20 DIAGNOSIS — E7800 Pure hypercholesterolemia, unspecified: Secondary | ICD-10-CM

## 2021-06-20 DIAGNOSIS — E8881 Metabolic syndrome: Secondary | ICD-10-CM

## 2021-06-20 DIAGNOSIS — E669 Obesity, unspecified: Secondary | ICD-10-CM

## 2021-06-20 DIAGNOSIS — Z6841 Body Mass Index (BMI) 40.0 and over, adult: Secondary | ICD-10-CM

## 2021-06-20 DIAGNOSIS — I10 Essential (primary) hypertension: Secondary | ICD-10-CM

## 2021-06-20 DIAGNOSIS — E559 Vitamin D deficiency, unspecified: Secondary | ICD-10-CM | POA: Diagnosis not present

## 2021-06-20 MED ORDER — VITAMIN D (ERGOCALCIFEROL) 1.25 MG (50000 UNIT) PO CAPS: 50000.0000 [IU] | ORAL_CAPSULE | ORAL | 0 refills | Status: DC

## 2021-06-20 NOTE — Progress Notes (Signed)
? ? ? ?Chief Complaint:  ? ?OBESITY ?Cassandra Ho (MR# [id number]) is a 76 y.o. female who presents for evaluation and treatment of obesity and related comorbidities. Current BMI is Body mass index is 48.42 kg/m?Marland Kitchen Cassandra Ho has been struggling with her weight for many years and has been unsuccessful in either losing weight, maintaining weight loss, or reaching her healthy weight goal. ? ?Cassandra Ho states that she needs a "double knee replacement" and needs to lose weight with approximately BMI of 40 or below.  ? ?Cassandra Ho is currently in the action stage of change and ready to dedicate time achieving and maintaining a healthier weight. Cassandra Ho is interested in becoming our patient and working on intensive lifestyle modifications including (but not limited to) diet and exercise for weight loss. ? ?Cassandra Ho's habits were reviewed today and are as follows: Her family eats meals together, she thinks her family will eat healthier with her, her desired weight loss is 123 pounds, she started gaining weight post menopause and post ankle surgery, her heaviest weight ever was 292 pounds, she has significant food cravings issues, she skips meals frequently, she frequently eats larger portions than normal, and she struggles with emotional eating. ? ?Depression Screen ?Cassandra Ho's Food and Mood (modified PHQ-9) score was 11. ? ? ?  06/06/2021  ?  9:05 AM  ?Depression screen PHQ 2/9  ?Decreased Interest 3  ?Down, Depressed, Hopeless 1  ?PHQ - 2 Score 4  ?Altered sleeping 3  ?Tired, decreased energy 2  ?Change in appetite 2  ?Feeling bad or failure about yourself  0  ?Trouble concentrating 0  ?Moving slowly or fidgety/restless 0  ?Suicidal thoughts 0  ?PHQ-9 Score 11  ?Difficult doing work/chores Not difficult at all  ? ?Subjective:  ? ?1. Other fatigue ?Aayana denies daytime somnolence and denies waking up still tired. Patient has a history of symptoms of daytime fatigue. Konya generally gets 8 hours of sleep per night,  and states that she has difficulty falling asleep and generally restful sleep. Snoring is present. Apneic episodes are not present. Epworth Sleepiness Score is 3.  Shalynn will continue activities.  ? ?2. SOB (shortness of breath) on exertion ?Nuvia notes increasing shortness of breath with exercising and seems to be worsening over time with weight gain. She notes getting out of breath sooner with activity than she used to. This has not gotten worse recently. Cassandra Ho denies shortness of breath at rest or orthopnea.  ? ?3. OSA (obstructive sleep apnea) ?Cassandra Ho states that she is unable to wear CPAP due to allergies.  ? ?4. Pure hypercholesterolemia ?Cassandra Ho is currently taking Rosuvastatin.  ? ?5. Essential hypertension, benign ?Cassandra Ho is taking Hyzaar currently.  ? ?6. Other specified hypothyroidism ?Cassandra Ho is currently taking Levothyroxine.  ? ?7. Vitamin D deficiency ?Cassandra Ho is taking Calcium, multivitamins, and magnesium.  ? ?8. Elevated glucose ?Cassandra Ho's last glucose level was 130. ? ?Assessment/Plan:  ? ?1. Other fatigue ?Cassandra Ho does feel that her weight is causing her energy to be lower than it should be. Fatigue may be related to obesity, depression or many other causes. Labs will be ordered, and in the meanwhile, Daisi will focus on self care including making healthy food choices, increasing physical activity and focusing on stress reduction. Taneika will gradually increase activities. We will check EKG today.  ?- EKG 12-Lead ? ?2. SOB (shortness of breath) on exertion ?Cassandra Ho does feel that she gets out of breath more easily that she used to when she exercises. Cassandra Ho's shortness of breath appears to  be obesity related and exercise induced. She has agreed to work on weight loss and gradually increase exercise to treat her exercise induced shortness of breath. Will continue to monitor closely.  ? ?3. OSA (obstructive sleep apnea) ?We discussed there importance of restful sleep.  Intensive lifestyle modifications are the first line treatment for this issue. We discussed several lifestyle modifications today and she will continue to work on diet, exercise and weight loss efforts. We will continue to monitor. Orders and follow up as documented in patient record.   ? ?4. Pure hypercholesterolemia ?Cardiovascular risk and specific lipid/LDL goals reviewed.  Cassandra Ho will continue taking Rosuvastatin. We discussed several lifestyle modifications today and Cassandra Ho will continue to work on diet, exercise and weight loss efforts. Orders and follow up as documented in patient record.  ? ?Counseling ?Intensive lifestyle modifications are the first line treatment for this issue. ?Dietary changes: Increase soluble fiber. Decrease simple carbohydrates. ?Exercise changes: Moderate to vigorous-intensity aerobic activity 150 minutes per week if tolerated. ?Lipid-lowering medications: see documented in medical record. ? ?5. Essential hypertension, benign ?Cassandra Ho is working on healthy weight loss and exercise to improve blood pressure control. We will watch for signs of hypotension as she continues her lifestyle modifications. ? ?6. Other specified hypothyroidism ?Cassandra Ho will continue taking Synthroid. Orders and follow up as documented in patient record. ? ?Counseling ?Good thyroid control is important for overall health. Cassandra Ho thyroid levels are dangerous and will not improve weight loss results. ?Counseling: The correct way to take levothyroxine is fasting, with water, separated by at least 30 minutes from breakfast, and separated by more than 4 hours from calcium, iron, multivitamins, acid reflux medications (PPIs).   ? ?7. Vitamin D deficiency ?Low Vitamin D level contributes to fatigue and are associated with obesity, breast, and colon cancer. We will check Vitamin D and Cassandra Ho will follow-up for routine testing of Vitamin D, at least 2-3 times per year to avoid over-replacement. ? ?-  VITAMIN D 25 Hydroxy (Vit-D Deficiency, Fractures) ? ?8. Elevated glucose ?We will check on insulin and A1C today.  ? ?- Insulin, random ?- Hemoglobin A1c ? ?9. Depression screen ?Cassandra Ho had a positive depression screening. Depression is commonly associated with obesity and often results in emotional eating behaviors. We will monitor this closely and work on CBT to help improve the non-hunger eating patterns. Referral to Psychology may be required if no improvement is seen as she continues in our clinic.  ? ?10. Class 3 severe obesity with serious comorbidity and body mass index (BMI) of 45.0 to 49.9 in adult, unspecified obesity type ([place]) ?Cassandra Ho is currently in the action stage of change and her goal is to continue with weight loss efforts. I recommend Shoshannah begin the structured treatment plan as follows: ? ?She has agreed to the Category 3 Plan. ? ?Darleene will continue meal planning and she will continue intentional eating. She has set a goal for size 240 or below. She will have a total knee replacement approximately. We reviewed labs from 08/2021 UA culture, TSH, CMP, and lipid panel.  ? ?Exercise goals: No exercise has been prescribed at this time.  ? ?Behavioral modification strategies: increasing lean protein intake, decreasing simple carbohydrates, increasing vegetables, increasing water intake, decreasing eating out, no skipping meals, meal planning and cooking strategies, keeping healthy foods in the home, and planning for success. ? ?She was informed of the importance of frequent follow-up visits to maximize her success with intensive lifestyle modifications for her multiple health conditions. She was  informed we would discuss her lab results at her next visit unless there is a critical issue that needs to be addressed sooner. Chiffon agreed to keep her next visit at the agreed upon time to discuss these results. ? ?Objective:  ? ?Blood pressure 138/82, pulse 74, temperature 97.9 ?F (36.6 ?C),  height '5\' 5"'$  (1.651 m), weight 291 lb (132 kg), SpO2 92 %. Body mass index is 48.42 kg/m?. ? ?EKG: Normal sinus rhythm, rate 73 bpm. ? ?Indirect Calorimeter completed today shows a VO2 of 329 and a REE of

## 2021-06-21 ENCOUNTER — Encounter: Payer: Medicare Other | Admitting: Orthopedic Surgery

## 2021-06-21 ENCOUNTER — Encounter (INDEPENDENT_AMBULATORY_CARE_PROVIDER_SITE_OTHER): Payer: Self-pay | Admitting: Bariatrics

## 2021-06-28 NOTE — Progress Notes (Signed)
Chief Complaint:   OBESITY Cassandra Ho is here to discuss her progress with her obesity treatment plan along with follow-up of her obesity related diagnoses. Cassandra Ho is on the Category 3 Plan and states she is following her eating plan approximately 50% of the time. Cassandra Ho states she is doing 0 minutes 0 times per week.  Today's visit was #: 2 Starting weight: 291 lbs Starting date: 06/06/2021 Today's weight: 287 lbs Today's date: 06/20/2021 Total lbs lost to date: 4 lbs Total lbs lost since last in-office visit: 4 lbs  Interim History: Cassandra Ho is down 4 lbs since her last visit. It is too much food and not enough vegetables.   Subjective:   1. Essential hypertension Cassandra Ho is currently taking Hyzaar. Her blood pressure is controlled. Her blood pressure today is 125/68.  2. Pure hypercholesterolemia Cassandra Ho is taking Crestor currently.   3. Vitamin D deficiency Cassandra Ho is currently taking Vitamin D. Her last Vitamin D level was 77.  4. Insulin resistance Cassandra Ho is not on medications currently.   Assessment/Plan:   1. Essential hypertension Cassandra Ho will continue medications. She will have no added salt. Handout for IR and Pre-diabetes was provided today. She is working on healthy weight loss and exercise to improve blood pressure control. We will watch for signs of hypotension as she continues her lifestyle modifications.  2. Pure hypercholesterolemia Cardiovascular risk and specific lipid/LDL goals reviewed.  Cassandra Ho will continue taking Crestor. She will have no trans fats and she will minimize saturated fats. We discussed several lifestyle modifications today and Cassandra Ho will continue to work on diet, exercise and weight loss efforts. Orders and follow up as documented in patient record.   Counseling Intensive lifestyle modifications are the first line treatment for this issue. Dietary changes: Increase soluble fiber. Decrease simple carbohydrates. Exercise  changes: Moderate to vigorous-intensity aerobic activity 150 minutes per week if tolerated. Lipid-lowering medications: see documented in medical record.  3. Vitamin D deficiency Low Vitamin D level contributes to fatigue and are associated with obesity, breast, and colon cancer. We will refill prescription Vitamin D 50,000 IU every week for 1 month with no refills and Annjeanette will follow-up for routine testing of Vitamin D, at least 2-3 times per year to avoid over-replacement.  - Vitamin D, Ergocalciferol, (DRISDOL) 1.25 MG (50000 UNIT) CAPS capsule; Take 1 capsule (50,000 Units total) by mouth every 7 (seven) days.  Dispense: 12 capsule; Refill: 0  4. Insulin resistance Cassandra Ho will continue to work on weight loss, exercise, and decreasing simple carbohydrates to help decrease the risk of diabetes. Handouts for IR and Pre-diabetes was provided today. Cassandra Ho agreed to follow-up with [place] as directed to closely monitor her progress.  5. Obesity, Current BMI 47.9 Poppy is currently in the action stage of change. As such, her goal is to continue with weight loss efforts. She has agreed to the Category 2 Plan.   Cassandra Ho will continue meal planning and she will adhere closely to the plan 80-90%. She will increase water. She will review labs Hgb A1C, glucose, and insulin. She was changes to Category 2.   Exercise goals: No exercise has been prescribed at this time.  Behavioral modification strategies: increasing lean protein intake, decreasing simple carbohydrates, increasing vegetables, increasing water intake, decreasing eating out, no skipping meals, meal planning and cooking strategies, keeping healthy foods in the home, and planning for success.  Cassandra Ho has agreed to follow-up with our clinic in 3 weeks with nurse practitioner and 6 weeks with myself.  She was informed of the importance of frequent follow-up visits to maximize her success with intensive lifestyle modifications for her  multiple health conditions.   Objective:   Blood pressure 125/68, pulse 67, temperature 97.6 F (36.4 C), height '5\' 5"'$  (1.651 m), weight 287 lb (130.2 kg), SpO2 94 %. Body mass index is 47.76 kg/m. Cassandra Ho is walking with a cane.   General: Cooperative, alert, well developed, in no acute distress. HEENT: Conjunctivae and lids unremarkable. Cardiovascular: Regular rhythm.  Lungs: Normal work of breathing. Neurologic: No focal deficits.   Lab Results  Component Value Date   CREATININE 0.74 01/13/2021   BUN 23 01/13/2021   NA 138 01/13/2021   K 4.2 01/13/2021   CL 103 01/13/2021   CO2 24 01/13/2021   Lab Results  Component Value Date   ALT 14 01/13/2021   AST 19 01/13/2021   ALKPHOS 68 01/13/2021   BILITOT 0.6 01/13/2021   Lab Results  Component Value Date   HGBA1C 5.5 06/06/2021   Lab Results  Component Value Date   INSULIN 23.0 06/06/2021   No results found for: TSH Lab Results  Component Value Date   CHOL 116 06/11/2016   HDL 42 06/11/2016   LDLCALC 45 04/22/2015   TRIG 149 06/11/2016   CHOLHDL 2.8 04/22/2015   Lab Results  Component Value Date   VD25OH 7.7 (L) 06/06/2021   Lab Results  Component Value Date   WBC 9.4 01/13/2021   HGB 14.9 01/13/2021   HCT 45.9 01/13/2021   MCV 94.6 01/13/2021   PLT 238 01/13/2021   No results found for: IRON, TIBC, FERRITIN  Attestation Statements:   Reviewed by clinician on day of visit: allergies, medications, problem list, medical history, surgical history, family history, social history, and previous encounter notes.  Time spent on visit including pre-visit chart review and post-visit care and charting was 30 minutes.   I, Lizbeth Bark, RMA, am acting as Location manager for [employer], DO.  I have reviewed the above documentation for accuracy and completeness, and I agree with the above. Jearld Lesch, DO

## 2021-07-12 ENCOUNTER — Encounter (INDEPENDENT_AMBULATORY_CARE_PROVIDER_SITE_OTHER): Payer: Self-pay | Admitting: Bariatrics

## 2021-07-12 ENCOUNTER — Encounter (INDEPENDENT_AMBULATORY_CARE_PROVIDER_SITE_OTHER): Payer: Self-pay | Admitting: Nurse Practitioner

## 2021-07-12 ENCOUNTER — Ambulatory Visit (INDEPENDENT_AMBULATORY_CARE_PROVIDER_SITE_OTHER): Payer: Medicare Other | Admitting: Nurse Practitioner

## 2021-07-12 VITALS — BP 138/82 | HR 69 | Temp 97.5°F | Ht 65.0 in | Wt 286.0 lb

## 2021-07-12 DIAGNOSIS — I10 Essential (primary) hypertension: Secondary | ICD-10-CM | POA: Diagnosis not present

## 2021-07-12 DIAGNOSIS — Z6841 Body Mass Index (BMI) 40.0 and over, adult: Secondary | ICD-10-CM

## 2021-07-12 DIAGNOSIS — E669 Obesity, unspecified: Secondary | ICD-10-CM | POA: Diagnosis not present

## 2021-07-12 DIAGNOSIS — E559 Vitamin D deficiency, unspecified: Secondary | ICD-10-CM | POA: Diagnosis not present

## 2021-07-24 NOTE — Progress Notes (Unsigned)
Chief Complaint:   OBESITY Cassandra Ho is here to discuss her progress with her obesity treatment plan along with follow-up of her obesity related diagnoses. Cassandra Ho is on the Category 2 Plan and states she is following her eating plan approximately 95% of the time. Cassandra Ho states she is doing chair exercise and stationary bike for 20-30 minutes 7 times per week.  Today's visit was #: 3 Starting weight: 291 lbs Starting date: 06/06/2021 Today's weight: 286 lbs Today's date: 07/12/2021 Total lbs lost to date: 5 lbs Total lbs lost since last in-office visit: 1 lb  Interim History: Cassandra Ho is struggling with hunger and reports she is frustrated. She wants to lose weight to be able to proceed with knee surgery. After her last visit she decreased from Category 3 to Category 2. Cassandra Ho has been struggling since making change. She is drinking water, coffee and 1/2 diet soda daily. She is leaving for the beach next week.   Subjective:   1. Essential hypertension Cassandra Ho is taking Hyzaar 50-12.5 mg. Her blood pressure was elevated today 138/82. She denies chest pain and palpitations.   2. Vitamin D deficiency Cassandra Ho's last Vitamin D was very low at 7.7. She is taking Vitmain D 50,000 IU weekly. She denies nausea, vomiting, and muscle weakness.   Assessment/Plan:   1. Essential hypertension Cassandra Ho will continue to follow up with her primary care provider. She will continue with her medications as directed. She is working on healthy weight loss and exercise to improve blood pressure control. We will watch for signs of hypotension as she continues her lifestyle modifications.  2. Vitamin D deficiency Low Vitamin D level contributes to fatigue and are associated with obesity, breast, and colon cancer. Cassandra Ho agrees to continue to take prescription Vitamin D 50,000 IU every week and she will follow-up for routine testing of Vitamin D, at least 2-3 times per year to avoid  over-replacement. We discussed side effects. She will discussed DEXA with primary care provider.   3. Obesity, Current BMI 47.6 Cassandra Ho is currently in the action stage of change. As such, her goal is to continue with weight loss efforts. She has agreed to the Category 3 Plan.   Cassandra Ho was increased back to Category 3.   Exercise goals:  As is.   Behavioral modification strategies: increasing lean protein intake, increasing water intake, and planning for success.  Cassandra Ho has agreed to follow-up with our clinic in 3 weeks. She was informed of the importance of frequent follow-up visits to maximize her success with intensive lifestyle modifications for her multiple health conditions.   Objective:   Blood pressure 138/82, pulse 69, temperature (!) 97.5 F (36.4 C), height '5\' 5"'$  (1.651 m), weight 286 lb (129.7 kg), SpO2 93 %. Body mass index is 47.59 kg/m.  General: Cooperative, alert, well developed, in no acute distress. HEENT: Conjunctivae and lids unremarkable. Cardiovascular: Regular rhythm.  Lungs: Normal work of breathing. Neurologic: No focal deficits.   Lab Results  Component Value Date   CREATININE 0.74 01/13/2021   BUN 23 01/13/2021   NA 138 01/13/2021   K 4.2 01/13/2021   CL 103 01/13/2021   CO2 24 01/13/2021   Lab Results  Component Value Date   ALT 14 01/13/2021   AST 19 01/13/2021   ALKPHOS 68 01/13/2021   BILITOT 0.6 01/13/2021   Lab Results  Component Value Date   HGBA1C 5.5 06/06/2021   Lab Results  Component Value Date   INSULIN 23.0 06/06/2021  No results found for: TSH Lab Results  Component Value Date   CHOL 116 06/11/2016   HDL 42 06/11/2016   LDLCALC 45 04/22/2015   TRIG 149 06/11/2016   CHOLHDL 2.8 04/22/2015   Lab Results  Component Value Date   VD25OH 7.7 (L) 06/06/2021   Lab Results  Component Value Date   WBC 9.4 01/13/2021   HGB 14.9 01/13/2021   HCT 45.9 01/13/2021   MCV 94.6 01/13/2021   PLT 238 01/13/2021   No  results found for: IRON, TIBC, FERRITIN  Attestation Statements:   Reviewed by clinician on day of visit: allergies, medications, problem list, medical history, surgical history, family history, social history, and previous encounter notes.  Time spent on visit including pre-visit chart review and post-visit care and charting was 30 minutes.   I, Lizbeth Bark, RMA, am acting as Location manager for Everardo Pacific, FNP.  I have reviewed the above documentation for accuracy and completeness, and I agree with the above. Everardo Pacific, FNP

## 2021-08-02 ENCOUNTER — Encounter (INDEPENDENT_AMBULATORY_CARE_PROVIDER_SITE_OTHER): Payer: Self-pay | Admitting: Bariatrics

## 2021-08-02 ENCOUNTER — Ambulatory Visit (INDEPENDENT_AMBULATORY_CARE_PROVIDER_SITE_OTHER): Payer: Medicare Other | Admitting: Bariatrics

## 2021-08-02 VITALS — BP 153/82 | HR 63 | Temp 97.6°F | Ht 65.0 in | Wt 283.0 lb

## 2021-08-02 DIAGNOSIS — E669 Obesity, unspecified: Secondary | ICD-10-CM | POA: Diagnosis not present

## 2021-08-02 DIAGNOSIS — I10 Essential (primary) hypertension: Secondary | ICD-10-CM

## 2021-08-02 DIAGNOSIS — Z6841 Body Mass Index (BMI) 40.0 and over, adult: Secondary | ICD-10-CM | POA: Diagnosis not present

## 2021-08-02 DIAGNOSIS — E8881 Metabolic syndrome: Secondary | ICD-10-CM

## 2021-08-02 DIAGNOSIS — E88819 Insulin resistance, unspecified: Secondary | ICD-10-CM

## 2021-08-02 NOTE — Progress Notes (Signed)
Chief Complaint:   OBESITY Cassandra Ho is here to discuss her progress with her obesity treatment plan along with follow-up of her obesity related diagnoses. Cassandra Ho is on the Category 3 Plan and states she is following her eating plan approximately 75% of the time. Cassandra Ho states she is doing chair exercise for 20-30 minutes 7 times per week.  Today's visit was #: 4 Starting weight: 291 lbs Starting date: 06/06/2021 Today's weight: 283 lbs Today's date: 08/02/2021 Total lbs lost to date: 8 lbs Total lbs lost since last in-office visit: 3 lbs  Interim History: Cassandra Ho is down another 3 lbs since her last visit. She is trying to get in all her protein. She is doing well with her water.   Subjective:   1. Insulin resistance Cassandra Ho is not on medications currently.   2. Essential hypertension Cassandra Ho is currently taking Hyzaar.   Assessment/Plan:   1. Insulin resistance Cassandra Ho will stay active. She will continue to decrease carbohydrates. She will continue to work on weight loss, exercise, and decreasing simple carbohydrates to help decrease the risk of diabetes. Cassandra Ho agreed to follow-up with Cassandra Ho as directed to closely monitor her progress.  2. Essential hypertension Cassandra Ho will continue taking Hyzaar. She is working on healthy weight loss and exercise to improve blood pressure control. We will watch for signs of hypotension as she continues her lifestyle modifications.  3. Obesity, Current BMI 47.1 Cassandra Ho is currently in the action stage of change. As such, her goal is to continue with weight loss efforts. She has agreed to the Category 3 Plan.   Cassandra Ho will continue meal planning and she will be mindful eating.   Exercise goals:  Cassandra Ho will do some walking and chair exercise.   Behavioral modification strategies: increasing lean protein intake, decreasing simple carbohydrates, increasing vegetables, increasing water intake, decreasing eating out, no skipping  meals, meal planning and cooking strategies, keeping healthy foods in the home, and planning for success.  Cassandra Ho has agreed to follow-up with our clinic in 2-3 weeks. She was informed of the importance of frequent follow-up visits to maximize her success with intensive lifestyle modifications for her multiple health conditions.   Objective:   Blood pressure (!) 153/82, pulse 63, temperature 97.6 F (36.4 C), height '5\' 5"'$  (1.651 m), weight 283 lb (128.4 kg), SpO2 96 %. Body mass index is 47.09 kg/m. Cassandra Ho is using a cane today.   General: Cooperative, alert, well developed, in no acute distress. HEENT: Conjunctivae and lids unremarkable. Cardiovascular: Regular rhythm.  Lungs: Normal work of breathing. Neurologic: No focal deficits.   Lab Results  Component Value Date   CREATININE 0.74 01/13/2021   BUN 23 01/13/2021   NA 138 01/13/2021   K 4.2 01/13/2021   CL 103 01/13/2021   CO2 24 01/13/2021   Lab Results  Component Value Date   ALT 14 01/13/2021   AST 19 01/13/2021   ALKPHOS 68 01/13/2021   BILITOT 0.6 01/13/2021   Lab Results  Component Value Date   HGBA1C 5.5 06/06/2021   Lab Results  Component Value Date   INSULIN 23.0 06/06/2021   No results found for: "TSH" Lab Results  Component Value Date   CHOL 116 06/11/2016   HDL 42 06/11/2016   LDLCALC 45 04/22/2015   TRIG 149 06/11/2016   CHOLHDL 2.8 04/22/2015   Lab Results  Component Value Date   VD25OH 7.7 (L) 06/06/2021   Lab Results  Component Value Date   WBC 9.4 01/13/2021  HGB 14.9 01/13/2021   HCT 45.9 01/13/2021   MCV 94.6 01/13/2021   PLT 238 01/13/2021   No results found for: "IRON", "TIBC", "FERRITIN"  Attestation Statements:   Reviewed by clinician on day of visit: allergies, medications, problem list, medical history, surgical history, family history, social history, and previous encounter notes.Jearld Lesch, DO

## 2021-08-07 ENCOUNTER — Encounter (INDEPENDENT_AMBULATORY_CARE_PROVIDER_SITE_OTHER): Payer: Self-pay | Admitting: Bariatrics

## 2021-08-29 ENCOUNTER — Ambulatory Visit (INDEPENDENT_AMBULATORY_CARE_PROVIDER_SITE_OTHER): Payer: Medicare Other | Admitting: Bariatrics

## 2021-08-30 ENCOUNTER — Encounter (INDEPENDENT_AMBULATORY_CARE_PROVIDER_SITE_OTHER): Payer: Self-pay | Admitting: Bariatrics

## 2021-08-30 ENCOUNTER — Ambulatory Visit (INDEPENDENT_AMBULATORY_CARE_PROVIDER_SITE_OTHER): Payer: Medicare Other | Admitting: Bariatrics

## 2021-08-30 VITALS — BP 166/83 | HR 78 | Temp 97.9°F | Ht 65.0 in | Wt 283.0 lb

## 2021-08-30 DIAGNOSIS — I10 Essential (primary) hypertension: Secondary | ICD-10-CM

## 2021-08-30 DIAGNOSIS — E559 Vitamin D deficiency, unspecified: Secondary | ICD-10-CM

## 2021-08-30 DIAGNOSIS — Z6841 Body Mass Index (BMI) 40.0 and over, adult: Secondary | ICD-10-CM

## 2021-08-30 DIAGNOSIS — E669 Obesity, unspecified: Secondary | ICD-10-CM

## 2021-08-30 DIAGNOSIS — Z87891 Personal history of nicotine dependence: Secondary | ICD-10-CM

## 2021-08-30 MED ORDER — LOSARTAN POTASSIUM-HCTZ 100-25 MG PO TABS: 1.0000 | ORAL_TABLET | Freq: Every day | ORAL | 0 refills | Status: DC

## 2021-08-30 MED ORDER — VITAMIN D (ERGOCALCIFEROL) 1.25 MG (50000 UNIT) PO CAPS: 50000.0000 [IU] | ORAL_CAPSULE | ORAL | 0 refills | Status: DC

## 2021-09-04 NOTE — Progress Notes (Unsigned)
Chief Complaint:   OBESITY Cassandra Ho is here to discuss her progress with her obesity treatment plan along with follow-up of her obesity related diagnoses. Cassandra Ho is on the Category 3 Plan and states she is following her eating plan approximately 75% of the time. Cassandra Ho states she is doing 0 minutes 0 times per week.  Today's visit was #: 5 Starting weight: 291 lbs Starting date: 06/06/2021 Today's weight: 283 lbs Today's date: 08/30/2021 Total lbs lost to date: 8 Total lbs lost since last in-office visit: 0  Interim History: Cassandra Ho's weight remains the same.  She has been traveling and eating more sodium.  Subjective:   1. Vitamin D deficiency Cassandra Ho is taking vitamin D prescription.  2. Essential hypertension, benign Cassandra Ho is taking Hyzaar.  She notes increased stress. Her blood pressure is elevated today and was elevated at her last visit.  Assessment/Plan:   1. Vitamin D deficiency Cassandra Ho will continue prescription vitamin D 50,000 units once weekly, and we will refill for 90 days.  - Vitamin D, Ergocalciferol, (DRISDOL) 1.25 MG (50000 UNIT) CAPS capsule; Take 1 capsule (50,000 Units total) by mouth every 7 (seven) days.  Dispense: 12 capsule; Refill: 0  2. Essential hypertension, benign Cassandra Ho agreed to increase Hyzaar to 100-25 mg once daily, and we will refill for 1 month.  - losartan-hydrochlorothiazide (HYZAAR) 100-25 MG tablet; Take 1 tablet by mouth daily.  Dispense: 30 tablet; Refill: 0  3. Obesity, Current BMI 47.1 Cassandra Ho is currently in the action stage of change. As such, her goal is to continue with weight loss efforts. She has agreed to the Category 3 Plan.   Cassandra Ho will get back on track, and she will increase her water intake.  Exercise goals: No exercise has been prescribed at this time.  Behavioral modification strategies: increasing lean protein intake, decreasing simple carbohydrates, increasing vegetables, increasing water  intake, decreasing eating out, no skipping meals, meal planning and cooking strategies, keeping healthy foods in the home, and planning for success.  Cassandra Ho has agreed to follow-up with our clinic in 4 weeks. She was informed of the importance of frequent follow-up visits to maximize her success with intensive lifestyle modifications for her multiple health conditions.   Objective:   Blood pressure (!) 166/83, pulse 78, temperature 97.9 F (36.6 C), height '5\' 5"'$  (1.651 m), weight 283 lb (128.4 kg), SpO2 98 %. Body mass index is 47.09 kg/m.  General: Cooperative, alert, well developed, in no acute distress. HEENT: Conjunctivae and lids unremarkable. Cardiovascular: Regular rhythm.  Lungs: Normal work of breathing. Neurologic: No focal deficits.   Lab Results  Component Value Date   CREATININE 0.74 01/13/2021   BUN 23 01/13/2021   NA 138 01/13/2021   K 4.2 01/13/2021   CL 103 01/13/2021   CO2 24 01/13/2021   Lab Results  Component Value Date   ALT 14 01/13/2021   AST 19 01/13/2021   ALKPHOS 68 01/13/2021   BILITOT 0.6 01/13/2021   Lab Results  Component Value Date   HGBA1C 5.5 06/06/2021   Lab Results  Component Value Date   INSULIN 23.0 06/06/2021   No results found for: "TSH" Lab Results  Component Value Date   CHOL 116 06/11/2016   HDL 42 06/11/2016   LDLCALC 45 04/22/2015   TRIG 149 06/11/2016   CHOLHDL 2.8 04/22/2015   Lab Results  Component Value Date   VD25OH 7.7 (L) 06/06/2021   Lab Results  Component Value Date   WBC 9.4 01/13/2021  HGB 14.9 01/13/2021   HCT 45.9 01/13/2021   MCV 94.6 01/13/2021   PLT 238 01/13/2021   No results found for: "IRON", "TIBC", "FERRITIN"  Attestation Statements:   Reviewed by clinician on day of visit: allergies, medications, problem list, medical history, surgical history, family history, social history, and previous encounter notes.   Wilhemena Durie, am acting as Location manager for [employer], DO.  I  have reviewed the above documentation for accuracy and completeness, and I agree with the above. Jearld Lesch, DO

## 2021-09-05 ENCOUNTER — Encounter (INDEPENDENT_AMBULATORY_CARE_PROVIDER_SITE_OTHER): Payer: Self-pay | Admitting: Bariatrics

## 2021-09-22 ENCOUNTER — Other Ambulatory Visit (INDEPENDENT_AMBULATORY_CARE_PROVIDER_SITE_OTHER): Payer: Self-pay | Admitting: Bariatrics

## 2021-09-22 DIAGNOSIS — I10 Essential (primary) hypertension: Secondary | ICD-10-CM

## 2021-09-27 ENCOUNTER — Encounter (INDEPENDENT_AMBULATORY_CARE_PROVIDER_SITE_OTHER): Payer: Self-pay

## 2021-10-02 ENCOUNTER — Ambulatory Visit (INDEPENDENT_AMBULATORY_CARE_PROVIDER_SITE_OTHER): Payer: Medicare Other | Admitting: Bariatrics

## 2021-10-02 DIAGNOSIS — Z4689 Encounter for fitting and adjustment of other specified devices: Secondary | ICD-10-CM | POA: Diagnosis not present

## 2021-10-04 ENCOUNTER — Other Ambulatory Visit: Payer: Self-pay | Admitting: Obstetrics and Gynecology

## 2021-10-04 DIAGNOSIS — Z1231 Encounter for screening mammogram for malignant neoplasm of breast: Secondary | ICD-10-CM

## 2021-10-05 ENCOUNTER — Ambulatory Visit
Admission: RE | Admit: 2021-10-05 | Discharge: 2021-10-05 | Disposition: A | Discharge status: Home / Self Care | Payer: Medicare Other | Source: Ambulatory Visit | Attending: Obstetrics and Gynecology | Admitting: Obstetrics and Gynecology

## 2021-10-05 DIAGNOSIS — Z1231 Encounter for screening mammogram for malignant neoplasm of breast: Secondary | ICD-10-CM | POA: Diagnosis not present

## 2021-10-09 ENCOUNTER — Other Ambulatory Visit: Payer: Self-pay | Admitting: Obstetrics and Gynecology

## 2021-10-09 DIAGNOSIS — R928 Other abnormal and inconclusive findings on diagnostic imaging of breast: Secondary | ICD-10-CM

## 2021-10-10 ENCOUNTER — Encounter (INDEPENDENT_AMBULATORY_CARE_PROVIDER_SITE_OTHER): Payer: Self-pay | Admitting: Nurse Practitioner

## 2021-10-10 ENCOUNTER — Ambulatory Visit (INDEPENDENT_AMBULATORY_CARE_PROVIDER_SITE_OTHER): Payer: Medicare Other | Admitting: Nurse Practitioner

## 2021-10-10 VITALS — BP 127/83 | HR 81 | Temp 98.0°F | Ht 65.0 in | Wt 278.0 lb

## 2021-10-10 DIAGNOSIS — E559 Vitamin D deficiency, unspecified: Secondary | ICD-10-CM

## 2021-10-10 DIAGNOSIS — Z6841 Body Mass Index (BMI) 40.0 and over, adult: Secondary | ICD-10-CM | POA: Diagnosis not present

## 2021-10-10 DIAGNOSIS — E669 Obesity, unspecified: Secondary | ICD-10-CM | POA: Diagnosis not present

## 2021-10-10 DIAGNOSIS — I10 Essential (primary) hypertension: Secondary | ICD-10-CM | POA: Diagnosis not present

## 2021-10-10 MED ORDER — LOSARTAN POTASSIUM-HCTZ 100-25 MG PO TABS: 1.0000 | ORAL_TABLET | Freq: Every day | ORAL | 0 refills | Status: DC

## 2021-10-18 NOTE — Progress Notes (Signed)
Chief Complaint:   OBESITY Cassandra Ho is here to discuss her progress with her obesity treatment plan along with follow-up of her obesity related diagnoses. Cassandra Ho is on the Category 3 Plan and states she is following her eating plan approximately 60% of the time. Cassandra Ho states she is doing chair exercises 15-20 minutes 7 times per week.  Today's visit was #: 6 Starting weight: 291 lbs Starting date: 06/06/2021 Today's weight: 278 lbs Today's date: 10/10/2021 Total lbs lost to date: 13 lbs Total lbs lost since last in-office visit: 5  Interim History: Cassandra Ho has done well with weight loss. Struggling with eating enough protein. Protein feels heavy to her due to the heat. She is leaving for the beach this weekend. Not skipping meals. Drinking water, coffee and diet soda.  Subjective:   1. Essential hypertension, benign Cassandra Ho is taking Hyzaar 100-25 mg daily. Denies side effects  2. Vitamin D deficiency Cassandra Ho is currently taking prescription Vit D 50,000 IU once a week. Denies side effects.  Denies any nausea, vomiting or muscle weakness.  Assessment/Plan:   1. Essential hypertension, benign We will refill Hyzaar 100-25 mg daily for 1 month with 0 refills.  Cassandra Ho is working on healthy weight loss and exercise to improve blood pressure control. We will watch for signs of hypotension as she continues her lifestyle modifications.   -Refill losartan-hydrochlorothiazide (HYZAAR) 100-25 MG tablet; Take 1 tablet by mouth daily.  Dispense: 30 tablet; Refill: 0  2. Vitamin D deficiency Continue taking Vit D 50,000 IU weekly. Side effects discussed.   Low Vitamin D level contributes to fatigue and are associated with obesity, breast, and colon cancer. She agrees to continue to take prescription Vitamin D '@50'$ ,000 IU every week and will follow-up for routine testing of Vitamin D, at least 2-3 times per year to avoid over-replacement.   3. Obesity, Current BMI 46.3 Cassandra Ho  is currently in the action stage of change. As such, her goal is to continue with weight loss efforts. She has agreed to the Category 3 Plan.   Exercise goals: As is.  Behavioral modification strategies: increasing lean protein intake, increasing water intake, and no skipping meals.  Cassandra Ho has agreed to follow-up with our clinic in 4 weeks. She was informed of the importance of frequent follow-up visits to maximize her success with intensive lifestyle modifications for her multiple health conditions.   Objective:   Blood pressure 127/83, pulse 81, temperature 98 F (36.7 C), height '5\' 5"'$  (1.651 m), weight 278 lb (126.1 kg), SpO2 95 %. Body mass index is 46.26 kg/m.  General: Cooperative, alert, well developed, in no acute distress. HEENT: Conjunctivae and lids unremarkable. Cardiovascular: Regular rhythm.  Lungs: Normal work of breathing. Neurologic: No focal deficits.   Lab Results  Component Value Date   CREATININE 0.74 01/13/2021   BUN 23 01/13/2021   NA 138 01/13/2021   K 4.2 01/13/2021   CL 103 01/13/2021   CO2 24 01/13/2021   Lab Results  Component Value Date   ALT 14 01/13/2021   AST 19 01/13/2021   ALKPHOS 68 01/13/2021   BILITOT 0.6 01/13/2021   Lab Results  Component Value Date   HGBA1C 5.5 06/06/2021   Lab Results  Component Value Date   INSULIN 23.0 06/06/2021   No results found for: "TSH" Lab Results  Component Value Date   CHOL 116 06/11/2016   HDL 42 06/11/2016   LDLCALC 45 04/22/2015   TRIG 149 06/11/2016   CHOLHDL 2.8 04/22/2015  Lab Results  Component Value Date   VD25OH 7.7 (L) 06/06/2021   Lab Results  Component Value Date   WBC 9.4 01/13/2021   HGB 14.9 01/13/2021   HCT 45.9 01/13/2021   MCV 94.6 01/13/2021   PLT 238 01/13/2021   No results found for: "IRON", "TIBC", "FERRITIN"  Attestation Statements:   Reviewed by clinician on day of visit: allergies, medications, problem list, medical history, surgical history, family  history, social history, and previous encounter notes.  I, Brendell Tyus, RMA, am acting as transcriptionist for Everardo Pacific, FNP.  I have reviewed the above documentation for accuracy and completeness, and I agree with the above. Everardo Pacific, FNP

## 2021-10-24 ENCOUNTER — Other Ambulatory Visit: Payer: Self-pay | Admitting: Obstetrics and Gynecology

## 2021-10-24 ENCOUNTER — Ambulatory Visit
Admission: RE | Admit: 2021-10-24 | Discharge: 2021-10-24 | Disposition: A | Discharge status: Home / Self Care | Payer: Medicare Other | Source: Ambulatory Visit | Attending: Obstetrics and Gynecology | Admitting: Obstetrics and Gynecology

## 2021-10-24 DIAGNOSIS — R921 Mammographic calcification found on diagnostic imaging of breast: Secondary | ICD-10-CM | POA: Diagnosis not present

## 2021-10-24 DIAGNOSIS — R928 Other abnormal and inconclusive findings on diagnostic imaging of breast: Secondary | ICD-10-CM | POA: Diagnosis not present

## 2021-11-02 ENCOUNTER — Other Ambulatory Visit (INDEPENDENT_AMBULATORY_CARE_PROVIDER_SITE_OTHER): Payer: Self-pay | Admitting: Nurse Practitioner

## 2021-11-02 DIAGNOSIS — I10 Essential (primary) hypertension: Secondary | ICD-10-CM

## 2021-11-03 ENCOUNTER — Other Ambulatory Visit: Payer: Self-pay | Admitting: Obstetrics and Gynecology

## 2021-11-03 ENCOUNTER — Ambulatory Visit
Admission: RE | Admit: 2021-11-03 | Discharge: 2021-11-03 | Disposition: A | Discharge status: Home / Self Care | Payer: Medicare Other | Source: Ambulatory Visit | Attending: Obstetrics and Gynecology | Admitting: Obstetrics and Gynecology

## 2021-11-03 DIAGNOSIS — N6012 Diffuse cystic mastopathy of left breast: Secondary | ICD-10-CM | POA: Diagnosis not present

## 2021-11-03 DIAGNOSIS — R921 Mammographic calcification found on diagnostic imaging of breast: Secondary | ICD-10-CM

## 2021-11-08 ENCOUNTER — Ambulatory Visit: Payer: Self-pay | Admitting: Bariatrics

## 2021-11-16 ENCOUNTER — Ambulatory Visit: Payer: Medicare Other | Admitting: Bariatrics

## 2021-11-24 ENCOUNTER — Other Ambulatory Visit (INDEPENDENT_AMBULATORY_CARE_PROVIDER_SITE_OTHER): Payer: Self-pay | Admitting: Nurse Practitioner

## 2021-11-24 DIAGNOSIS — I10 Essential (primary) hypertension: Secondary | ICD-10-CM

## 2021-11-27 ENCOUNTER — Encounter: Payer: Self-pay | Admitting: Bariatrics

## 2021-11-27 ENCOUNTER — Ambulatory Visit (INDEPENDENT_AMBULATORY_CARE_PROVIDER_SITE_OTHER): Payer: Medicare Other | Admitting: Bariatrics

## 2021-11-27 VITALS — BP 150/77 | HR 83 | Temp 97.5°F | Ht 65.0 in | Wt 275.0 lb

## 2021-11-27 DIAGNOSIS — E669 Obesity, unspecified: Secondary | ICD-10-CM

## 2021-11-27 DIAGNOSIS — E559 Vitamin D deficiency, unspecified: Secondary | ICD-10-CM | POA: Diagnosis not present

## 2021-11-27 DIAGNOSIS — Z6841 Body Mass Index (BMI) 40.0 and over, adult: Secondary | ICD-10-CM

## 2021-11-27 DIAGNOSIS — I10 Essential (primary) hypertension: Secondary | ICD-10-CM | POA: Diagnosis not present

## 2021-11-27 MED ORDER — VITAMIN D (ERGOCALCIFEROL) 1.25 MG (50000 UNIT) PO CAPS: 50000.0000 [IU] | ORAL_CAPSULE | ORAL | 0 refills | Status: DC

## 2021-11-27 MED ORDER — LOSARTAN POTASSIUM-HCTZ 100-25 MG PO TABS: 1.0000 | ORAL_TABLET | Freq: Every day | ORAL | 0 refills | Status: DC

## 2021-12-04 ENCOUNTER — Encounter: Payer: Self-pay | Admitting: Bariatrics

## 2021-12-04 NOTE — Progress Notes (Signed)
Chief Complaint:   OBESITY Cassandra Ho is here to discuss her progress with her obesity treatment plan along with follow-up of her obesity related diagnoses. Cassandra Ho is on the Category 3 Plan and states she is following her eating plan approximately 0% of the time. Cassandra Ho states she is doing 0 minutes 0 times per week.  Today's visit was #: 7 Starting weight: 291 lbs Starting date: 06/06/2021 Today's weight: 275 lbs Today's date: 11/27/2021 Total lbs lost to date: 16 Total lbs lost since last in-office visit: 3  Interim History: Cassandra Ho is down 3 lbs since her last visit. She has been sick with an UTI and COVID. She is taking probiotics.   Subjective:   1. Vitamin D deficiency Cassandra Ho is taking Vitamin D as directed.   2. Essential hypertension, benign Cassandra Ho is taking Hyzaar, and she has been taking cold medicine. Her blood pressure is slightly elevated today.   Assessment/Plan:   1. Vitamin D deficiency We will refill prescription Vitamin D for 90 days. Cassandra Ho will follow-up for routine testing of Vitamin D, at least 2-3 times per year to avoid over-replacement.  - Vitamin D, Ergocalciferol, (DRISDOL) 1.25 MG (50000 UNIT) CAPS capsule; Take 1 capsule (50,000 Units total) by mouth every 7 (seven) days.  Dispense: 12 capsule; Refill: 0  2. Essential hypertension, benign Cassandra Ho will continue Hyzaar 100-25 mg once daily, and we will refill for 1 month.  - losartan-hydrochlorothiazide (HYZAAR) 100-25 MG tablet; Take 1 tablet by mouth daily.  Dispense: 30 tablet; Refill: 0  3. Obesity, Current BMI 45.8 Cassandra Ho is currently in the action stage of change. As such, her goal is to continue with weight loss efforts. She has agreed to keeping a food journal and adhering to recommended goals of 1500 calories and 90 grams of protein daily.   She will adhere more closely to the plan 80-90%. Fiber sheet was given. We will recheck fasting labs at her next visit.  Exercise  goals: No exercise has been prescribed at this time.  Behavioral modification strategies: increasing lean protein intake, decreasing simple carbohydrates, increasing vegetables, increasing water intake, decreasing eating out, no skipping meals, meal planning and cooking strategies, keeping healthy foods in the home, and planning for success.  Cassandra Ho has agreed to follow-up with our clinic in 2 to 3 weeks. She was informed of the importance of frequent follow-up visits to maximize her success with intensive lifestyle modifications for her multiple health conditions.   Objective:   Blood pressure (!) 150/77, pulse 83, temperature (!) 97.5 F (36.4 C), height '5\' 5"'$  (1.651 m), weight 275 lb (124.7 kg), SpO2 97 %. Body mass index is 45.76 kg/m.  General: Cooperative, alert, well developed, in no acute distress. HEENT: Conjunctivae and lids unremarkable. Cardiovascular: Regular rhythm.  Lungs: Normal work of breathing. Neurologic: No focal deficits.   Lab Results  Component Value Date   CREATININE 0.74 01/13/2021   BUN 23 01/13/2021   NA 138 01/13/2021   K 4.2 01/13/2021   CL 103 01/13/2021   CO2 24 01/13/2021   Lab Results  Component Value Date   ALT 14 01/13/2021   AST 19 01/13/2021   ALKPHOS 68 01/13/2021   BILITOT 0.6 01/13/2021   Lab Results  Component Value Date   HGBA1C 5.5 06/06/2021   Lab Results  Component Value Date   INSULIN 23.0 06/06/2021   No results found for: "TSH" Lab Results  Component Value Date   CHOL 116 06/11/2016   HDL 42 06/11/2016  LDLCALC 45 04/22/2015   TRIG 149 06/11/2016   CHOLHDL 2.8 04/22/2015   Lab Results  Component Value Date   VD25OH 7.7 (L) 06/06/2021   Lab Results  Component Value Date   WBC 9.4 01/13/2021   HGB 14.9 01/13/2021   HCT 45.9 01/13/2021   MCV 94.6 01/13/2021   PLT 238 01/13/2021   No results found for: "IRON", "TIBC", "FERRITIN"  Attestation Statements:   Reviewed by clinician on day of visit:  allergies, medications, problem list, medical history, surgical history, family history, social history, and previous encounter notes.   Wilhemena Durie, am acting as Location manager for [employer], DO.  I have reviewed the above documentation for accuracy and completeness, and I agree with the above. Jearld Lesch, DO

## 2021-12-18 ENCOUNTER — Ambulatory Visit: Payer: Medicare Other | Admitting: Nurse Practitioner

## 2021-12-20 ENCOUNTER — Other Ambulatory Visit: Payer: Self-pay | Admitting: Bariatrics

## 2021-12-20 DIAGNOSIS — I10 Essential (primary) hypertension: Secondary | ICD-10-CM

## 2021-12-22 ENCOUNTER — Other Ambulatory Visit: Payer: Self-pay | Admitting: Bariatrics

## 2021-12-22 DIAGNOSIS — I10 Essential (primary) hypertension: Secondary | ICD-10-CM

## 2021-12-26 ENCOUNTER — Ambulatory Visit: Payer: Medicare Other | Admitting: Nurse Practitioner

## 2022-02-05 DIAGNOSIS — Z4689 Encounter for fitting and adjustment of other specified devices: Secondary | ICD-10-CM | POA: Diagnosis not present

## 2022-03-02 ENCOUNTER — Encounter: Payer: Self-pay | Admitting: Bariatrics

## 2022-04-16 DIAGNOSIS — E7800 Pure hypercholesterolemia, unspecified: Secondary | ICD-10-CM | POA: Diagnosis not present

## 2022-04-16 DIAGNOSIS — Z0000 Encounter for general adult medical examination without abnormal findings: Secondary | ICD-10-CM | POA: Diagnosis not present

## 2022-04-16 DIAGNOSIS — I10 Essential (primary) hypertension: Secondary | ICD-10-CM | POA: Diagnosis not present

## 2022-04-16 DIAGNOSIS — E890 Postprocedural hypothyroidism: Secondary | ICD-10-CM | POA: Diagnosis not present

## 2022-04-16 DIAGNOSIS — F5101 Primary insomnia: Secondary | ICD-10-CM | POA: Diagnosis not present

## 2022-04-16 DIAGNOSIS — Z13820 Encounter for screening for osteoporosis: Secondary | ICD-10-CM | POA: Diagnosis not present

## 2022-04-16 DIAGNOSIS — Z9181 History of falling: Secondary | ICD-10-CM | POA: Diagnosis not present

## 2022-04-16 DIAGNOSIS — M170 Bilateral primary osteoarthritis of knee: Secondary | ICD-10-CM | POA: Diagnosis not present

## 2022-05-28 DIAGNOSIS — Z4689 Encounter for fitting and adjustment of other specified devices: Secondary | ICD-10-CM | POA: Diagnosis not present

## 2022-06-06 DIAGNOSIS — Z4689 Encounter for fitting and adjustment of other specified devices: Secondary | ICD-10-CM | POA: Diagnosis not present

## 2022-10-23 DIAGNOSIS — Z4689 Encounter for fitting and adjustment of other specified devices: Secondary | ICD-10-CM | POA: Diagnosis not present

## 2022-11-30 ENCOUNTER — Ambulatory Visit: Payer: Medicare Other | Admitting: Obstetrics

## 2022-12-07 ENCOUNTER — Ambulatory Visit: Payer: Medicare Other | Admitting: Obstetrics

## 2023-01-07 ENCOUNTER — Other Ambulatory Visit (HOSPITAL_COMMUNITY)
Admission: RE | Admit: 2023-01-07 | Discharge: 2023-01-07 | Disposition: A | Discharge status: Home / Self Care | Payer: Medicare Other | Source: Other Acute Inpatient Hospital | Attending: Obstetrics | Admitting: Obstetrics

## 2023-01-07 ENCOUNTER — Encounter: Payer: Self-pay | Admitting: Obstetrics

## 2023-01-07 ENCOUNTER — Telehealth: Payer: Self-pay

## 2023-01-07 ENCOUNTER — Ambulatory Visit: Payer: Medicare Other | Admitting: Obstetrics

## 2023-01-07 ENCOUNTER — Other Ambulatory Visit (HOSPITAL_COMMUNITY)
Admission: RE | Admit: 2023-01-07 | Discharge: 2023-01-07 | Disposition: A | Discharge status: Home / Self Care | Payer: Medicare Other | Source: Ambulatory Visit | Attending: Obstetrics | Admitting: Obstetrics

## 2023-01-07 VITALS — Ht 64.0 in | Wt 298.0 lb

## 2023-01-07 DIAGNOSIS — N898 Other specified noninflammatory disorders of vagina: Secondary | ICD-10-CM | POA: Diagnosis present

## 2023-01-07 DIAGNOSIS — K5900 Constipation, unspecified: Secondary | ICD-10-CM | POA: Insufficient documentation

## 2023-01-07 DIAGNOSIS — R82998 Other abnormal findings in urine: Secondary | ICD-10-CM | POA: Insufficient documentation

## 2023-01-07 DIAGNOSIS — N3946 Mixed incontinence: Secondary | ICD-10-CM | POA: Diagnosis present

## 2023-01-07 DIAGNOSIS — R351 Nocturia: Secondary | ICD-10-CM | POA: Insufficient documentation

## 2023-01-07 DIAGNOSIS — T8389XA Other specified complication of genitourinary prosthetic devices, implants and grafts, initial encounter: Secondary | ICD-10-CM | POA: Insufficient documentation

## 2023-01-07 DIAGNOSIS — T83719A Erosion of other prosthetic materials to surrounding organ or tissue, initial encounter: Secondary | ICD-10-CM

## 2023-01-07 DIAGNOSIS — Z8742 Personal history of other diseases of the female genital tract: Secondary | ICD-10-CM | POA: Insufficient documentation

## 2023-01-07 DIAGNOSIS — N819 Female genital prolapse, unspecified: Secondary | ICD-10-CM | POA: Insufficient documentation

## 2023-01-07 DIAGNOSIS — N952 Postmenopausal atrophic vaginitis: Secondary | ICD-10-CM | POA: Insufficient documentation

## 2023-01-07 LAB — POCT URINALYSIS DIPSTICK
Bilirubin, UA: NEGATIVE
Blood, UA: NEGATIVE
Glucose, UA: NEGATIVE
Ketones, UA: NEGATIVE
Nitrite, UA: NEGATIVE
Protein, UA: NEGATIVE
Spec Grav, UA: 1.015 (ref 1.010–1.025)
Urobilinogen, UA: NEGATIVE U/dL — AB
pH, UA: 6 (ref 5.0–8.0)

## 2023-01-07 MED ORDER — GEMTESA 75 MG PO TABS: 75.0000 mg | ORAL_TABLET | Freq: Every day | ORAL | 0 refills | Status: DC

## 2023-01-07 MED ORDER — GEMTESA 75 MG PO TABS: 75.0000 mg | ORAL_TABLET | Freq: Every day | ORAL | 2 refills | Status: DC

## 2023-01-07 MED ORDER — ESTROGENS CONJUGATED 0.625 MG/GM VA CREA: 1.0000 | TOPICAL_CREAM | VAGINAL | 0 refills | Status: DC

## 2023-01-07 MED ORDER — ESTROGENS CONJUGATED 0.625 MG/GM VA CREA: 1.0000 | TOPICAL_CREAM | VAGINAL | 2 refills | Status: DC

## 2023-01-07 NOTE — Assessment & Plan Note (Signed)
-   For constipation, we reviewed the importance of a better bowel regimen.  We also discussed the importance of avoiding chronic straining, as it can exacerbate her pelvic floor symptoms; we discussed treating constipation and straining prior to surgery, as postoperative straining can lead to damage to the repair and recurrence of symptoms. We discussed initiating therapy with increasing fluid intake, fiber supplementation, stool softeners, and laxatives such as miralax.  - encouraged to continue titration of fiber supplementation and minimize straining

## 2023-01-07 NOTE — Assessment & Plan Note (Addendum)
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 - removed pessary due to ulceration at bilateral vaginal apices  - start vaginal estrogen - encouraged splinting or pelvic wand use to ensure bladder and bowel emptying

## 2023-01-07 NOTE — Progress Notes (Signed)
New Patient Evaluation and Consultation  Referring Provider: Steva Ready, DO PCP: Collene Mares, [place] Date of Service: 01/07/2023  SUBJECTIVE Chief Complaint: New Patient (Initial Visit) Cassandra Ho is a 77 y.o. female here today for SUI)  History of Present Illness: Cassandra Ho is a 77 y.o. White or Caucasian female seen in consultation at the request of Dr Connye Burkitt for evaluation of stress urinary incontinence and pelvic organ prolapse.    Tried self directed pelvic floor exercises, vaginal estrogen Fitted for size 2 ring with support pessary for SUI since 2018, refitted with size 3 ring with support pessary on 05/2022 Now cherry tomato size vaginal bulge beyond pessary and burning sensation when she sits with worsening urinary leakage. Erythema of upper vaginal wall near cervix noted 05/02/21 and recommended 2 week break from pessary use, resolved on 05/29/21. Returned to office 06/12/21 with burning and pinching, office evaluation with spotting on ectocervix. Spotting started 10/02/21 Uses cane for mobility Pending weight loss for knee surgery, avoids pool exercises due to leakage  Review of records significant for: A. Fib, post-ablative hypothyroidism, RA  Urinary Symptoms: Leaks urine with cough/ sneeze, laughing, exercise, lifting, going from sitting to standing, with a full bladder, with urgency, and while asleep since 3rd pregnancy Leaks 8-12 time(s) with sneezing, coughing, laughing per days.  Leaks with urgency 4-5x/week with large volume leakage Pad use:  8-12  pads per day.   Patient is bothered by UI symptoms.  Day time voids 5.  Nocturia: 2 times per night to void with OSA not on CPAP since thyroidectomy and insomnia. Reports panic attacks from CPAP in the past Stops fluid intake 6:30pm Reports LE edema, uses compression socks Voiding dysfunction:  empties bladder well.  Patient does not use a catheter to empty bladder.  When urinating, patient feels  dribbling after finishing Drinks: <64oz water per day, 3 cups of decaf coffee and 1 can of decaf diet cokes/day  UTIs: 2 UTI's in the last year.   Denies history of blood in urine, kidney or bladder stones, pyelonephritis, bladder cancer, and kidney cancer No results found for the last 90 days.   Pelvic Organ Prolapse Symptoms:                  Patient Admits to a feeling of a bulge the vaginal area. It has been present for 1 years.  Patient Admits to feeling a bulge. Describes vaginal pressure sensation This bulge is bothersome.  Bowel Symptom: Bowel movements: 1 time(s) per day with history of constipation Stool consistency: soft  Straining: yes.  Splinting: no.  Incomplete evacuation: no.  Patient Denies accidental bowel leakage / fecal incontinence Bowel regimen: stopped fiber gummies and switched to metamucil Last colonoscopy: denies need to return [facility] Colonoscopy          Discontinued - Colonoscopy  Discontinued      Frequency changed to Never automatically (Topic No Longer Applies)   06/15/2014  Surgical Procedure: COLONOSCOPY WITH PROPOFOL   Only the first 1 history entries have been loaded, but more history exists.            Sexual Function Sexually active: no, unable to manage pessary Sexual orientation: Straight Pain with sex: No  Pelvic Pain Denies pelvic pain  Past Medical History:  Past Medical History:  Diagnosis Date   Allergic rhinitis    Arthritis    generalized-ankles and knees   BCC (basal cell carcinoma of skin)    Constipation  Dyslipidemia    Edema of both lower extremities    GERD (gastroesophageal reflux disease)    H/O: pituitary tumor    "irradicated" no problems now   Headache    migraines in past   High cholesterol    Hypertension    Hypothyroidism    Hypothyroidism (acquired)    Post-ablation   Joint pain    Morbid obesity (HCC)    OSA (obstructive sleep apnea)    intolerant to CPAP and now using an oral device    Osteoarthritis    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 with 31.4 mCi of I-131 on 12/05/2007   Transfusion history    with hip surgery     Past Surgical History:   Past Surgical History:  Procedure Laterality Date   ANKLE ARTHROTOMY Left    ankle and foot reconstruction ;retained hardware   BREAST BIOPSY Bilateral    Many years ago, no scar seen    BREAST SURGERY     x2 biopsies(benign)   CARPAL TUNNEL RELEASE Bilateral    COLONOSCOPY WITH PROPOFOL N/A 06/15/2014   Procedure: COLONOSCOPY WITH PROPOFOL;  Surgeon: Charolett Bumpers, MD;  Location: [facility];  Service: Endoscopy;  Laterality: N/A;   D&C  1977   DILATION AND CURETTAGE OF UTERUS  1973   SAB   DILATION AND CURETTAGE OF UTERUS  1974   DILATION AND CURETTAGE OF UTERUS  1975   DILATION AND CURETTAGE OF UTERUS  1978   DILATION AND CURETTAGE OF UTERUS  1991   HIP FRACTURE SURGERY     repaired with cader bone and metal plates   IUD REMOVAL  2000   hysteroscopy   KNEE ARTHROSCOPY Right    torn meniscus       Past OB/GYN History: OB History  Gravida Para Term Preterm AB Living  9 3 3  0 6 3  SAB IAB Ectopic Multiple Live Births  6 0 0   3    # Outcome Date GA Lbr Len/2nd Weight Sex Type Anes PTL Lv  9 SAB           8 SAB           7 SAB           6 SAB           5 SAB           4 SAB           3 Term     F Vag-Spont   LIV  2 Term     F Vag-Spont   LIV  1 Term     M Vag-Spont  Y LIV    Vaginal deliveries: 3, largest infant 9lbs Forceps/ Vacuum deliveries: 0, Cesarean section: 0 Menopausal: Yes, at age 58, Admits to vaginal bleeding since menopause. Reports pink tinge after prolonged car ride, s/p TVUS 12/26/20 retroverted uterus 7.53 x 3.75 x 5.52cm, anterior submucosal fibroid 3.8 x 4.2 x 4.6cm (increased from 07/22/18 TVUS). Thin endometrial lining 0.29cm, right fundal 4.9 x 3.1 x 4.1cm with small amount of fluid. No free fluid or adnexal masses, right ovary not  visualized. Contraception: n/a. Last pap smear was NILM 11/2014.  Any history of abnormal pap smears: no. No results found for: "DIAGPAP", "HPVHIGH", "ADEQPAP"  Medications: Patient has a current medication list which includes the following prescription(s): aspirin, calcium carbonate-vit d-min, conjugated estrogens, conjugated estrogens, famotidine, ibuprofen, levothyroxine, loratadine, losartan-hydrochlorothiazide, psyllium, rosuvastatin, trazodone, gemtesa, and  gemtesa.   Allergies: Patient has No Known Allergies.   Social History:  Social History   Tobacco Use   Smoking status: Former    Types: Cigarettes   Smokeless tobacco: Never   Tobacco comments:    Quit '78  Vaping Use   Vaping status: Never Used  Substance Use Topics   Alcohol use: Yes    Comment: ocass   Drug use: No    Relationship status: married Patient lives with her husband.   Patient is not employed. Regular exercise: No History of abuse: No  Family History:   Family History  Problem Relation Age of Onset   Thyroid cancer Mother    Lung cancer Mother    Melanoma Mother    Diabetes Mother    Thyroid disease Mother    Alcohol abuse Mother    Bladder Cancer Mother    Alcoholism Father    Cirrhosis Father    High blood pressure Father    High Cholesterol Father    Fibromyalgia Sister    Breast cancer Sister        30s   Breast cancer Maternal Grandmother        18s   Breast cancer Paternal Grandmother        73s   Breast cancer Daughter 35   Breast cancer Maternal Aunt        60s     Review of Systems: Review of Systems  Constitutional:  Negative for fever, malaise/fatigue and weight loss.  Respiratory:  Negative for cough, shortness of breath and wheezing.   Cardiovascular:  Positive for leg swelling. Negative for chest pain and palpitations.  Gastrointestinal:  Positive for constipation. Negative for abdominal pain and blood in stool.  Genitourinary:  Positive for frequency (night time).  Negative for hematuria.  Skin:  Negative for rash.  Neurological:  Negative for dizziness, weakness and headaches.  Endo/Heme/Allergies:  Does not bruise/bleed easily.       Hot flashes  Psychiatric/Behavioral:  Negative for depression. The patient is not nervous/anxious.      OBJECTIVE Physical Exam: Vitals:   01/07/23 1346  Weight: 298 lb (135.2 kg)  Height: 5\' 4"  (1.626 m)    Physical Exam Constitutional:      General: She is not in acute distress.    Appearance: Normal appearance.  Genitourinary:     Bladder and urethral meatus normal.     No lesions in the vagina.     Genitourinary Comments: Limited bimanual exam due to habitus     Right Labia: No rash, tenderness, lesions, skin changes or Bartholin's cyst.    Left Labia: No tenderness, lesions, skin changes, Bartholin's cyst or rash.    Vaginal discharge (blood tinged white discharge), erythema and ulceration present.     No vaginal tenderness, bleeding or granulation tissue.     Anterior, posterior and apical vaginal prolapse present.    Moderate vaginal atrophy present.     Right Adnexa: not tender, not full and no mass present.    Left Adnexa: not tender, not full and no mass present.    No cervical motion tenderness, discharge, friability, lesion, polyp or nabothian cyst.        Uterus is not enlarged, fixed, tender or irregular.     No uterine mass detected.    Urethral meatus caruncle not present.    Urethral stress urinary incontinence with cough stress test present.     No urethral prolapse, tenderness, mass, hypermobility or discharge present.  Bladder is not tender, urgency on palpation not present and masses not present.      Levator ani not tender, obturator internus not tender, no asymmetrical contractions present and no pelvic spasms present.    Symmetrical pelvic sensation, anal wink present and BC reflex present. Cardiovascular:     Rate and Rhythm: Normal rate.  Pulmonary:     Effort:  Pulmonary effort is normal. No respiratory distress.  Abdominal:     General: There is no distension.     Palpations: Abdomen is soft. There is no mass.     Tenderness: There is no abdominal tenderness.     Hernia: No hernia is present.  Neurological:     Mental Status: She is alert.  Vitals reviewed. Exam conducted with a chaperone present.     POP-Q:   POP-Q  -2                                            Aa   -2                                           Ba  -6                                              C   2                                            Gh  2                                            Pb  7                                            tvl   -2                                            Ap  -2                                            Bp  -6                                              D    Post-Void Residual (PVR) by Bladder Scan: In order to evaluate bladder emptying, we discussed obtaining a postvoid residual and patient agreed to this procedure.  Procedure: The ultrasound unit was placed on the patient's  abdomen in the suprapubic region after the patient had voided.    Post Void Residual - 01/07/23 1410       Post Void Residual   Post Void Residual 42 mL              Laboratory Results: Lab Results  Component Value Date   COLORU yellow 01/07/2023   CLARITYU clear 01/07/2023   GLUCOSEUR Negative 01/07/2023   BILIRUBINUR negative 01/07/2023   KETONESU negative 01/07/2023   SPECGRAV 1.015 01/07/2023   RBCUR negative 01/07/2023   PHUR 6.0 01/07/2023   PROTEINUR Negative 01/07/2023   UROBILINOGEN negative (A) 01/07/2023   LEUKOCYTESUR Small (1+) (A) 01/07/2023    Lab Results  Component Value Date   CREATININE 0.74 01/13/2021   CREATININE 0.55 (L) 06/11/2016   CREATININE 0.64 04/22/2015    Lab Results  Component Value Date   HGBA1C 5.5 06/06/2021    Lab Results  Component Value Date   HGB 14.9 01/13/2021      ASSESSMENT AND PLAN Cassandra Ho is a 77 y.o. with:  1. Urinary incontinence, mixed   2. Female genital prolapse, unspecified type   3. Vaginal erosion secondary to pessary use, initial encounter (HCC)   4. Vaginal discharge   5. History of postmenopausal bleeding   6. Nocturia   7. Constipation, unspecified constipation type     Urinary incontinence, mixed Assessment & Plan: - POCT + leuk, pending culture.  - bladder scan 42mL WNL - avoid pool exercises due to leakage, pending knee surgery after weight loss - We discussed the symptoms of overactive bladder (OAB), which include urinary urgency, urinary frequency, nocturia, with or without urge incontinence.  While we do not know the exact etiology of OAB, several treatment options exist. We discussed management including behavioral therapy (decreasing bladder irritants, urge suppression strategies, timed voids, bladder retraining), physical therapy, medication; for refractory cases posterior tibial nerve stimulation, sacral neuromodulation, and intravesical botulinum toxin injection.  For anticholinergic medications, we discussed the potential side effects of anticholinergics including dry eyes, dry mouth, constipation, cognitive impairment and urinary retention. For Beta-3 agonist medication, we discussed the potential side effect of elevated blood pressure which is more likely to occur in individuals with uncontrolled hypertension. - Rx and samples for trial of Gemtesa - For treatment of stress urinary incontinence,  non-surgical options include expectant management, weight loss, physical therapy, as well as a pessary.  Surgical options include a midurethral sling, Burch urethropexy, and transurethral injection of a bulking agent. - encouraged refitting for incontinence pessary after resolution of vaginal ulcer due to worsening urinary leakage after ring with support pessary  Orders: -     POCT urinalysis dipstick -     Gemtesa;  Take 1 tablet (75 mg total) by mouth daily.  Dispense: 28 tablet; Refill: 0 -     Gemtesa; Take 1 tablet (75 mg total) by mouth daily.  Dispense: 30 tablet; Refill: 2 -     Urine Culture; Future  Female genital prolapse, unspecified type Assessment & Plan: - size 2 ring with support pessary increased to size 3 in 05/2022 - For treatment of pelvic organ prolapse, we discussed options for management including expectant management, conservative management, and surgical management, such as Kegels, a pessary, pelvic floor physical therapy, and specific surgical procedures. - removed pessary due to ulceration at bilateral vaginal apices  - start vaginal estrogen - encouraged splinting or pelvic wand use to ensure bladder and bowel emptying   Vaginal erosion secondary to pessary  use, initial encounter [facility]) Assessment & Plan: - intermittent spotting since 2023 with vaginal erythema noted on exam - pessary removed due to ulceration with bleeding on exam - pending Nuswab to r/o infection - start vaginal estrogen - repeat exam in 4-6 weeks  Orders: -     Estrogens Conjugated; Place 1 Applicatorful vaginally 2 (two) times a week. Place 0.5g nightly for two weeks then twice a week after  Dispense: 30 g; Refill: 2 -     Estrogens Conjugated; Place 1 Applicatorful vaginally 2 (two) times a week. Place 0.5g nightly for two weeks then twice a week after  Dispense: 4 g; Refill: 0  Vaginal discharge Assessment & Plan: - likely due to vaginal ulcer and pessary use, unable to self manage - pending Nuswab  Orders: -     Cervicovaginal ancillary only  History of postmenopausal bleeding Assessment & Plan: - intermittent spotting since 2023 - TVUS 12/26/20 retroverted uterus 7.53 x 3.75 x 5.52cm, anterior submucosal fibroid 3.8 x 4.2 x 4.6cm (increased from 07/22/18 TVUS). Thin endometrial lining 0.29cm, right fundal 4.9 x 3.1 x 4.1cm with small amount of fluid. No free fluid or adnexal masses, right ovary  not visualized. - reports pink tinge vaginal discharge after prolonged car rides, pt denies additional episodes since workup (spotting noted in 2023 per chart review after vaginal erythema noted) - discussed need for additional endometrial workup if she experiences any bleeding with endometrial biopsy or hysteroscopy D&C  - start vaginal estrogen for atrophy and vaginal ulcer   Nocturia Assessment & Plan: - avoid fluid intake after 6pm - elevated feet during the day or use compression socks to reduce lower extremity swelling - reports resolution of OSA since thyroid surgery and reports CPAP use with panic attack - trial of Gemtesa    Constipation, unspecified constipation type Assessment & Plan: - For constipation, we reviewed the importance of a better bowel regimen.  We also discussed the importance of avoiding chronic straining, as it can exacerbate her pelvic floor symptoms; we discussed treating constipation and straining prior to surgery, as postoperative straining can lead to damage to the repair and recurrence of symptoms. We discussed initiating therapy with increasing fluid intake, fiber supplementation, stool softeners, and laxatives such as miralax.  - encouraged to continue titration of fiber supplementation and minimize straining   Time spent: I spent 60 minutes dedicated to the care of this patient on the date of this encounter to include pre-visit review of records, face-to-face time with the patient discussing mixed urinary incontinence, pelvic organ prolapse, nocturia, vaginal ulcer, constipation, history of postmenopausal bleeding, and post visit documentation and ordering medication/ testing.   Loleta Chance, MD

## 2023-01-07 NOTE — Patient Instructions (Signed)
You have a stage 1 (out of 4) prolapse.  We discussed the fact that it is not life threatening but there are several treatment options. For treatment of pelvic organ prolapse, we discussed options for management including expectant management, conservative management, and surgical management, such as Kegels, a pessary, pelvic floor physical therapy, and specific surgical procedures.     For vaginal atrophy (thinning of the vaginal tissue that can cause dryness and burning) and UTI prevention we discussed estrogen replacement in the form of vaginal cream.   Start vaginal estrogen therapy nightly for two weeks then 2 times weekly at night. This can be placed with your finger or an applicator inside the vagina and around the urethra.  Please let us know if the prescription is too expensive and we can look for alternative options.   Is vaginal estrogen therapy safe for me? Vaginal estrogen preparations act on the vaginal skin, and only a very tiny amount is absorbed into the bloodstream (0.01%).  They work in a similar way to hand or face cream.  There is minimal absorption and they are therefore perfectly safe. If you have had breast cancer and have persistent troublesome symptoms which aren't settling with vaginal moisturisers and lubricants, local estrogen treatment may be a possibility, but consultation with your oncologist should take place first.   We discussed the symptoms of overactive bladder (OAB), which include urinary urgency, urinary frequency, night-time urination, with or without urge incontinence.  We discussed management including behavioral therapy (decreasing bladder irritants by following a bladder diet, urge suppression strategies, timed voids, bladder retraining), physical therapy, medication; and for refractory cases posterior tibial nerve stimulation, sacral neuromodulation, and intravesical botulinum toxin injection.   For Beta-3 agonist medication, we discussed the potential side  effect of elevated blood pressure which is more likely to occur in individuals with uncontrolled hypertension. You were given samples for Gemtesa 75 mg.  It can take a month to start working so give it time, but if you have bothersome side effects call sooner and we can try a different medication.  Call us if you have trouble filling the prescription or if it's not covered by your insurance.  For treatment of stress urinary incontinence, which is leakage with physical activity/movement/strainging/coughing, we discussed expectant management versus nonsurgical options versus surgery. Nonsurgical options include weight loss, physical therapy, as well as a pessary.  Surgical options include a midurethral sling, which is a synthetic mesh sling that acts like a hammock under the urethra to prevent leakage of urine, a Burch urethropexy, and transurethral injection of a bulking agent.

## 2023-01-07 NOTE — Assessment & Plan Note (Signed)
-   likely due to vaginal ulcer and pessary use, unable to self manage - pending Nuswab

## 2023-01-07 NOTE — Assessment & Plan Note (Addendum)
-   intermittent spotting since 2023 with vaginal erythema noted on exam - pessary removed due to ulceration with bleeding on exam - pending Nuswab to r/o infection - start vaginal estrogen - repeat exam in 4-6 weeks

## 2023-01-07 NOTE — Assessment & Plan Note (Signed)
-   avoid fluid intake after 6pm - elevated feet during the day or use compression socks to reduce lower extremity swelling - reports resolution of OSA since thyroid surgery and reports CPAP use with panic attack - trial of Gemtesa

## 2023-01-07 NOTE — Assessment & Plan Note (Signed)
-   POCT + leuk, pending culture.  - bladder scan 42mL WNL - avoid pool exercises due to leakage, pending knee surgery after weight loss - We discussed the symptoms of overactive bladder (OAB), which include urinary urgency, urinary frequency, nocturia, with or without urge incontinence.  While we do not know the exact etiology of OAB, several treatment options exist. We discussed management including behavioral therapy (decreasing bladder irritants, urge suppression strategies, timed voids, bladder retraining), physical therapy, medication; for refractory cases posterior tibial nerve stimulation, sacral neuromodulation, and intravesical botulinum toxin injection.  For anticholinergic medications, we discussed the potential side effects of anticholinergics including dry eyes, dry mouth, constipation, cognitive impairment and urinary retention. For Beta-3 agonist medication, we discussed the potential side effect of elevated blood pressure which is more likely to occur in individuals with uncontrolled hypertension. - Rx and samples for trial of Gemtesa - For treatment of stress urinary incontinence,  non-surgical options include expectant management, weight loss, physical therapy, as well as a pessary.  Surgical options include a midurethral sling, Burch urethropexy, and transurethral injection of a bulking agent. - encouraged refitting for incontinence pessary after resolution of vaginal ulcer due to worsening urinary leakage after ring with support pessary

## 2023-01-07 NOTE — Assessment & Plan Note (Addendum)
-   intermittent spotting since 2023 - TVUS 12/26/20 retroverted uterus 7.53 x 3.75 x 5.52cm, anterior submucosal fibroid 3.8 x 4.2 x 4.6cm (increased from 07/22/18 TVUS). Thin endometrial lining 0.29cm, right fundal 4.9 x 3.1 x 4.1cm with small amount of fluid. No free fluid or adnexal masses, right ovary not visualized. - reports pink tinge vaginal discharge after prolonged car rides, pt denies additional episodes since workup (spotting noted in 2023 per chart review after vaginal erythema noted) - discussed need for additional endometrial workup if she experiences any bleeding with endometrial biopsy or hysteroscopy D&C  - start vaginal estrogen for atrophy and vaginal ulcer

## 2023-01-08 ENCOUNTER — Other Ambulatory Visit: Payer: Self-pay | Admitting: Obstetrics

## 2023-01-08 DIAGNOSIS — N898 Other specified noninflammatory disorders of vagina: Secondary | ICD-10-CM

## 2023-01-08 LAB — CERVICOVAGINAL ANCILLARY ONLY
Bacterial Vaginitis (gardnerella): POSITIVE — AB
Candida Glabrata: NEGATIVE
Candida Vaginitis: NEGATIVE
Comment: NEGATIVE
Comment: NEGATIVE
Comment: NEGATIVE

## 2023-01-08 MED ORDER — METRONIDAZOLE 500 MG PO TABS: 500.0000 mg | ORAL_TABLET | Freq: Two times a day (BID) | ORAL | 0 refills | Status: AC

## 2023-01-08 NOTE — Telephone Encounter (Signed)
Pt took her BP at home as we couldn't read it here. 110/82

## 2023-01-10 LAB — URINE CULTURE: Culture: >=100000 — AB

## 2023-01-10 MED ORDER — NITROFURANTOIN MONOHYD MACRO 100 MG PO CAPS: 100.0000 mg | ORAL_CAPSULE | Freq: Two times a day (BID) | ORAL | 0 refills | Status: AC

## 2023-01-10 NOTE — Addendum Note (Signed)
Addended byWyatt Haste T on: 01/10/2023 12:14 PM   Modules accepted: Orders

## 2023-02-01 ENCOUNTER — Ambulatory Visit: Payer: Medicare Other | Admitting: Obstetrics

## 2023-02-15 ENCOUNTER — Ambulatory Visit: Payer: Medicare Other | Admitting: Obstetrics

## 2023-03-08 ENCOUNTER — Emergency Department (HOSPITAL_COMMUNITY): Payer: Medicare Other

## 2023-03-08 ENCOUNTER — Ambulatory Visit: Payer: Medicare Other | Admitting: Obstetrics

## 2023-03-08 ENCOUNTER — Inpatient Hospital Stay (HOSPITAL_COMMUNITY)
Admission: EM | Admit: 2023-03-08 | Discharge: 2023-03-15 | DRG: 308 | Disposition: A | Discharge status: Home / Self Care | Payer: Medicare Other | Attending: Internal Medicine | Admitting: Internal Medicine

## 2023-03-08 ENCOUNTER — Other Ambulatory Visit: Payer: Self-pay

## 2023-03-08 DIAGNOSIS — N3946 Mixed incontinence: Secondary | ICD-10-CM | POA: Diagnosis present

## 2023-03-08 DIAGNOSIS — I420 Dilated cardiomyopathy: Secondary | ICD-10-CM | POA: Diagnosis not present

## 2023-03-08 DIAGNOSIS — R0989 Other specified symptoms and signs involving the circulatory and respiratory systems: Secondary | ICD-10-CM | POA: Diagnosis not present

## 2023-03-08 DIAGNOSIS — E66813 Obesity, class 3: Secondary | ICD-10-CM | POA: Diagnosis not present

## 2023-03-08 DIAGNOSIS — Z87891 Personal history of nicotine dependence: Secondary | ICD-10-CM | POA: Diagnosis not present

## 2023-03-08 DIAGNOSIS — E875 Hyperkalemia: Secondary | ICD-10-CM | POA: Diagnosis not present

## 2023-03-08 DIAGNOSIS — E0580 Other thyrotoxicosis without thyrotoxic crisis or storm: Secondary | ICD-10-CM | POA: Diagnosis not present

## 2023-03-08 DIAGNOSIS — Z7982 Long term (current) use of aspirin: Secondary | ICD-10-CM

## 2023-03-08 DIAGNOSIS — J309 Allergic rhinitis, unspecified: Secondary | ICD-10-CM | POA: Diagnosis present

## 2023-03-08 DIAGNOSIS — I499 Cardiac arrhythmia, unspecified: Secondary | ICD-10-CM | POA: Diagnosis not present

## 2023-03-08 DIAGNOSIS — E039 Hypothyroidism, unspecified: Secondary | ICD-10-CM | POA: Diagnosis present

## 2023-03-08 DIAGNOSIS — I5032 Chronic diastolic (congestive) heart failure: Secondary | ICD-10-CM | POA: Diagnosis not present

## 2023-03-08 DIAGNOSIS — I340 Nonrheumatic mitral (valve) insufficiency: Secondary | ICD-10-CM | POA: Diagnosis not present

## 2023-03-08 DIAGNOSIS — E876 Hypokalemia: Secondary | ICD-10-CM | POA: Diagnosis present

## 2023-03-08 DIAGNOSIS — Z79890 Hormone replacement therapy (postmenopausal): Secondary | ICD-10-CM

## 2023-03-08 DIAGNOSIS — Z1152 Encounter for screening for COVID-19: Secondary | ICD-10-CM

## 2023-03-08 DIAGNOSIS — E7800 Pure hypercholesterolemia, unspecified: Secondary | ICD-10-CM | POA: Diagnosis present

## 2023-03-08 DIAGNOSIS — Z79899 Other long term (current) drug therapy: Secondary | ICD-10-CM

## 2023-03-08 DIAGNOSIS — I480 Paroxysmal atrial fibrillation: Secondary | ICD-10-CM | POA: Diagnosis not present

## 2023-03-08 DIAGNOSIS — R0602 Shortness of breath: Secondary | ICD-10-CM | POA: Diagnosis not present

## 2023-03-08 DIAGNOSIS — I5023 Acute on chronic systolic (congestive) heart failure: Secondary | ICD-10-CM | POA: Diagnosis not present

## 2023-03-08 DIAGNOSIS — R059 Cough, unspecified: Secondary | ICD-10-CM | POA: Diagnosis not present

## 2023-03-08 DIAGNOSIS — K219 Gastro-esophageal reflux disease without esophagitis: Secondary | ICD-10-CM | POA: Diagnosis not present

## 2023-03-08 DIAGNOSIS — Z7984 Long term (current) use of oral hypoglycemic drugs: Secondary | ICD-10-CM

## 2023-03-08 DIAGNOSIS — G4733 Obstructive sleep apnea (adult) (pediatric): Secondary | ICD-10-CM | POA: Diagnosis not present

## 2023-03-08 DIAGNOSIS — Z6841 Body Mass Index (BMI) 40.0 and over, adult: Secondary | ICD-10-CM

## 2023-03-08 DIAGNOSIS — R6889 Other general symptoms and signs: Secondary | ICD-10-CM | POA: Diagnosis not present

## 2023-03-08 DIAGNOSIS — Z833 Family history of diabetes mellitus: Secondary | ICD-10-CM

## 2023-03-08 DIAGNOSIS — I5021 Acute systolic (congestive) heart failure: Secondary | ICD-10-CM | POA: Diagnosis not present

## 2023-03-08 DIAGNOSIS — I509 Heart failure, unspecified: Secondary | ICD-10-CM | POA: Diagnosis not present

## 2023-03-08 DIAGNOSIS — I517 Cardiomegaly: Secondary | ICD-10-CM | POA: Diagnosis not present

## 2023-03-08 DIAGNOSIS — Z7901 Long term (current) use of anticoagulants: Secondary | ICD-10-CM | POA: Diagnosis not present

## 2023-03-08 DIAGNOSIS — I5022 Chronic systolic (congestive) heart failure: Secondary | ICD-10-CM | POA: Diagnosis not present

## 2023-03-08 DIAGNOSIS — E785 Hyperlipidemia, unspecified: Secondary | ICD-10-CM | POA: Diagnosis present

## 2023-03-08 DIAGNOSIS — J069 Acute upper respiratory infection, unspecified: Secondary | ICD-10-CM | POA: Diagnosis not present

## 2023-03-08 DIAGNOSIS — I10 Essential (primary) hypertension: Secondary | ICD-10-CM | POA: Diagnosis present

## 2023-03-08 DIAGNOSIS — I110 Hypertensive heart disease with heart failure: Secondary | ICD-10-CM | POA: Diagnosis present

## 2023-03-08 DIAGNOSIS — I429 Cardiomyopathy, unspecified: Secondary | ICD-10-CM | POA: Diagnosis present

## 2023-03-08 DIAGNOSIS — Z743 Need for continuous supervision: Secondary | ICD-10-CM | POA: Diagnosis not present

## 2023-03-08 DIAGNOSIS — E871 Hypo-osmolality and hyponatremia: Secondary | ICD-10-CM | POA: Diagnosis not present

## 2023-03-08 DIAGNOSIS — I4891 Unspecified atrial fibrillation: Principal | ICD-10-CM | POA: Diagnosis present

## 2023-03-08 DIAGNOSIS — I081 Rheumatic disorders of both mitral and tricuspid valves: Secondary | ICD-10-CM | POA: Diagnosis not present

## 2023-03-08 DIAGNOSIS — Z85828 Personal history of other malignant neoplasm of skin: Secondary | ICD-10-CM

## 2023-03-08 DIAGNOSIS — I361 Nonrheumatic tricuspid (valve) insufficiency: Secondary | ICD-10-CM | POA: Diagnosis not present

## 2023-03-08 DIAGNOSIS — J9601 Acute respiratory failure with hypoxia: Secondary | ICD-10-CM | POA: Diagnosis not present

## 2023-03-08 DIAGNOSIS — Z83438 Family history of other disorder of lipoprotein metabolism and other lipidemia: Secondary | ICD-10-CM

## 2023-03-08 LAB — COMPREHENSIVE METABOLIC PANEL
ALT: 11 U/L (ref 0–44)
AST: 17 U/L (ref 15–41)
Albumin: 3.4 g/dL — ABNORMAL LOW (ref 3.5–5.0)
Alkaline Phosphatase: 59 U/L (ref 38–126)
Anion gap: 12 (ref 5–15)
BUN: 24 mg/dL — ABNORMAL HIGH (ref 8–23)
CO2: 23 mmol/L (ref 22–32)
Calcium: 8.9 mg/dL (ref 8.9–10.3)
Chloride: 107 mmol/L (ref 98–111)
Creatinine, Ser: 0.74 mg/dL (ref 0.44–1.00)
GFR, Estimated: >60 mL/min (ref >60)
Glucose, Bld: 107 mg/dL — ABNORMAL HIGH (ref 70–99)
Potassium: 3.9 mmol/L (ref 3.5–5.1)
Sodium: 142 mmol/L (ref 135–145)
Total Bilirubin: 0.6 mg/dL (ref 0.0–1.2)
Total Protein: 6.3 g/dL — ABNORMAL LOW (ref 6.5–8.1)

## 2023-03-08 LAB — CBC WITH DIFFERENTIAL/PLATELET
Abs Immature Granulocytes: 0.03 10*3/uL (ref 0.00–0.07)
Basophils Absolute: 0.1 10*3/uL (ref 0.0–0.1)
Basophils Relative: 1 %
Eosinophils Absolute: 0.1 10*3/uL (ref 0.0–0.5)
Eosinophils Relative: 1 %
HCT: 42.6 % (ref 36.0–46.0)
Hemoglobin: 13.7 g/dL (ref 12.0–15.0)
Immature Granulocytes: 0 %
Lymphocytes Relative: 21 %
Lymphs Abs: 1.8 10*3/uL (ref 0.7–4.0)
MCH: 30.4 pg (ref 26.0–34.0)
MCHC: 32.2 g/dL (ref 30.0–36.0)
MCV: 94.7 fL (ref 80.0–100.0)
Monocytes Absolute: 0.9 10*3/uL (ref 0.1–1.0)
Monocytes Relative: 10 %
Neutro Abs: 5.9 10*3/uL (ref 1.7–7.7)
Neutrophils Relative %: 67 %
Platelets: 224 10*3/uL (ref 150–400)
RBC: 4.5 MIL/uL (ref 3.87–5.11)
RDW: 14.6 % (ref 11.5–15.5)
WBC: 8.8 10*3/uL (ref 4.0–10.5)
nRBC: 0 % (ref 0.0–0.2)

## 2023-03-08 LAB — RESP PANEL BY RT-PCR (RSV, FLU A&B, COVID)  RVPGX2
Influenza A by PCR: NEGATIVE
Influenza B by PCR: NEGATIVE
Resp Syncytial Virus by PCR: NEGATIVE
SARS Coronavirus 2 by RT PCR: NEGATIVE

## 2023-03-08 LAB — MAGNESIUM: Magnesium: 1.9 mg/dL (ref 1.7–2.4)

## 2023-03-08 LAB — TSH: TSH: 2.296 u[IU]/mL (ref 0.350–4.500)

## 2023-03-08 LAB — T4, FREE: Free T4: 1.24 ng/dL — ABNORMAL HIGH (ref 0.61–1.12)

## 2023-03-08 MED ORDER — HYDROCHLOROTHIAZIDE 25 MG PO TABS
25.0000 mg | ORAL_TABLET | Freq: Every day | ORAL | Status: DC
  Administered 2023-03-09 – 2023-03-10 (×2): 25 mg via ORAL
  Filled 2023-03-08 (×2): qty 1

## 2023-03-08 MED ORDER — LORATADINE 10 MG PO TABS: 10.0000 mg | ORAL_TABLET | Freq: Every day | ORAL | Status: DC | PRN

## 2023-03-08 MED ORDER — DILTIAZEM LOAD VIA INFUSION
10.0000 mg | Freq: Once | INTRAVENOUS | Status: AC
  Administered 2023-03-08: 10 mg via INTRAVENOUS
  Filled 2023-03-08: qty 10

## 2023-03-08 MED ORDER — MIRABEGRON ER 25 MG PO TB24
25.0000 mg | ORAL_TABLET | Freq: Every day | ORAL | Status: DC
  Administered 2023-03-09 – 2023-03-15 (×7): 25 mg via ORAL
  Filled 2023-03-08 (×8): qty 1

## 2023-03-08 MED ORDER — ROSUVASTATIN CALCIUM 20 MG PO TABS
20.0000 mg | ORAL_TABLET | Freq: Every day | ORAL | Status: DC
Start: 2023-03-09 — End: 2023-03-15
  Administered 2023-03-09 – 2023-03-15 (×7): 20 mg via ORAL
  Filled 2023-03-08 (×8): qty 1

## 2023-03-08 MED ORDER — ACETAMINOPHEN 325 MG PO TABS
650.0000 mg | ORAL_TABLET | ORAL | Status: DC | PRN
  Administered 2023-03-09 – 2023-03-10 (×2): 650 mg via ORAL
  Filled 2023-03-08 (×2): qty 2

## 2023-03-08 MED ORDER — ONDANSETRON HCL 4 MG/2ML IJ SOLN
4.0000 mg | Freq: Four times a day (QID) | INTRAMUSCULAR | Status: DC | PRN
  Administered 2023-03-14: 4 mg via INTRAVENOUS

## 2023-03-08 MED ORDER — APIXABAN 5 MG PO TABS
5.0000 mg | ORAL_TABLET | Freq: Two times a day (BID) | ORAL | Status: DC
  Administered 2023-03-08 – 2023-03-15 (×14): 5 mg via ORAL
  Filled 2023-03-08 (×15): qty 1

## 2023-03-08 MED ORDER — FAMOTIDINE 20 MG PO TABS: 20.0000 mg | ORAL_TABLET | Freq: Every day | ORAL | Status: DC | PRN

## 2023-03-08 MED ORDER — LOSARTAN POTASSIUM-HCTZ 100-25 MG PO TABS: 1.0000 | ORAL_TABLET | Freq: Every day | ORAL | Status: DC

## 2023-03-08 MED ORDER — LEVOTHYROXINE SODIUM 100 MCG PO TABS
125.0000 ug | ORAL_TABLET | Freq: Every day | ORAL | Status: DC
  Administered 2023-03-09 – 2023-03-15 (×7): 125 ug via ORAL
  Filled 2023-03-08 (×8): qty 1

## 2023-03-08 MED ORDER — DILTIAZEM HCL-DEXTROSE 125-5 MG/125ML-% IV SOLN (PREMIX)
5.0000 mg/h | INTRAVENOUS | Status: DC
  Administered 2023-03-08: 5 mg/h via INTRAVENOUS
  Administered 2023-03-09: 10 mg/h via INTRAVENOUS
  Filled 2023-03-08 (×2): qty 125

## 2023-03-08 MED ORDER — ASPIRIN 81 MG PO TABS: 81.0000 mg | ORAL_TABLET | Freq: Every day | ORAL | Status: DC

## 2023-03-08 MED ORDER — LOSARTAN POTASSIUM 50 MG PO TABS
100.0000 mg | ORAL_TABLET | Freq: Every day | ORAL | Status: DC
  Administered 2023-03-09 – 2023-03-10 (×2): 100 mg via ORAL
  Filled 2023-03-08 (×2): qty 2

## 2023-03-08 NOTE — ED Provider Notes (Signed)
[place] [facility]ENC[facility] DEPARTMENT AT [facility] Provider Note   CSN: [id number] Arrival date & time: 03/08/23  1636     History  Chief Complaint  Patient presents with   Atrial Fibrillation    Cassandra Ho is a 78 y.o. female.   Atrial Fibrillation  Patient has had URI symptoms for almost a month now.  States feeling somewhat better went to urgent care today to make sure she was improving.  States they checked her lungs and they were clear but found to have a heart rate of 100 3250 and found to be in A-fib.  Patient has questionable history of A-fib.  States that she previously was at the dentist and went to an urgent care and was in A-fib although by time she got the hospital was not in it.  Not treated.  No swelling her legs.  Has been coughing with some mild sputum production.  States has a history of thyroid nodule with ablation.  States when her thyroid goes off she feels her heart rate beating more.  States she has been feeling bad for 4 days.    Past Medical History:  Diagnosis Date   Allergic rhinitis    Arthritis    generalized-ankles and knees   BCC (basal cell carcinoma of skin)    Constipation    Dyslipidemia    Edema of both lower extremities    GERD (gastroesophageal reflux disease)    H/O: pituitary tumor    "irradicated" no problems now   Headache    migraines in past   High cholesterol    Hypertension    Hypothyroidism    Hypothyroidism (acquired)    Post-ablation   Joint pain    Morbid obesity (HCC)    OSA (obstructive sleep apnea)    intolerant to CPAP and now using an oral device   Osteoarthritis    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 with 31.4 mCi of I-131 on 12/05/2007   Transfusion history    with hip surgery    Home Medications Prior to Admission medications   Medication Sig Start Date End Date Taking? Authorizing Provider  aspirin 81 MG tablet Take 81 mg by mouth daily.    [provider]  Calcium Carbonate-Vit D-Min (CALCIUM 1200 PO) Take 1 tablet by mouth daily.    [provider]  conjugated estrogens (PREMARIN) vaginal cream Place 1 Applicatorful vaginally 2 (two) times a week. Place 0.5g nightly for two weeks then twice a week after 01/07/23   Wyatt Haste T, MD  conjugated estrogens (PREMARIN) vaginal cream Place 1 Applicatorful vaginally 2 (two) times a week. Place 0.5g nightly for two weeks then twice a week after 01/07/23   Wyatt Haste T, MD  famotidine (PEPCID) 20 MG tablet Take 20 mg by mouth daily as needed for heartburn or indigestion.    [provider]  ibuprofen (ADVIL) 200 MG tablet Take 800 mg by mouth 2 (two) times daily as needed for mild pain.    [provider]  levothyroxine (SYNTHROID, LEVOTHROID) 150 MCG tablet Take 150 mcg by mouth daily before breakfast.    [provider]  loratadine (CLARITIN) 10 MG tablet Take 10 mg by mouth daily as needed for allergies.    [provider]  losartan-hydrochlorothiazide (HYZAAR) 100-25 MG tablet Take 1 tablet by mouth daily. 11/27/21   Corinna Capra A, DO  psyllium (HYDROCIL/METAMUCIL) 95 % PACK Take 1 packet by mouth daily.  [provider]  rosuvastatin (CRESTOR) 20 MG tablet Take 1 tablet (20 mg total) by mouth daily. Please make overdue appt with Dr. Mayford Knife before anymore refills. 2nd attempt 01/12/19   Quintella Reichert, MD  traZODone (DESYREL) 50 MG tablet Take 50 mg by mouth at bedtime as needed for sleep. 05/03/21   [provider]  Vibegron (GEMTESA) 75 MG TABS Take 1 tablet (75 mg total) by mouth daily. 01/07/23   Loleta Chance, MD  Vibegron (GEMTESA) 75 MG TABS Take 1 tablet (75 mg total) by mouth daily. 01/07/23   Loleta Chance, MD      Allergies    Patient has no known allergies.    Review of Systems   Review of Systems  Physical Exam Updated Vital Signs BP (!) 154/141   Pulse 69   Temp 99.3 F (37.4 C) (Oral)   Resp 20   Ht 5\' 6"  (1.676 m)    Wt 124.7 kg   SpO2 91%   BMI 44.39 kg/m  Physical Exam Vitals and nursing note reviewed.  Eyes:     Pupils: Pupils are equal, round, and reactive to light.  Cardiovascular:     Rate and Rhythm: Tachycardia present. Rhythm irregular.  Chest:     Chest wall: No tenderness.  Abdominal:     Tenderness: There is no abdominal tenderness.  Musculoskeletal:     Right lower leg: Edema present.     Left lower leg: Edema present.  Skin:    Capillary Refill: Capillary refill takes less than 2 seconds.  Neurological:     Mental Status: She is alert and oriented to person, place, and time.     ED Results / Procedures / Treatments   Labs (all labs ordered are listed, but only abnormal results are displayed) Labs Reviewed  COMPREHENSIVE METABOLIC PANEL - Abnormal; Notable for the following components:      Result Value   Glucose, Bld 107 (*)    BUN 24 (*)    Total Protein 6.3 (*)    Albumin 3.4 (*)    All other components within normal limits  T4, FREE - Abnormal; Notable for the following components:   Free T4 1.24 (*)    All other components within normal limits  RESP PANEL BY RT-PCR (RSV, FLU A&B, COVID)  RVPGX2  CBC WITH DIFFERENTIAL/PLATELET  MAGNESIUM  TSH  T3, FREE    EKG None  Radiology DG Chest Portable 1 View Result Date: 03/08/2023 CLINICAL DATA:  Cough, shortness of breath EXAM: PORTABLE CHEST 1 VIEW COMPARISON:  01/13/2021 FINDINGS: Cardiomegaly, vascular congestion. Diffuse interstitial prominence throughout the lungs. Bilateral lower lobe airspace opacities, favor edema. No definite effusions. No pneumothorax. No acute bony abnormality. IMPRESSION: Cardiomegaly, mild pulmonary edema. Electronically Signed   By: Charlett Nose M.D.   On: 03/08/2023 18:04    Procedures Procedures    Medications Ordered in ED Medications - No data to display  ED Course/ Medical Decision Making/ A&P         CHA2DS2-VASc Score: 4                        Medical Decision  Making Amount and/or Complexity of Data Reviewed Labs: ordered. Radiology: ordered.   Patient is new onset A-fib with RVR.  Does have a history of thyroid disease.  Potentially has had A-fib previously but not definitely diagnosed.  This episode is also ongoing for around 4 days so not a candidate  for ED cardioversion.  Does have an RVR.  Will get x-ray.  Will get basic blood work including thyroid functions.  Will require admission to the hospital due to the RVR.  X-ray does show potential volume overloaded.  Lab work overall reassuring.  Continue tachycardia.  Will start Cardizem drip.  Admit to hospitalist.   CRITICAL CARE Performed by: Benjiman Core Total critical care time: 30 minutes Critical care time was exclusive of separately billable procedures and treating other patients. Critical care was necessary to treat or prevent imminent or life-threatening deterioration. Critical care was time spent personally by me on the following activities: development of treatment plan with patient and/or surrogate as well as nursing, discussions with consultants, evaluation of patient's response to treatment, examination of patient, obtaining history from patient or surrogate, ordering and performing treatments and interventions, ordering and review of laboratory studies, ordering and review of radiographic studies, pulse oximetry and re-evaluation of patient's condition.           Final Clinical Impression(s) / ED Diagnoses Final diagnoses:  Atrial fibrillation with RVR [facility])    Rx / DC Orders ED Discharge Orders     None         Benjiman Core, MD 03/08/23 1914

## 2023-03-08 NOTE — Assessment & Plan Note (Addendum)
Echocardiogram with reduced LV systolic function to 30%, with global hypokinesis with normal RV.   Patient had IV furosemide for diuresis, negative fluid balance was achieved, with improvement in her symptoms.   Continue heart failure management with empagliflozin and Entresto   Metoprolol succinate 25 mg daily.  Diuresis with furosemide 40 mg po bid.

## 2023-03-08 NOTE — ED Triage Notes (Signed)
Pt came from urgent care to resolve a URI that was previously dx d/t becoming increasingly SOB. EKG was taken @ urgent care where she was in A fib RVR ranging 130-150s. EMS placed 20 G in L AC and administered of NS. Pt is A&O x4, ambulates w/ SOB

## 2023-03-08 NOTE — H&P (Signed)
History and Physical    Patient: Cassandra Ho ZOX:[id number] DOB: 1946/01/19 DOA: 03/08/2023 DOS: the patient was seen and examined on 03/08/2023 PCP: Collene Mares, PA  Patient coming from: Home  Chief Complaint:  Chief Complaint  Patient presents with   Atrial Fibrillation   HPI: Cassandra Ho is a 78 y.o. female with medical history significant of HTN, HLD, hypothyroidism.  Pt with one prior documented episode of a.fib at an UC in 2022, though not seen on an ED EKG at that time.  Pt on no current treatment for A.fib (no rate, rhythm, nor AC meds).  Pt with no h/o GIB or major bleeds.  Pt in to ED today after being sent in by UC.  Pt with URI symptoms (cough) for ~ 1 month now, went to UC to make sure she was improving.  Found pt to have HR up to 150s at [facility] today and pt in A.Fib so sent in to ED.  No peripheral edema.  Cough with mild sputum production.  Palpitations for ~ past 4 days.    Review of Systems: As mentioned in the history of present illness. All other systems reviewed and are negative. Past Medical History:  Diagnosis Date   Allergic rhinitis    Arthritis    generalized-ankles and knees   BCC (basal cell carcinoma of skin)    Constipation    Dyslipidemia    Edema of both lower extremities    GERD (gastroesophageal reflux disease)    H/O: pituitary tumor    "irradicated" no problems now   Headache    migraines in past   High cholesterol    Hypertension    Hypothyroidism    Hypothyroidism (acquired)    Post-ablation   Joint pain    Morbid obesity (HCC)    OSA (obstructive sleep apnea)    intolerant to CPAP and now using an oral device   Osteoarthritis    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 with 31.4 mCi of I-131 on 12/05/2007   Transfusion history    with hip surgery   Past Surgical History:  Procedure Laterality Date   ANKLE ARTHROTOMY Left    ankle and foot reconstruction ;retained hardware   BREAST  BIOPSY Bilateral    Many years ago, no scar seen    BREAST SURGERY     x2 biopsies(benign)   CARPAL TUNNEL RELEASE Bilateral    COLONOSCOPY WITH PROPOFOL N/A 06/15/2014   Procedure: COLONOSCOPY WITH PROPOFOL;  Surgeon: Charolett Bumpers, MD;  Location: [facility] ENDOSCOPY;  Service: Endoscopy;  Laterality: N/A;   D&C  1977   DILATION AND CURETTAGE OF UTERUS  1973   SAB   DILATION AND CURETTAGE OF UTERUS  1974   DILATION AND CURETTAGE OF UTERUS  1975   DILATION AND CURETTAGE OF UTERUS  1978   DILATION AND CURETTAGE OF UTERUS  1991   HIP FRACTURE SURGERY     repaired with cader bone and metal plates   IUD REMOVAL  2000   hysteroscopy   KNEE ARTHROSCOPY Right    torn meniscus     Social History:  reports that she has quit smoking. Her smoking use included cigarettes. She has never used smokeless tobacco. She reports current alcohol use. She reports that she does not use drugs.  No Known Allergies  Family History  Problem Relation Age of Onset   Thyroid cancer Mother    Lung cancer Mother    Melanoma Mother  Diabetes Mother    Thyroid disease Mother    Alcohol abuse Mother    Bladder Cancer Mother    Alcoholism Father    Cirrhosis Father    High blood pressure Father    High Cholesterol Father    Fibromyalgia Sister    Breast cancer Sister        30s   Breast cancer Maternal Grandmother        56s   Breast cancer Paternal Grandmother        12s   Breast cancer Daughter 62   Breast cancer Maternal Aunt        71s    Prior to Admission medications   Medication Sig Start Date End Date Taking? Authorizing Provider  aspirin 81 MG tablet Take 81 mg by mouth daily.   Yes [provider]  Calcium Carbonate-Vit D-Min (CALCIUM 1200 PO) Take 1 tablet by mouth daily.   Yes [provider]  conjugated estrogens (PREMARIN) vaginal cream Place 1 Applicatorful vaginally 2 (two) times a week. Place 0.5g nightly for two weeks then twice a week after 01/07/23  Yes Wyatt Haste  T, MD  famotidine (PEPCID) 20 MG tablet Take 20 mg by mouth daily as needed for heartburn or indigestion.   Yes [provider]  ibuprofen (ADVIL) 200 MG tablet Take 800 mg by mouth every 8 (eight) hours as needed for mild pain (pain score 1-3) or moderate pain (pain score 4-6).   Yes [provider]  levothyroxine (SYNTHROID, LEVOTHROID) 150 MCG tablet Take 150 mcg by mouth daily before breakfast.   Yes [provider]  loratadine (CLARITIN) 10 MG tablet Take 10 mg by mouth daily as needed for allergies.   Yes [provider]  losartan-hydrochlorothiazide (HYZAAR) 100-25 MG tablet Take 1 tablet by mouth daily. 11/27/21  Yes Corinna Capra A, DO  psyllium (HYDROCIL/METAMUCIL) 95 % PACK Take 1 packet by mouth daily.   Yes [provider]  rosuvastatin (CRESTOR) 20 MG tablet Take 1 tablet (20 mg total) by mouth daily. Please make overdue appt with Dr. Mayford Knife before anymore refills. 2nd attempt 01/12/19  Yes Turner, Cornelious Bryant, MD  Vibegron (GEMTESA) 75 MG TABS Take 1 tablet (75 mg total) by mouth daily. 01/07/23  Yes Loleta Chance, MD    Physical Exam: Vitals:   03/08/23 1730 03/08/23 1745 03/08/23 1800 03/08/23 2129  BP: (!) 147/95 (!) 154/141    Pulse: (!) 119 77 69   Resp: 16 18 20    Temp:    99 F (37.2 C)  TempSrc:    Oral  SpO2: 90% 93% 91%   Weight:      Height:       Constitutional: NAD, calm, comfortable Respiratory: clear to auscultation bilaterally, no wheezing, no crackles. Normal respiratory effort. No accessory muscle use.  Cardiovascular: IRR, IRR Abdomen: no tenderness, no masses palpated. No hepatosplenomegaly. Bowel sounds positive.  Neurologic: CN 2-12 grossly intact. Sensation intact, DTR normal. Strength 5/5 in all 4.  Psychiatric: Normal judgment and insight. Alert and oriented x 3. Normal mood.   Data Reviewed:     Labs on Admission: I have personally reviewed following labs and imaging studies  CBC: Recent Labs  Lab  03/08/23 1655  WBC 8.8  NEUTROABS 5.9  HGB 13.7  HCT 42.6  MCV 94.7  PLT 224   Basic Metabolic Panel: Recent Labs  Lab 03/08/23 1655  NA 142  K 3.9  CL 107  CO2 23  GLUCOSE  107*  BUN 24*  CREATININE 0.74  CALCIUM 8.9  MG 1.9   GFR: Estimated Creatinine Clearance: 80.8 mL/min (by C-G formula based on SCr of 0.74 mg/dL). Liver Function Tests: Recent Labs  Lab 03/08/23 1655  AST 17  ALT 11  ALKPHOS 59  BILITOT 0.6  PROT 6.3*  ALBUMIN 3.4*   No results for input(s): "LIPASE", "AMYLASE" in the last 168 hours. No results for input(s): "AMMONIA" in the last 168 hours. Coagulation Profile: No results for input(s): "INR", "PROTIME" in the last 168 hours. Cardiac Enzymes: No results for input(s): "CKTOTAL", "CKMB", "CKMBINDEX", "TROPONINI" in the last 168 hours. BNP (last 3 results) No results for input(s): "PROBNP" in the last 8760 hours. HbA1C: No results for input(s): "HGBA1C" in the last 72 hours. CBG: No results for input(s): "GLUCAP" in the last 168 hours. Lipid Profile: No results for input(s): "CHOL", "HDL", "LDLCALC", "TRIG", "CHOLHDL", "LDLDIRECT" in the last 72 hours. Thyroid Function Tests: Recent Labs    03/08/23 1655  TSH 2.296  FREET4 1.24*   Anemia Panel: No results for input(s): "VITAMINB12", "FOLATE", "FERRITIN", "TIBC", "IRON", "RETICCTPCT" in the last 72 hours. Urine analysis:    Component Value Date/Time   BILIRUBINUR negative 01/07/2023 1411   PROTEINUR Negative 01/07/2023 1411   UROBILINOGEN negative (A) 01/07/2023 1411   NITRITE negative 01/07/2023 1411   LEUKOCYTESUR Small (1+) (A) 01/07/2023 1411    Radiological Exams on Admission: DG Chest Portable 1 View Result Date: 03/08/2023 CLINICAL DATA:  Cough, shortness of breath EXAM: PORTABLE CHEST 1 VIEW COMPARISON:  01/13/2021 FINDINGS: Cardiomegaly, vascular congestion. Diffuse interstitial prominence throughout the lungs. Bilateral lower lobe airspace opacities, favor edema. No  definite effusions. No pneumothorax. No acute bony abnormality. IMPRESSION: Cardiomegaly, mild pulmonary edema. Electronically Signed   By: Charlett Nose M.D.   On: 03/08/2023 18:04    EKG: Independently reviewed.   Assessment and Plan: * Atrial fibrillation with RVR (HCC) Likely PAF for some time (note the UC visit in 2022) but new onset sustained a.fib RVR. A.fib pathway 2d echo Cardizem bolus and gtt for rate control Tele monitor CHADS-VASc = 4 (not including what appears to be new onset CHF, 5 if we include the CHF) Start eliquis Stop ASA 81 Caution / limit / ideally stop all NSAID use due to risk of bleeding stomach ulcers (Ibuprofen 800mg  TID chronically) First line alternative for her arthritis would be tylenol Beyond that, may need to refer back to ortho  New onset of congestive heart failure (HCC) Likely related to a.fib RVR. Rate control of A.Fib Lasix if BP permits and still symptomatic after a.fib rate controlled. Tele monitor 2d echo  Iatrogenic hyperthyroidism Pt slightly HYPERthyroid today with T4 of 1.24.  Presumably due to her home synthroid dose of being slightly too much for her. Reducing synthroid dose to 125 mcg daily. Repeat TSH / T4 in 4-6 weeks.  Essential hypertension, benign Cont hydrochlorothiazide and Losartan.  Reduce doses of these meds if needed to make room for rate control meds for a.fib (CCB / BB).      Advance Care Planning:   Code Status: Full Code  Consults: None  Family Communication: Husband at bedside  Severity of Illness: The appropriate patient status for this patient is OBSERVATION. Observation status is judged to be reasonable and necessary in order to provide the required intensity of service to ensure the patient's safety. The patient's presenting symptoms, physical exam findings, and initial radiographic and laboratory data in the context of their medical  condition is felt to place them at decreased risk for further  clinical deterioration. Furthermore, it is anticipated that the patient will be medically stable for discharge from the hospital within 2 midnights of admission.   Author: Hillary Bow., DO 03/08/2023 9:54 PM  For on call review [email or web address].

## 2023-03-08 NOTE — Assessment & Plan Note (Addendum)
Mild elevation in free T4, in the setting of atrial fibrillation with RVR, dose of levothyroxine has been reduced.  Follow thyroid function test as outpatient in 2 to 3 weeks.

## 2023-03-08 NOTE — Assessment & Plan Note (Addendum)
Sp direct current cardioversion, converting to sinus rhythm with good toleration.   Plan to continue AV blockade with metoprolol succinate 25 mg  Anticoagulation with apixaban. Cha2ds2 Vasc core is 5

## 2023-03-08 NOTE — Assessment & Plan Note (Signed)
Continue blood pressure control with losartan and metoprolol succinate.

## 2023-03-09 DIAGNOSIS — I4891 Unspecified atrial fibrillation: Secondary | ICD-10-CM | POA: Diagnosis not present

## 2023-03-09 DIAGNOSIS — E876 Hypokalemia: Secondary | ICD-10-CM | POA: Diagnosis present

## 2023-03-09 LAB — BASIC METABOLIC PANEL
Anion gap: 10 (ref 5–15)
BUN: 22 mg/dL (ref 8–23)
CO2: 26 mmol/L (ref 22–32)
Calcium: 8.9 mg/dL (ref 8.9–10.3)
Chloride: 104 mmol/L (ref 98–111)
Creatinine, Ser: 0.61 mg/dL (ref 0.44–1.00)
GFR, Estimated: >60 mL/min (ref >60)
Glucose, Bld: 100 mg/dL — ABNORMAL HIGH (ref 70–99)
Potassium: 3.3 mmol/L — ABNORMAL LOW (ref 3.5–5.1)
Sodium: 140 mmol/L (ref 135–145)

## 2023-03-09 LAB — CBC
HCT: 39.6 % (ref 36.0–46.0)
Hemoglobin: 12.7 g/dL (ref 12.0–15.0)
MCH: 30.2 pg (ref 26.0–34.0)
MCHC: 32.1 g/dL (ref 30.0–36.0)
MCV: 94.3 fL (ref 80.0–100.0)
Platelets: 185 10*3/uL (ref 150–400)
RBC: 4.2 MIL/uL (ref 3.87–5.11)
RDW: 15 % (ref 11.5–15.5)
WBC: 6.6 10*3/uL (ref 4.0–10.5)
nRBC: 0 % (ref 0.0–0.2)

## 2023-03-09 MED ORDER — DILTIAZEM HCL ER COATED BEADS 120 MG PO CP24
240.0000 mg | ORAL_CAPSULE | Freq: Every day | ORAL | Status: DC
  Administered 2023-03-09 – 2023-03-10 (×2): 240 mg via ORAL
  Filled 2023-03-09: qty 1
  Filled 2023-03-09: qty 2

## 2023-03-09 MED ORDER — POTASSIUM CHLORIDE CRYS ER 20 MEQ PO TBCR
20.0000 meq | EXTENDED_RELEASE_TABLET | Freq: Two times a day (BID) | ORAL | Status: AC
  Administered 2023-03-09 (×2): 20 meq via ORAL
  Filled 2023-03-09 (×2): qty 1

## 2023-03-09 NOTE — Care Management Obs Status (Signed)
MEDICARE OBSERVATION STATUS NOTIFICATION   Patient Details  Name: Cassandra Ho MRN: [id number] Date of Birth: 05/23/1945   Medicare Observation Status Notification Given:  Yes I Gilda Crease RNCM spoke with patient at the bedside to explain the [facility] letter. The receiver verbalized understanding and is in agreement to signature; a copy will be presented to the patient   Lockie Pares, RN 03/09/2023, 11:14 AM

## 2023-03-09 NOTE — Plan of Care (Signed)

## 2023-03-09 NOTE — Progress Notes (Signed)
PROGRESS NOTE    Cassandra Ho  UEA:[id number] DOB: May 29, 1945 DOA: 03/08/2023 PCP: Collene Mares, PA     Brief Narrative:  78 year old female history of hypertension, hyperlipidemia, and hypothyroidism presenting with resolving URI symptoms for the past 2 weeks.  She went to urgent care and heart rate was in the 150s and found in atrial fibrillation.  Remote history of 1 documented episode back in 2022 but not on any medication.  No swelling.  She has had fluttering in her chest for the past 4 days prior to admission.   New events last 24 hours / Subjective: Reports feeling much better since starting her Cardizem infusion.  Reports she is coughing but that is improved as well.  No chest pain.  Has not had palpitations since treatment started upon arrival.  Assessment & Plan:   Principal Problem:   Atrial fibrillation with RVR (HCC)  She converted well, transition from infusion to Cardizem p.o. 240 mg daily.  Telemetry  Eliquis 5 mg twice daily, stop aspirin and other antiplatelets  Could be related to resolving upper respiratory infection she has been dealing with  Active Problems:   Essential hypertension, benign  Continue hydrochlorothiazide 25 mg daily and losartan 100 mg daily  BPs elevated even with drip    New onset of congestive heart failure (HCC)  Await echo  Will hold off on Lasix for now  Telemetry  Likely secondary to above    Iatrogenic hyperthyroidism  Levothyroxine dosage reduced to 125 micrograms daily, recheck outpatient   Hypokalemia  Replaced today, monitor tomorrow   Mixed incontinence  Continue Myrbetriq 25 mg daily   Pure hypercholesterolemia  Continue Crestor 20 mg daily  DVT prophylaxis: Eliquis as above Code Status: Full Family Communication: Spouse bedside Coming From: Home Disposition Plan: Home Barriers to Discharge: Medical workup  Consultants:  None  Procedures:  None  Objective: Vitals:   03/09/23 1100 03/09/23  1131 03/09/23 1137 03/09/23 1200  BP:   119/83 (!) 118/90  Pulse: 91  98 89  Resp: 15  (!) 21 (!) 22  Temp:  98.3 F (36.8 C)    TempSrc:  Oral    SpO2: 93%  94% 99%  Weight:      Height:       No intake or output data in the 24 hours ending 03/09/23 1243 Filed Weights   03/08/23 1644  Weight: 124.7 kg    Examination:  General exam: Appears calm and comfortable  Respiratory system: Clear to auscultation. Respiratory effort normal. No respiratory distress. No conversational dyspnea.  Cardiovascular system: S1 & S2 heard, regular rate, irregularly irregular rhythm. No murmurs. 1+ pitting bilateral lower extremity edema tapering at the knees bilaterally. Gastrointestinal system: Abdomen is nondistended, soft and nontender. Normal bowel sounds heard. Central nervous system: Alert and oriented. No focal neurological deficits. Speech clear.  Extremities: Symmetric in appearance, no calf TTP bilaterally, negative Homans bilaterally Skin: No rashes, lesions or ulcers on exposed skin  Psychiatry: Judgement and insight appear normal. Mood & affect appropriate.   Data Reviewed: I have personally reviewed following labs and imaging studies  CBC: Recent Labs  Lab 03/08/23 1655 03/09/23 0611  WBC 8.8 6.6  NEUTROABS 5.9  --   HGB 13.7 12.7  HCT 42.6 39.6  MCV 94.7 94.3  PLT 224 185   Basic Metabolic Panel: Recent Labs  Lab 03/08/23 1655 03/09/23 0611  NA 142 140  K 3.9 3.3*  CL 107 104  CO2 23 26  GLUCOSE 107* 100*  BUN 24* 22  CREATININE 0.74 0.61  CALCIUM 8.9 8.9  MG 1.9  --    GFR: Estimated Creatinine Clearance: 80.8 mL/min (by C-G formula based on SCr of 0.61 mg/dL).  Liver Function Tests: Recent Labs  Lab 03/08/23 1655  AST 17  ALT 11  ALKPHOS 59  BILITOT 0.6  PROT 6.3*  ALBUMIN 3.4*   Thyroid Function Tests: Recent Labs    03/08/23 1655  TSH 2.296  FREET4 1.24*    Recent Results (from the past 240 hours)  Resp panel by RT-PCR (RSV, Flu A&B,  Covid) Anterior Nasal Swab     Status: None   Collection Time: 03/08/23  5:00 PM   Specimen: Anterior Nasal Swab  Result Value Ref Range Status   SARS Coronavirus 2 by RT PCR NEGATIVE NEGATIVE Final   Influenza A by PCR NEGATIVE NEGATIVE Final   Influenza B by PCR NEGATIVE NEGATIVE Final    Comment: (NOTE) The Xpert Xpress SARS-CoV-2/FLU/RSV plus assay is intended as an aid in the diagnosis of influenza from Nasopharyngeal swab specimens and should not be used as a sole basis for treatment. Nasal washings and aspirates are unacceptable for Xpert Xpress SARS-CoV-2/FLU/RSV testing.  Fact Sheet for Patients: BloggerCourse.com  Fact Sheet for Healthcare Providers: SeriousBroker.it  This test is not yet approved or cleared by the [place] FDA and has been authorized for detection and/or diagnosis of SARS-CoV-2 by FDA under an Emergency Use Authorization (EUA). This EUA will remain in effect (meaning this test can be used) for the duration of the COVID-19 declaration under Section 564(b)(1) of the Act, 21 U.S.C. section 360bbb-3(b)(1), unless the authorization is terminated or revoked.     Resp Syncytial Virus by PCR NEGATIVE NEGATIVE Final    Comment: (NOTE) Fact Sheet for Patients: BloggerCourse.com  Fact Sheet for Healthcare Providers: SeriousBroker.it  This test is not yet approved or cleared by the [place] FDA and has been authorized for detection and/or diagnosis of SARS-CoV-2 by FDA under an Emergency Use Authorization (EUA). This EUA will remain in effect (meaning this test can be used) for the duration of the COVID-19 declaration under Section 564(b)(1) of the Act, 21 U.S.C. section 360bbb-3(b)(1), unless the authorization is terminated or revoked.  Performed at [facility], [address] [place], [place] [address]       Radiology Studies: DG  Chest Portable 1 View Result Date: 03/08/2023 CLINICAL DATA:  Cough, shortness of breath EXAM: PORTABLE CHEST 1 VIEW COMPARISON:  01/13/2021 FINDINGS: Cardiomegaly, vascular congestion. Diffuse interstitial prominence throughout the lungs. Bilateral lower lobe airspace opacities, favor edema. No definite effusions. No pneumothorax. No acute bony abnormality. IMPRESSION: Cardiomegaly, mild pulmonary edema. Electronically Signed   By: Charlett Nose M.D.   On: 03/08/2023 18:04     Scheduled Meds:  apixaban  5 mg Oral BID   diltiazem  240 mg Oral Daily   losartan  100 mg Oral Daily   And   hydrochlorothiazide  25 mg Oral Daily   levothyroxine  125 mcg Oral QAC breakfast   mirabegron ER  25 mg Oral Daily   potassium chloride  20 mEq Oral BID   rosuvastatin  20 mg Oral Daily   Continuous Infusions:  diltiazem (CARDIZEM) infusion 10 mg/hr (03/09/23 0851)     LOS: 0 days    Time spent: 35 minutes   Sharlene Dory, DO [facility] 03/09/2023, 12:43 PM   Available via Epic secure chat 7am-7pm After these  hours, please refer to coverage provider listed on [email or web address]

## 2023-03-09 NOTE — ED Notes (Signed)
Patient moved from stretcher to chair. Patient's O2 was off during transition and dropped to 88%, patient reapplied O2 at 2L

## 2023-03-09 NOTE — ED Notes (Signed)
Cardizem drip can be discontinued at 1513, PO meds given at 1413.

## 2023-03-10 ENCOUNTER — Observation Stay (HOSPITAL_COMMUNITY): Payer: Medicare Other

## 2023-03-10 DIAGNOSIS — E7800 Pure hypercholesterolemia, unspecified: Secondary | ICD-10-CM | POA: Diagnosis present

## 2023-03-10 DIAGNOSIS — E871 Hypo-osmolality and hyponatremia: Secondary | ICD-10-CM | POA: Diagnosis not present

## 2023-03-10 DIAGNOSIS — J984 Other disorders of lung: Secondary | ICD-10-CM | POA: Diagnosis not present

## 2023-03-10 DIAGNOSIS — I340 Nonrheumatic mitral (valve) insufficiency: Secondary | ICD-10-CM | POA: Diagnosis not present

## 2023-03-10 DIAGNOSIS — J9601 Acute respiratory failure with hypoxia: Secondary | ICD-10-CM | POA: Diagnosis present

## 2023-03-10 DIAGNOSIS — J309 Allergic rhinitis, unspecified: Secondary | ICD-10-CM | POA: Diagnosis present

## 2023-03-10 DIAGNOSIS — Z6841 Body Mass Index (BMI) 40.0 and over, adult: Secondary | ICD-10-CM | POA: Diagnosis not present

## 2023-03-10 DIAGNOSIS — I429 Cardiomyopathy, unspecified: Secondary | ICD-10-CM | POA: Diagnosis not present

## 2023-03-10 DIAGNOSIS — G4733 Obstructive sleep apnea (adult) (pediatric): Secondary | ICD-10-CM | POA: Diagnosis not present

## 2023-03-10 DIAGNOSIS — E0580 Other thyrotoxicosis without thyrotoxic crisis or storm: Secondary | ICD-10-CM | POA: Diagnosis not present

## 2023-03-10 DIAGNOSIS — I5021 Acute systolic (congestive) heart failure: Secondary | ICD-10-CM | POA: Diagnosis not present

## 2023-03-10 DIAGNOSIS — I5022 Chronic systolic (congestive) heart failure: Secondary | ICD-10-CM | POA: Diagnosis not present

## 2023-03-10 DIAGNOSIS — E875 Hyperkalemia: Secondary | ICD-10-CM | POA: Diagnosis not present

## 2023-03-10 DIAGNOSIS — I517 Cardiomegaly: Secondary | ICD-10-CM | POA: Diagnosis not present

## 2023-03-10 DIAGNOSIS — Z87891 Personal history of nicotine dependence: Secondary | ICD-10-CM | POA: Diagnosis not present

## 2023-03-10 DIAGNOSIS — I509 Heart failure, unspecified: Secondary | ICD-10-CM | POA: Diagnosis not present

## 2023-03-10 DIAGNOSIS — R0602 Shortness of breath: Secondary | ICD-10-CM | POA: Diagnosis not present

## 2023-03-10 DIAGNOSIS — I4891 Unspecified atrial fibrillation: Secondary | ICD-10-CM | POA: Diagnosis not present

## 2023-03-10 DIAGNOSIS — E039 Hypothyroidism, unspecified: Secondary | ICD-10-CM | POA: Diagnosis not present

## 2023-03-10 DIAGNOSIS — I5032 Chronic diastolic (congestive) heart failure: Secondary | ICD-10-CM | POA: Diagnosis not present

## 2023-03-10 DIAGNOSIS — E876 Hypokalemia: Secondary | ICD-10-CM | POA: Diagnosis not present

## 2023-03-10 DIAGNOSIS — E66813 Obesity, class 3: Secondary | ICD-10-CM | POA: Diagnosis present

## 2023-03-10 DIAGNOSIS — I10 Essential (primary) hypertension: Secondary | ICD-10-CM | POA: Diagnosis not present

## 2023-03-10 DIAGNOSIS — J069 Acute upper respiratory infection, unspecified: Secondary | ICD-10-CM | POA: Diagnosis not present

## 2023-03-10 DIAGNOSIS — Z7901 Long term (current) use of anticoagulants: Secondary | ICD-10-CM | POA: Diagnosis not present

## 2023-03-10 DIAGNOSIS — I5042 Chronic combined systolic (congestive) and diastolic (congestive) heart failure: Secondary | ICD-10-CM | POA: Diagnosis not present

## 2023-03-10 DIAGNOSIS — N3946 Mixed incontinence: Secondary | ICD-10-CM | POA: Diagnosis present

## 2023-03-10 DIAGNOSIS — K219 Gastro-esophageal reflux disease without esophagitis: Secondary | ICD-10-CM | POA: Diagnosis present

## 2023-03-10 DIAGNOSIS — I081 Rheumatic disorders of both mitral and tricuspid valves: Secondary | ICD-10-CM | POA: Diagnosis not present

## 2023-03-10 DIAGNOSIS — E785 Hyperlipidemia, unspecified: Secondary | ICD-10-CM | POA: Diagnosis not present

## 2023-03-10 DIAGNOSIS — J9811 Atelectasis: Secondary | ICD-10-CM | POA: Diagnosis not present

## 2023-03-10 DIAGNOSIS — I110 Hypertensive heart disease with heart failure: Secondary | ICD-10-CM | POA: Diagnosis not present

## 2023-03-10 DIAGNOSIS — Z7984 Long term (current) use of oral hypoglycemic drugs: Secondary | ICD-10-CM | POA: Diagnosis not present

## 2023-03-10 DIAGNOSIS — Z79899 Other long term (current) drug therapy: Secondary | ICD-10-CM | POA: Diagnosis not present

## 2023-03-10 DIAGNOSIS — Z1152 Encounter for screening for COVID-19: Secondary | ICD-10-CM | POA: Diagnosis not present

## 2023-03-10 DIAGNOSIS — Z79890 Hormone replacement therapy (postmenopausal): Secondary | ICD-10-CM | POA: Diagnosis not present

## 2023-03-10 DIAGNOSIS — I361 Nonrheumatic tricuspid (valve) insufficiency: Secondary | ICD-10-CM | POA: Diagnosis not present

## 2023-03-10 DIAGNOSIS — I420 Dilated cardiomyopathy: Secondary | ICD-10-CM | POA: Diagnosis not present

## 2023-03-10 DIAGNOSIS — I5023 Acute on chronic systolic (congestive) heart failure: Secondary | ICD-10-CM | POA: Diagnosis not present

## 2023-03-10 DIAGNOSIS — I480 Paroxysmal atrial fibrillation: Secondary | ICD-10-CM | POA: Diagnosis not present

## 2023-03-10 LAB — BASIC METABOLIC PANEL
Anion gap: 9 (ref 5–15)
BUN: 19 mg/dL (ref 8–23)
CO2: 27 mmol/L (ref 22–32)
Calcium: 9 mg/dL (ref 8.9–10.3)
Chloride: 103 mmol/L (ref 98–111)
Creatinine, Ser: 0.63 mg/dL (ref 0.44–1.00)
GFR, Estimated: >60 mL/min (ref >60)
Glucose, Bld: 101 mg/dL — ABNORMAL HIGH (ref 70–99)
Potassium: 3.7 mmol/L (ref 3.5–5.1)
Sodium: 139 mmol/L (ref 135–145)

## 2023-03-10 LAB — ECHOCARDIOGRAM COMPLETE
AR max vel: 1.86 cm2
AV Peak grad: 12.1 mm[Hg]
Ao pk vel: 1.74 m/s
Area-P 1/2: 4.08 cm2
Est EF: 30
Height: 66 in
MV M vel: 4.44 m/s
MV Peak grad: 78.9 mm[Hg]
Weight: 4400 [oz_av]

## 2023-03-10 LAB — CBC
HCT: 37.9 % (ref 36.0–46.0)
Hemoglobin: 12.3 g/dL (ref 12.0–15.0)
MCH: 29.9 pg (ref 26.0–34.0)
MCHC: 32.5 g/dL (ref 30.0–36.0)
MCV: 92.2 fL (ref 80.0–100.0)
Platelets: 188 10*3/uL (ref 150–400)
RBC: 4.11 MIL/uL (ref 3.87–5.11)
RDW: 15.1 % (ref 11.5–15.5)
WBC: 6.7 10*3/uL (ref 4.0–10.5)
nRBC: 0 % (ref 0.0–0.2)

## 2023-03-10 MED ORDER — PERFLUTREN LIPID MICROSPHERE
1.0000 mL | INTRAVENOUS | Status: AC | PRN
  Administered 2023-03-10: 6 mL via INTRAVENOUS

## 2023-03-10 MED ORDER — FUROSEMIDE 10 MG/ML IJ SOLN
40.0000 mg | Freq: Every day | INTRAMUSCULAR | Status: DC
  Administered 2023-03-10 – 2023-03-11 (×2): 40 mg via INTRAVENOUS
  Filled 2023-03-10 (×2): qty 4

## 2023-03-10 MED ORDER — POTASSIUM CHLORIDE CRYS ER 20 MEQ PO TBCR
20.0000 meq | EXTENDED_RELEASE_TABLET | Freq: Every day | ORAL | Status: DC
Start: 2023-03-10 — End: 2023-03-11
  Administered 2023-03-10: 20 meq via ORAL
  Filled 2023-03-10: qty 1

## 2023-03-10 MED ORDER — DILTIAZEM HCL ER COATED BEADS 180 MG PO CP24
300.0000 mg | ORAL_CAPSULE | Freq: Every day | ORAL | Status: DC
  Administered 2023-03-10: 300 mg via ORAL
  Filled 2023-03-10: qty 1

## 2023-03-10 NOTE — Plan of Care (Signed)

## 2023-03-10 NOTE — Consult Note (Signed)
Cardiology Consultation   Patient ID: ONI ORE MRN: [id number]; DOB: 1946/01/03  Admit date: 03/08/2023 Date of Consult: 03/11/2023  PCP:  Collene Mares, PA   [facility] Providers Cardiologist:  None      Dr Mayford Knife saw in 2019 for OSA  Patient Profile:   Cassandra Ho is a 78 y.o. female with a hx of HTN, HLD, recent URI, GERD, Pituitary tumor, acquired hypothyroid, OSA, morbid obesity.   She went to [facility] for the URI symptoms and was diagnosed with Afib RVR.  She was transferred to the hospital and was started on IV Cardizem.  She was changed to p.o. Cardizem CD at 240 mg daily >> 300 mg every day for better HR/BP control.  It is not clear if she was ever in sinus rhythm.  Her TSH was normal at 2.296 but her free T4 was elevated at 1.24, her Synthroid was reduced from 150 mcg daily to 125 mcg daily.  An echo was performed which showed an EF of 30%.    She is being seen 03/11/2023 for the evaluation of new LVD at the request of Dr  Jomarie Longs.  History of Present Illness:   [name] has been sick with an upper respiratory illness for 3 weeks.  Symptoms started just before Christmas.  She does not normally follow her heart rate and blood pressure at home and had not checked it recently.  She noticed that with this illness, she did not have energy she normally does.  At the doctor's office, her heart rate has always been normal.  She has never had palpitations, presyncope or syncope.  Finally, she was just getting more more short of breath and came to the hospital.  Her heart rate improved with Cardizem which was changed to p.o. and uptitrated to 300 mg daily.  Before her acute illness, she was not having any problems with increase dyspnea on exertion.  Since her thyroid was irradiated, her sleep apnea disappeared and she no longer needs the CPAP.  She occasionally will feel some palpitations, and has an old prescription for propranolol  which she will take.  She had 1 in the last few weeks because of this illness, but does not generally take it more than once or twice a year.  Starting 01/19, she got IV Lasix 40 mg daily and feels that her breathing is much improved.  Currently, she feels pretty good.  Feels she is tolerating the Eliquis well.   Past Medical History:  Diagnosis Date   Allergic rhinitis    Arthritis    generalized-ankles and knees   BCC (basal cell carcinoma of skin)    Constipation    Dyslipidemia    Edema of both lower extremities    GERD (gastroesophageal reflux disease)    H/O: pituitary tumor    "irradicated" no problems now   Headache    migraines in past   High cholesterol    Hypertension    Hypothyroidism    Hypothyroidism (acquired)    Post-ablation   Joint pain    Morbid obesity (HCC)    OSA (obstructive sleep apnea)    intolerant to CPAP and now using an oral device   Osteoarthritis    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 with 31.4 mCi of I-131 on 12/05/2007   Transfusion history    with hip surgery    Past Surgical History:  Procedure Laterality Date   ANKLE ARTHROTOMY Left  ankle and foot reconstruction ;retained hardware   BREAST BIOPSY Bilateral    Many years ago, no scar seen    BREAST SURGERY     x2 biopsies(benign)   CARPAL TUNNEL RELEASE Bilateral    COLONOSCOPY WITH PROPOFOL N/A 06/15/2014   Procedure: COLONOSCOPY WITH PROPOFOL;  Surgeon: Charolett Bumpers, MD;  Location: [facility];  Service: Endoscopy;  Laterality: N/A;   D&C  1977   DILATION AND CURETTAGE OF UTERUS  1973   SAB   DILATION AND CURETTAGE OF UTERUS  1974   DILATION AND CURETTAGE OF UTERUS  1975   DILATION AND CURETTAGE OF UTERUS  1978   DILATION AND CURETTAGE OF UTERUS  1991   HIP FRACTURE SURGERY     repaired with cader bone and metal plates   IUD REMOVAL  2000   hysteroscopy   KNEE ARTHROSCOPY Right    torn meniscus       Home Medications:  Prior to  Admission medications   Medication Sig Start Date End Date Taking? Authorizing Provider  aspirin 81 MG tablet Take 81 mg by mouth daily.   Yes [provider]  Calcium Carbonate-Vit D-Min (CALCIUM 1200 PO) Take 1 tablet by mouth daily.   Yes [provider]  conjugated estrogens (PREMARIN) vaginal cream Place 1 Applicatorful vaginally 2 (two) times a week. Place 0.5g nightly for two weeks then twice a week after 01/07/23  Yes Wyatt Haste T, MD  famotidine (PEPCID) 20 MG tablet Take 20 mg by mouth daily as needed for heartburn or indigestion.   Yes [provider]  ibuprofen (ADVIL) 200 MG tablet Take 800 mg by mouth every 8 (eight) hours as needed for mild pain (pain score 1-3) or moderate pain (pain score 4-6).   Yes [provider]  levothyroxine (SYNTHROID, LEVOTHROID) 150 MCG tablet Take 150 mcg by mouth daily before breakfast.   Yes [provider]  loratadine (CLARITIN) 10 MG tablet Take 10 mg by mouth daily as needed for allergies.   Yes [provider]  losartan-hydrochlorothiazide (HYZAAR) 100-25 MG tablet Take 1 tablet by mouth daily. 11/27/21  Yes Corinna Capra A, DO  psyllium (HYDROCIL/METAMUCIL) 95 % PACK Take 1 packet by mouth daily.   Yes [provider]  rosuvastatin (CRESTOR) 20 MG tablet Take 1 tablet (20 mg total) by mouth daily. Please make overdue appt with Dr. Mayford Knife before anymore refills. 2nd attempt 01/12/19  Yes Turner, Cornelious Bryant, MD  Vibegron (GEMTESA) 75 MG TABS Take 1 tablet (75 mg total) by mouth daily. 01/07/23  Yes Loleta Chance, MD    Inpatient Medications: Scheduled Meds:  apixaban  5 mg Oral BID   furosemide  40 mg Intravenous BID   levothyroxine  125 mcg Oral QAC breakfast   losartan  50 mg Oral Daily   metoprolol succinate  25 mg Oral Daily   mirabegron ER  25 mg Oral Daily   potassium chloride  40 mEq Oral BID   rosuvastatin  20 mg Oral Daily   Continuous Infusions:  PRN Meds: acetaminophen,  famotidine, loratadine, ondansetron (ZOFRAN) IV  Allergies:   No Known Allergies  Social History:   Social History   Socioeconomic History   Marital status: Married    Spouse name: Jake Shark   Number of children: Not on file   Years of education: Not on file   Highest education level: Not on file  Occupational History   Occupation: Retired  Tobacco Use   Smoking status: Former  Types: Cigarettes   Smokeless tobacco: Never   Tobacco comments:    Quit '78  Vaping Use   Vaping status: Never Used  Substance and Sexual Activity   Alcohol use: Yes    Comment: ocass   Drug use: No   Sexual activity: Not Currently    Partners: Male    Birth control/protection: Abstinence  Other Topics Concern   Not on file  Social History Narrative   ** Merged History Encounter **       Social Drivers of Health   Financial Resource Strain: Not on file  Food Insecurity: No Food Insecurity (03/10/2023)   Hunger Vital Sign    Worried About Running Out of Food in the Last Year: Never true    Ran Out of Food in the Last Year: Never true  Transportation Needs: No Transportation Needs (03/10/2023)   PRAPARE - Administrator, Civil Service (Medical): No    Lack of Transportation (Non-Medical): No  Physical Activity: Not on file  Stress: Not on file  Social Connections: Unknown (03/10/2023)   Social Connection and Isolation Panel [NHANES]    Frequency of Communication with Friends and Family: More than three times a week    Frequency of Social Gatherings with Friends and Family: More than three times a week    Attends Religious Services: Patient declined    Database administrator or Organizations: Patient declined    Attends Banker Meetings: Patient declined    Marital Status: Patient declined  Intimate Partner Violence: Not At Risk (03/10/2023)   Humiliation, Afraid, Rape, and Kick questionnaire    Fear of Current or Ex-Partner: No    Emotionally Abused: No     Physically Abused: No    Sexually Abused: No    Family History:   Family History  Problem Relation Age of Onset   Thyroid cancer Mother    Lung cancer Mother    Melanoma Mother    Diabetes Mother    Thyroid disease Mother    Alcohol abuse Mother    Bladder Cancer Mother    Alcoholism Father    Cirrhosis Father    High blood pressure Father    High Cholesterol Father    Fibromyalgia Sister    Breast cancer Sister        30s   Breast cancer Maternal Grandmother        36s   Breast cancer Paternal Grandmother        18s   Breast cancer Daughter 80   Breast cancer Maternal Aunt        60s     ROS:  Please see the history of present illness.  All other ROS reviewed and negative.     Physical Exam/Data:   Vitals:   03/10/23 1935 03/11/23 0045 03/11/23 0450 03/11/23 0802  BP: 98/81 108/66 131/74 (!) 119/94  Pulse: 87 92 94 85  Resp: 19 13 17    Temp: 98.2 F (36.8 C) 97.8 F (36.6 C) 97.8 F (36.6 C) 97.9 F (36.6 C)  TempSrc: Oral Oral Oral Oral  SpO2: 92% 93% 94% 91%  Weight:      Height:        Intake/Output Summary (Last 24 hours) at 03/11/2023 1046 Last data filed at 03/11/2023 0600 Gross per 24 hour  Intake 480 ml  Output --  Net 480 ml      03/08/2023    4:44 PM 01/07/2023    1:46 PM 11/27/2021  11:00 AM  Last 3 Weights  Weight (lbs) 275 lb 298 lb 275 lb  Weight (kg) 124.739 kg 135.172 kg 124.739 kg     Body mass index is 44.39 kg/m.  General:  Well nourished, morbidly obese female, in no acute distress HEENT: normal for age Neck: JVD is difficult to assess, but appears about 10 cm Vascular: No carotid bruits; Distal pulses 2+ bilaterally Cardiac:  normal S1, S2; irregular rate and rhythm; no murmur  Lungs:  clear to auscultation bilaterally, no wheezing, rhonchi or rales  Abd: soft, nontender, no hepatomegaly  Ext: no edema Musculoskeletal:  No deformities, BUE and BLE strength normal and equal Skin: warm and dry  Neuro:  CNs 2-12 intact, no  focal abnormalities noted Psych:  Normal affect   EKG:  The EKG was personally reviewed and demonstrates: Atrial fibrillation, RVR Telemetry:  Telemetry was personally reviewed and demonstrates: Atrial fibrillation, RVR initially and rate improved from 170s down to the 100s on current therapy  Relevant CV Studies:  ECHO: 03/10/2023  1. Left ventricular ejection fraction, by estimation, is 30%. The left ventricle has moderately decreased function. The left ventricle demonstrates global hypokinesis. Left ventricular diastolic function could not be evaluated.   2. Right ventricular systolic function is normal. The right ventricular size is normal.   3. The mitral valve was not well visualized. Mild mitral valve  regurgitation. No evidence of mitral stenosis.   4. The aortic valve was not well visualized. Aortic valve regurgitation is not visualized.   5. The inferior vena cava is dilated in size with >50% respiratory variability, suggesting right atrial pressure of 8 mmHg.   EF by echo in 2013 was 75%  Laboratory Data:  High Sensitivity Troponin:  none   Chemistry Recent Labs  Lab 03/08/23 1655 03/09/23 0611 03/10/23 0259 03/11/23 0324  NA 142 140 139 138  K 3.9 3.3* 3.7 3.4*  CL 107 104 103 101  CO2 23 26 27 26   GLUCOSE 107* 100* 101* 109*  BUN 24* 22 19 20   CREATININE 0.74 0.61 0.63 0.62  CALCIUM 8.9 8.9 9.0 8.9  MG 1.9  --   --   --   GFRNONAA >60 >60 >60 >60  ANIONGAP 12 10 9 11     Recent Labs  Lab 03/08/23 1655  PROT 6.3*  ALBUMIN 3.4*  AST 17  ALT 11  ALKPHOS 59  BILITOT 0.6   Lipids No results for input(s): "CHOL", "TRIG", "HDL", "LABVLDL", "LDLCALC", "CHOLHDL" in the last 168 hours.  Hematology Recent Labs  Lab 03/09/23 0611 03/10/23 0259 03/11/23 0324  WBC 6.6 6.7 6.2  RBC 4.20 4.11 4.03  HGB 12.7 12.3 12.1  HCT 39.6 37.9 38.1  MCV 94.3 92.2 94.5  MCH 30.2 29.9 30.0  MCHC 32.1 32.5 31.8  RDW 15.0 15.1 15.3  PLT 185 188 179   Thyroid  Recent  Labs  Lab 03/08/23 1655  TSH 2.296  FREET4 1.24*    T3, Free  Date Value Ref Range Status  03/08/2023 2.5 2.0 - 4.4 pg/mL Final    Comment:    (NOTE) Performed At: [facility] [address] [place], [place] [id number] Jolene Schimke MD NA:[id number]     Radiology/Studies:  DG CHEST PORT 1 VIEW Result Date: 03/10/2023 CLINICAL DATA:  Acute hypoxemic respiratory failure. EXAM: PORTABLE CHEST 1 VIEW COMPARISON:  Chest radiograph dated 03/08/2023. FINDINGS: The heart is enlarged. There is mild-to-moderate bilateral lower lung predominant atelectasis/airspace disease. A small right pleural effusion may contribute.  No pneumothorax. Degenerative changes are seen in the spine. IMPRESSION: Mild-to-moderate bilateral lower lung predominant atelectasis/airspace disease. A small right pleural effusion may contribute. Electronically Signed   By: Romona Curls M.D.   On: 03/10/2023 13:12   ECHOCARDIOGRAM COMPLETE Result Date: 03/10/2023    ECHOCARDIOGRAM REPORT   Patient Name:   CELENIA PARAMO Date of Exam: 03/10/2023 Medical Rec #:  [id number]           Height:       66.0 in Accession #:    [id number]          Weight:       275.0 lb Date of Birth:  1945/07/04           BSA:          2.290 m Patient Age:    77 years            BP:           123/99 mmHg Patient Gender: F                   HR:           108 bpm. Exam Location:  Inpatient Procedure: 2D Echo, Cardiac Doppler, Color Doppler and Intracardiac            Opacification Agent Indications:    Atrial Fibrillation I48.91  History:        Patient has no prior history of Echocardiogram examinations.                 CHF, Arrythmias:Atrial Fibrillation; Risk Factors:Hypertension                 and Sleep Apnea.  Sonographer:    [name] RCS Referring Phys: [id number] Jilda Roche [facility]  Sonographer Comments: Patient is obese. IMPRESSIONS  1. Left ventricular ejection fraction, by estimation, is 30%. The left ventricle has moderately  decreased function. The left ventricle demonstrates global hypokinesis. Left ventricular diastolic function could not be evaluated.  2. Right ventricular systolic function is normal. The right ventricular size is normal.  3. The mitral valve was not well visualized. Mild mitral valve regurgitation. No evidence of mitral stenosis.  4. The aortic valve was not well visualized. Aortic valve regurgitation is not visualized.  5. The inferior vena cava is dilated in size with >50% respiratory variability, suggesting right atrial pressure of 8 mmHg. Comparison(s): No prior Echocardiogram. FINDINGS  Left Ventricle: Left ventricular ejection fraction, by estimation, is 30%. The left ventricle has moderately decreased function. The left ventricle demonstrates global hypokinesis. Definity contrast agent was given IV to delineate the left ventricular endocardial borders. The left ventricular internal cavity size was normal in size. Suboptimal image quality limits for assessment of left ventricular hypertrophy. Left ventricular diastolic function could not be evaluated due to atrial fibrillation. Left  ventricular diastolic function could not be evaluated. Right Ventricle: The right ventricular size is normal. No increase in right ventricular wall thickness. Right ventricular systolic function is normal. Left Atrium: Left atrial size was normal in size. Right Atrium: Right atrial size was normal in size. Pericardium: Trivial pericardial effusion is present. The pericardial effusion is surrounding the apex. Presence of epicardial fat layer. Mitral Valve: The mitral valve was not well visualized. Mild mitral valve regurgitation. No evidence of mitral valve stenosis. Tricuspid Valve: The tricuspid valve is not well visualized. Tricuspid valve regurgitation is trivial. No evidence of tricuspid stenosis. Aortic Valve: The aortic valve was not well  visualized. Aortic valve regurgitation is not visualized. Aortic valve peak gradient  measures 12.1 mmHg. Pulmonic Valve: The pulmonic valve was not well visualized. Pulmonic valve regurgitation is not visualized. No evidence of pulmonic stenosis. Aorta: The aortic root and ascending aorta are structurally normal, with no evidence of dilitation. Venous: The inferior vena cava is dilated in size with greater than 50% respiratory variability, suggesting right atrial pressure of 8 mmHg. IAS/Shunts: The atrial septum is grossly normal.  LEFT VENTRICLE PLAX 2D LVOT diam:     2.10 cm   Diastology LV SV:         48        LV e' medial:    8.59 cm/s LV SV Index:   21        LV E/e' medial:  13.7 LVOT Area:     3.46 cm  LV e' lateral:   11.05 cm/s                          LV E/e' lateral: 10.7  RIGHT VENTRICLE             IVC RV S prime:     12.90 cm/s  IVC diam: 2.70 cm TAPSE (M-mode): 1.7 cm LEFT ATRIUM           Index        RIGHT ATRIUM           Index LA Vol (A4C): 63.2 ml 27.60 ml/m  RA Area:     19.95 cm                                    RA Volume:   56.75 ml  24.79 ml/m  AORTIC VALVE AV Area (Vmax): 1.86 cm AV Vmax:        174.00 cm/s AV Peak Grad:   12.1 mmHg LVOT Vmax:      93.40 cm/s LVOT Vmean:     66.400 cm/s LVOT VTI:       0.138 m  AORTA Ao Root diam: 3.60 cm Ao Asc diam:  3.10 cm MITRAL VALVE MV Area (PHT): 4.08 cm     SHUNTS MV Decel Time: 186 msec     Systemic VTI:  0.14 m MR Peak grad: 78.9 mmHg     Systemic Diam: 2.10 cm MR Vmax:      444.00 cm/s MV E velocity: 118.00 cm/s Riley Lam MD Electronically signed by Riley Lam MD Signature Date/Time: 03/10/2023/12:04:19 PM    Final    DG Chest Portable 1 View Result Date: 03/08/2023 CLINICAL DATA:  Cough, shortness of breath EXAM: PORTABLE CHEST 1 VIEW COMPARISON:  01/13/2021 FINDINGS: Cardiomegaly, vascular congestion. Diffuse interstitial prominence throughout the lungs. Bilateral lower lobe airspace opacities, favor edema. No definite effusions. No pneumothorax. No acute bony abnormality. IMPRESSION:  Cardiomegaly, mild pulmonary edema. Electronically Signed   By: Charlett Nose M.D.   On: 03/08/2023 18:04     Assessment and Plan:   Atrial fibrillation, RVR -Discuss with MD options for rate control - with her EF 30%, Cardizem is not the best drug for her -She is tolerating Eliquis well. -Duration is unknown so will need a TEE cardioversion at some point  2.  HFrEF, new diagnosis with EF 30% -SBP 98-127 on Cardizem CD 300 mg, first dose 01/19 -Will discontinue the HCTZ while she is on Lasix 40 mg IV daily -  Volume status is improved, may not need IV Lasix much longer -Will need to change the Cardizem to a beta-blocker -Renal function is good, will benefit from [place] -She is currently on losartan 100 mg, she would likely tolerate Entresto 50/53 - specific changes and timing will discuss w/ MD  3. Induced hypothyroid - pta on Synthroid 150 mcg/day - free T3 and TSH ok but free T4 elevated so dose reduced to 125 mcg/day - per IM  Other issues, per IM    Risk Assessment/Risk Scores:       New York Heart Association (NYHA) Functional Class NYHA Class III  CHA2DS2-VASc Score = 5   This indicates a 7.2% annual risk of stroke. The patient's score is based upon: CHF History: 1 HTN History: 1 Diabetes History: 0 Stroke History: 0 Vascular Disease History: 0 Age Score: 2 Gender Score: 1    For questions or updates, please contact [facility] Please consult [email or web address] for contact info under    Signed, Theodore Demark, PA-C  03/11/2023 10:46 AM

## 2023-03-10 NOTE — Discharge Instructions (Signed)

## 2023-03-10 NOTE — Progress Notes (Signed)
Echocardiogram 2D Echocardiogram has been performed.  [name] 03/10/2023, 11:54 AM

## 2023-03-10 NOTE — Progress Notes (Addendum)
PROGRESS NOTE    Cassandra Ho  WUJ:[id number] DOB: 11-Jan-1946 DOA: 03/08/2023 PCP: Collene Mares, PA     Brief Narrative:  78 year old female history of hypertension, hyperlipidemia, and hypothyroidism presenting with resolving URI symptoms for the past 2 weeks.  She went to urgent care and heart rate was in the 150s and found in atrial fibrillation.  Remote history of 1 documented episode back in 2022 but not on any medication.  No swelling.  She has had fluttering in her chest for the past 4 days prior to admission.   New events last 24 hours / Subjective: Converted to oral Cardizem, 240 mg daily.  Reports rare palpitations.  Breathing is okay.  No new coughing, fevers, or wheezing.  Echo was recently completed showing an ejection fraction of 30%.  Assessment & Plan:   Principal Problem:   Atrial fibrillation with RVR (HCC)  Resolved  Cardizem 300 mg daily to bring heart rates from the 90s/low 100s elevated lower  Telemetry  Eliquis 5 mg twice daily  Active Problems:   Acute hypoxemic resp failure (HCC)  [place] O2 prn  Tx as outlined below  Likely 2/2 new onset CHF vs infection    Pure hypercholesterolemia  Crestor 20 mg daily    Essential hypertension, benign  Hydrochlorothiazide 25 mg daily  Losartan 100 mg daily    Urinary incontinence, mixed  Despite diuresis, she also urinating frequently so we will continue Myrbetriq 25 mg to help with incontinence at the least    New onset of congestive heart failure (HCC)  EF 30% on echo  Will consult cardiology for their input- Dr. Nelly Laurence agreed to see her, may not be today though  Lasix 40 mg daily along with potassium started today  Monitor BMP  Strict i/o's  Daily weights    Iatrogenic hyperthyroidism  Levothyroxine dosage reduced to 125 mcg daily, recheck outpatient     Hypokalemia  Stable today   DVT prophylaxis: Eliquis Code Status: Full Family Communication: Spouse at bedside Coming From:  Home Disposition Plan: Hopefully home Barriers to Discharge: Clinical improvement/workup  Consultants:  None  Objective: Vitals:   03/10/23 1245 03/10/23 1250 03/10/23 1255 03/10/23 1300  BP:  117/81    Pulse: (!) 111 (!) 110 75 (!) 110  Resp:      Temp:      TempSrc:  Oral    SpO2: 94% 94% 95% 94%  Weight:      Height:        Intake/Output Summary (Last 24 hours) at 03/10/2023 1336 Last data filed at 03/10/2023 0900 Gross per 24 hour  Intake 652.66 ml  Output --  Net 652.66 ml   Filed Weights   03/08/23 1644  Weight: 124.7 kg    Examination:  General exam: Appears calm and comfortable  Respiratory system: Clear to auscultation. Respiratory effort normal. No respiratory distress. No conversational dyspnea.  Cardiovascular system: S1 & S2 heard, RRR. 2 pitting bilateral lower extremity IMA tapering at the proximal third of the tibia. Central nervous system: Alert and oriented. No focal neurological deficits. Speech clear.  Extremities: Symmetric in appearance, ttp around circumference of extremities bilaterally Skin: No rashes, lesions or ulcers on exposed skin  Psychiatry: Judgement and insight appear normal. Mood & affect appropriate.   Data Reviewed: I have personally reviewed following labs and imaging studies  CBC: Recent Labs  Lab 03/08/23 1655 03/09/23 0611 03/10/23 0259  WBC 8.8 6.6 6.7  NEUTROABS 5.9  --   --  HGB 13.7 12.7 12.3  HCT 42.6 39.6 37.9  MCV 94.7 94.3 92.2  PLT 224 185 188   Basic Metabolic Panel: Recent Labs  Lab 03/08/23 1655 03/09/23 0611 03/10/23 0259  NA 142 140 139  K 3.9 3.3* 3.7  CL 107 104 103  CO2 23 26 27   GLUCOSE 107* 100* 101*  BUN 24* 22 19  CREATININE 0.74 0.61 0.63  CALCIUM 8.9 8.9 9.0  MG 1.9  --   --    GFR: Estimated Creatinine Clearance: 80.8 mL/min (by C-G formula based on SCr of 0.63 mg/dL).  Liver Function Tests: Recent Labs  Lab 03/08/23 1655  AST 17  ALT 11  ALKPHOS 59  BILITOT 0.6  PROT  6.3*  ALBUMIN 3.4*   Thyroid Function Tests: Recent Labs    03/08/23 1655  TSH 2.296  FREET4 1.24*    Recent Results (from the past 240 hours)  Resp panel by RT-PCR (RSV, Flu A&B, Covid) Anterior Nasal Swab     Status: None   Collection Time: 03/08/23  5:00 PM   Specimen: Anterior Nasal Swab  Result Value Ref Range Status   SARS Coronavirus 2 by RT PCR NEGATIVE NEGATIVE Final   Influenza A by PCR NEGATIVE NEGATIVE Final   Influenza B by PCR NEGATIVE NEGATIVE Final    Comment: (NOTE) The Xpert Xpress SARS-CoV-2/FLU/RSV plus assay is intended as an aid in the diagnosis of influenza from Nasopharyngeal swab specimens and should not be used as a sole basis for treatment. Nasal washings and aspirates are unacceptable for Xpert Xpress SARS-CoV-2/FLU/RSV testing.  Fact Sheet for Patients: BloggerCourse.com  Fact Sheet for Healthcare Providers: SeriousBroker.it  This test is not yet approved or cleared by the [place] FDA and has been authorized for detection and/or diagnosis of SARS-CoV-2 by FDA under an Emergency Use Authorization (EUA). This EUA will remain in effect (meaning this test can be used) for the duration of the COVID-19 declaration under Section 564(b)(1) of the Act, 21 U.S.C. section 360bbb-3(b)(1), unless the authorization is terminated or revoked.     Resp Syncytial Virus by PCR NEGATIVE NEGATIVE Final    Comment: (NOTE) Fact Sheet for Patients: BloggerCourse.com  Fact Sheet for Healthcare Providers: SeriousBroker.it  This test is not yet approved or cleared by the [place] FDA and has been authorized for detection and/or diagnosis of SARS-CoV-2 by FDA under an Emergency Use Authorization (EUA). This EUA will remain in effect (meaning this test can be used) for the duration of the COVID-19 declaration under Section 564(b)(1) of the Act, 21  U.S.C. section 360bbb-3(b)(1), unless the authorization is terminated or revoked.  Performed at [facility], [address], [place], [place] [address]       Radiology Studies: DG CHEST PORT 1 VIEW Result Date: 03/10/2023 CLINICAL DATA:  Acute hypoxemic respiratory failure. EXAM: PORTABLE CHEST 1 VIEW COMPARISON:  Chest radiograph dated 03/08/2023. FINDINGS: The heart is enlarged. There is mild-to-moderate bilateral lower lung predominant atelectasis/airspace disease. A small right pleural effusion may contribute. No pneumothorax. Degenerative changes are seen in the spine. IMPRESSION: Mild-to-moderate bilateral lower lung predominant atelectasis/airspace disease. A small right pleural effusion may contribute. Electronically Signed   By: Romona Curls M.D.   On: 03/10/2023 13:12   ECHOCARDIOGRAM COMPLETE Result Date: 03/10/2023    ECHOCARDIOGRAM REPORT   Patient Name:   JENNELLE JEANTY Date of Exam: 03/10/2023 Medical Rec #:  [id number]           Height:  66.0 in Accession #:    [id number]          Weight:       275.0 lb Date of Birth:  Aug 15, 1945           BSA:          2.290 m Patient Age:    77 years            BP:           123/99 mmHg Patient Gender: F                   HR:           108 bpm. Exam Location:  Inpatient Procedure: 2D Echo, Cardiac Doppler, Color Doppler and Intracardiac            Opacification Agent Indications:    Atrial Fibrillation I48.91  History:        Patient has no prior history of Echocardiogram examinations.                 CHF, Arrythmias:Atrial Fibrillation; Risk Factors:Hypertension                 and Sleep Apnea.  Sonographer:    [name] RCS Referring Phys: [id number] Jilda Roche [facility]  Sonographer Comments: Patient is obese. IMPRESSIONS  1. Left ventricular ejection fraction, by estimation, is 30%. The left ventricle has moderately decreased function. The left ventricle demonstrates global hypokinesis. Left ventricular diastolic function  could not be evaluated.  2. Right ventricular systolic function is normal. The right ventricular size is normal.  3. The mitral valve was not well visualized. Mild mitral valve regurgitation. No evidence of mitral stenosis.  4. The aortic valve was not well visualized. Aortic valve regurgitation is not visualized.  5. The inferior vena cava is dilated in size with >50% respiratory variability, suggesting right atrial pressure of 8 mmHg. Comparison(s): No prior Echocardiogram. FINDINGS  Left Ventricle: Left ventricular ejection fraction, by estimation, is 30%. The left ventricle has moderately decreased function. The left ventricle demonstrates global hypokinesis. Definity contrast agent was given IV to delineate the left ventricular endocardial borders. The left ventricular internal cavity size was normal in size. Suboptimal image quality limits for assessment of left ventricular hypertrophy. Left ventricular diastolic function could not be evaluated due to atrial fibrillation. Left  ventricular diastolic function could not be evaluated. Right Ventricle: The right ventricular size is normal. No increase in right ventricular wall thickness. Right ventricular systolic function is normal. Left Atrium: Left atrial size was normal in size. Right Atrium: Right atrial size was normal in size. Pericardium: Trivial pericardial effusion is present. The pericardial effusion is surrounding the apex. Presence of epicardial fat layer. Mitral Valve: The mitral valve was not well visualized. Mild mitral valve regurgitation. No evidence of mitral valve stenosis. Tricuspid Valve: The tricuspid valve is not well visualized. Tricuspid valve regurgitation is trivial. No evidence of tricuspid stenosis. Aortic Valve: The aortic valve was not well visualized. Aortic valve regurgitation is not visualized. Aortic valve peak gradient measures 12.1 mmHg. Pulmonic Valve: The pulmonic valve was not well visualized. Pulmonic valve regurgitation  is not visualized. No evidence of pulmonic stenosis. Aorta: The aortic root and ascending aorta are structurally normal, with no evidence of dilitation. Venous: The inferior vena cava is dilated in size with greater than 50% respiratory variability, suggesting right atrial pressure of 8 mmHg. IAS/Shunts: The atrial septum is grossly normal.  LEFT VENTRICLE PLAX 2D LVOT  diam:     2.10 cm   Diastology LV SV:         48        LV e' medial:    8.59 cm/s LV SV Index:   21        LV E/e' medial:  13.7 LVOT Area:     3.46 cm  LV e' lateral:   11.05 cm/s                          LV E/e' lateral: 10.7  RIGHT VENTRICLE             IVC RV S prime:     12.90 cm/s  IVC diam: 2.70 cm TAPSE (M-mode): 1.7 cm LEFT ATRIUM           Index        RIGHT ATRIUM           Index LA Vol (A4C): 63.2 ml 27.60 ml/m  RA Area:     19.95 cm                                    RA Volume:   56.75 ml  24.79 ml/m  AORTIC VALVE AV Area (Vmax): 1.86 cm AV Vmax:        174.00 cm/s AV Peak Grad:   12.1 mmHg LVOT Vmax:      93.40 cm/s LVOT Vmean:     66.400 cm/s LVOT VTI:       0.138 m  AORTA Ao Root diam: 3.60 cm Ao Asc diam:  3.10 cm MITRAL VALVE MV Area (PHT): 4.08 cm     SHUNTS MV Decel Time: 186 msec     Systemic VTI:  0.14 m MR Peak grad: 78.9 mmHg     Systemic Diam: 2.10 cm MR Vmax:      444.00 cm/s MV E velocity: 118.00 cm/s Riley Lam MD Electronically signed by Riley Lam MD Signature Date/Time: 03/10/2023/12:04:19 PM    Final    DG Chest Portable 1 View Result Date: 03/08/2023 CLINICAL DATA:  Cough, shortness of breath EXAM: PORTABLE CHEST 1 VIEW COMPARISON:  01/13/2021 FINDINGS: Cardiomegaly, vascular congestion. Diffuse interstitial prominence throughout the lungs. Bilateral lower lobe airspace opacities, favor edema. No definite effusions. No pneumothorax. No acute bony abnormality. IMPRESSION: Cardiomegaly, mild pulmonary edema. Electronically Signed   By: Charlett Nose M.D.   On: 03/08/2023 18:04      Scheduled Meds:  apixaban  5 mg Oral BID   diltiazem  300 mg Oral Daily   furosemide  40 mg Intravenous Daily   losartan  100 mg Oral Daily   And   hydrochlorothiazide  25 mg Oral Daily   levothyroxine  125 mcg Oral QAC breakfast   mirabegron ER  25 mg Oral Daily   potassium chloride  20 mEq Oral Daily   rosuvastatin  20 mg Oral Daily   Continuous Infusions:   LOS: 0 days    Time spent: 35 minutes   Sharlene Dory, DO [facility] 03/10/2023, 1:36 PM   Available via Epic secure chat 7am-7pm After these hours, please refer to coverage provider listed on [email or web address]

## 2023-03-11 ENCOUNTER — Other Ambulatory Visit (HOSPITAL_COMMUNITY): Payer: Self-pay

## 2023-03-11 ENCOUNTER — Telehealth (HOSPITAL_COMMUNITY): Payer: Self-pay | Admitting: Pharmacy Technician

## 2023-03-11 DIAGNOSIS — J069 Acute upper respiratory infection, unspecified: Secondary | ICD-10-CM

## 2023-03-11 DIAGNOSIS — I420 Dilated cardiomyopathy: Secondary | ICD-10-CM

## 2023-03-11 DIAGNOSIS — I4891 Unspecified atrial fibrillation: Secondary | ICD-10-CM | POA: Diagnosis not present

## 2023-03-11 DIAGNOSIS — R0602 Shortness of breath: Secondary | ICD-10-CM

## 2023-03-11 DIAGNOSIS — I5021 Acute systolic (congestive) heart failure: Secondary | ICD-10-CM | POA: Diagnosis not present

## 2023-03-11 LAB — CBC
HCT: 38.1 % (ref 36.0–46.0)
Hemoglobin: 12.1 g/dL (ref 12.0–15.0)
MCH: 30 pg (ref 26.0–34.0)
MCHC: 31.8 g/dL (ref 30.0–36.0)
MCV: 94.5 fL (ref 80.0–100.0)
Platelets: 179 10*3/uL (ref 150–400)
RBC: 4.03 MIL/uL (ref 3.87–5.11)
RDW: 15.3 % (ref 11.5–15.5)
WBC: 6.2 10*3/uL (ref 4.0–10.5)
nRBC: 0 % (ref 0.0–0.2)

## 2023-03-11 LAB — BASIC METABOLIC PANEL
Anion gap: 11 (ref 5–15)
BUN: 20 mg/dL (ref 8–23)
CO2: 26 mmol/L (ref 22–32)
Calcium: 8.9 mg/dL (ref 8.9–10.3)
Chloride: 101 mmol/L (ref 98–111)
Creatinine, Ser: 0.62 mg/dL (ref 0.44–1.00)
GFR, Estimated: >60 mL/min (ref >60)
Glucose, Bld: 109 mg/dL — ABNORMAL HIGH (ref 70–99)
Potassium: 3.4 mmol/L — ABNORMAL LOW (ref 3.5–5.1)
Sodium: 138 mmol/L (ref 135–145)

## 2023-03-11 LAB — T3, FREE: T3, Free: 2.5 pg/mL (ref 2.0–4.4)

## 2023-03-11 MED ORDER — METOPROLOL SUCCINATE ER 25 MG PO TB24
25.0000 mg | ORAL_TABLET | Freq: Every day | ORAL | Status: DC
  Administered 2023-03-11: 25 mg via ORAL
  Filled 2023-03-11: qty 1

## 2023-03-11 MED ORDER — FUROSEMIDE 10 MG/ML IJ SOLN
40.0000 mg | Freq: Two times a day (BID) | INTRAMUSCULAR | Status: DC
  Administered 2023-03-11 – 2023-03-13 (×5): 40 mg via INTRAVENOUS
  Filled 2023-03-11 (×5): qty 4

## 2023-03-11 MED ORDER — POTASSIUM CHLORIDE CRYS ER 20 MEQ PO TBCR
40.0000 meq | EXTENDED_RELEASE_TABLET | Freq: Two times a day (BID) | ORAL | Status: AC
  Administered 2023-03-11 (×2): 40 meq via ORAL
  Filled 2023-03-11 (×2): qty 2

## 2023-03-11 MED ORDER — LOSARTAN POTASSIUM 50 MG PO TABS
50.0000 mg | ORAL_TABLET | Freq: Every day | ORAL | Status: DC
Start: 2023-03-11 — End: 2023-03-14
  Administered 2023-03-11 – 2023-03-13 (×3): 50 mg via ORAL
  Filled 2023-03-11 (×3): qty 1

## 2023-03-11 MED ORDER — EMPAGLIFLOZIN 10 MG PO TABS
10.0000 mg | ORAL_TABLET | Freq: Every day | ORAL | Status: DC
  Administered 2023-03-11 – 2023-03-15 (×5): 10 mg via ORAL
  Filled 2023-03-11 (×5): qty 1

## 2023-03-11 MED ORDER — GUAIFENESIN ER 600 MG PO TB12
600.0000 mg | ORAL_TABLET | Freq: Two times a day (BID) | ORAL | Status: DC
  Administered 2023-03-11 – 2023-03-15 (×7): 600 mg via ORAL
  Filled 2023-03-11 (×7): qty 1

## 2023-03-11 NOTE — Progress Notes (Signed)
Heart Failure Nurse Navigator Progress Note  PCP: Hyacinth Meeker, [place] E, PA PCP-Cardiologist: None Admission Diagnosis: Atrial fibrillation with RVR Admitted from: Urgent Care to [facility] via EMS  Presentation:   Cassandra Ho presented with unresolved URI, complaints of increasing shortness of breath, found to be in atrial fibrillation 9 Rates 130-150's). BP 154/141, Temp 99.3, BMI 44.39, CXR showed cardiomegaly and mild pulmonary edema, started on Cardizem, potential plan for a TEE/DCCV.   Patient and her husband were educated on the sign and symptoms of heart failure, daily weights, when to call her doctor or go to the ED, Diet/ fluid restrictions, taking all medications as prescribed and attending all medical appointments, patient and her husband verbalized their understanding of education, a HF TOC appointment was scheduled for 03/22/2023 @ 3 pm.   ECHO/ LVEF: 30%  Clinical Course:  Past Medical History:  Diagnosis Date   Allergic rhinitis    Arthritis    generalized-ankles and knees   BCC (basal cell carcinoma of skin)    Constipation    Dyslipidemia    Edema of both lower extremities    GERD (gastroesophageal reflux disease)    H/O: pituitary tumor    "irradicated" no problems now   Headache    migraines in past   High cholesterol    Hypertension    Hypothyroidism    Hypothyroidism (acquired)    Post-ablation   Joint pain    Morbid obesity (HCC)    OSA (obstructive sleep apnea)    intolerant to CPAP and now using an oral device   Osteoarthritis    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 with 31.4 mCi of I-131 on 12/05/2007   Transfusion history    with hip surgery     Social History   Socioeconomic History   Marital status: Married    Spouse name: Jake Shark   Number of children: Not on file   Years of education: Not on file   Highest education level: Not on file  Occupational History   Occupation: Retired  Tobacco Use   Smoking status:  Former    Types: Cigarettes   Smokeless tobacco: Never   Tobacco comments:    Quit '78  Vaping Use   Vaping status: Never Used  Substance and Sexual Activity   Alcohol use: Yes    Comment: ocass   Drug use: No   Sexual activity: Not Currently    Partners: Male    Birth control/protection: Abstinence  Other Topics Concern   Not on file  Social History Narrative   ** Merged History Encounter **       Social Drivers of Health   Financial Resource Strain: Not on file  Food Insecurity: No Food Insecurity (03/10/2023)   Hunger Vital Sign    Worried About Running Out of Food in the Last Year: Never true    Ran Out of Food in the Last Year: Never true  Transportation Needs: No Transportation Needs (03/10/2023)   PRAPARE - Administrator, Civil Service (Medical): No    Lack of Transportation (Non-Medical): No  Physical Activity: Not on file  Stress: Not on file  Social Connections: Unknown (03/10/2023)   Social Connection and Isolation Panel [NHANES]    Frequency of Communication with Friends and Family: More than three times a week    Frequency of Social Gatherings with Friends and Family: More than three times a week    Attends Religious Services: Patient declined  Active Member of Clubs or Organizations: Patient declined    Attends Banker Meetings: Patient declined    Marital Status: Patient declined   Education Assessment and Provision:  Detailed education and instructions provided on heart failure disease management including the following:  Signs and symptoms of Heart Failure When to call the physician Importance of daily weights Low sodium diet Fluid restriction Medication management Anticipated future follow-up appointments  Patient education given on each of the above topics.  Patient acknowledges understanding via teach back method and acceptance of all instructions.  Education Materials:  "Living Better With Heart Failure" Booklet, HF  zone tool, & Daily Weight Tracker Tool.  Patient has scale at home: yes Patient has pill box at home: NA    High Risk Criteria for Readmission and/or Poor Patient Outcomes: Heart failure hospital admissions (last 6 months): 0  No Show rate: 3 % Difficult social situation: No, lives with her husband.  Demonstrates medication adherence: yes Primary Language: English Literacy level: Reading, writing, and comprehension  Barriers of Care:   Diet/ fluid restrictions Daily weights  Considerations/Referrals:   Referral made to Heart Failure Pharmacist Stewardship: yes Referral made to Heart Failure CSW/NCM TOC: No Referral made to Heart & Vascular TOC clinic: YES, 03/22/2023 @ 3 pm  Items for Follow-up on DC/TOC: Continued HF education Diet/ fluid restrictions/ daily weights   Rhae Hammock, BSN, RN Heart Failure Teacher, adult education Only

## 2023-03-11 NOTE — Telephone Encounter (Signed)
Patient Product/process development scientist completed.    The patient is insured through [facility]. Patient has Medicare and is not eligible for a copay card, but may be able to apply for patient assistance or Medicare RX Payment Plan (Patient Must reach out to their plan, if eligible for payment plan), if available.    Ran test claim for Eliquis 5 mg and the current 30 day co-pay is $467.00 due to a $420.00 deductible.  Will be $47.00 once deductible is met.   This test claim was processed through [facility]- copay amounts may vary at other pharmacies due to pharmacy/plan contracts, or as the patient moves through the different stages of their insurance plan.     Roland Earl, CPHT Pharmacy Technician III Certified Patient Advocate [facility] Patient Advocate Team Direct Number: [phone]  Fax: [phone]

## 2023-03-11 NOTE — Progress Notes (Addendum)
   Heart Failure Stewardship Pharmacist Progress Note   PCP: Collene Mares, PA PCP-Cardiologist: None    HPI:  78 yo F with PMH of HTN, HLD, and hypothyroidism.  Presented to the ED on 1/17 with shortness of breath. Reported to urgent care that she had been having URI symptoms for almost a month. She was found to be in afib RVR and was sent to the ED for further evaluation. She was started on cardizem gtt. CXR with cardiomegaly and mild pulmonary edema. ECHO on 1/19 showed LVEF 30%, global hypokinesis, RV normal, mild MR. Cardiology was then consulted. Cardizem stopped with new low EF and was transitioned to BB.   States that her shortness of breath is improving, but still has a cough. LE edema improved as well. Still wearing compression stockings. Pending decision for TEE/DCCV.   Current HF Medications: Diuretic: furosemide 40 mg IV BID Beta Blocker: metoprolol XL 25 mg daily ACE/ARB/ARNI: losartan 50 mg daily  Prior to admission HF Medications: ACE/ARB/ARNI: losartan/hydrochlorothiazide 100/25 mg daily  Pertinent Lab Values: Serum creatinine 0.62, BUN 20, Potassium 3.4, Sodium 138, Magnesium 1.9   Vital Signs: Weight: 275 lbs (admission weight: 275 lbs) Blood pressure: 110-130/70s  Heart rate: 90s  I/O: incomplete  Medication Assistance / Insurance Benefits Check: Does the patient have prescription insurance?  Yes Type of insurance plan: [facility] Medicare  Outpatient Pharmacy:  Prior to admission outpatient [facility] Is the patient willing to use [facility] TOC pharmacy at discharge? Yes Is the patient willing to transition their outpatient pharmacy to utilize a [facility] outpatient pharmacy?   Pending    Assessment: 1. 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 NYHA class III symptoms. - Continue furosemide 40 mg IV daily. Strict I/Os and daily weights. Keep K>4 and Mg>2. KCl 40 mEq x 2 ordered for replacement. - Agree with starting  metoprolol XL 25 mg daily and stopping diltiazem - Losartan decreased to 50 mg daily per primary. May be able to transition to Entresto 24/26 mg BID tomorrow if BP/renal function is stable. - Consider starting SGLT2i once decision on cardioversion is made. - Discontinue hydrochlorothiazide on discharge   Plan: 1) Medication changes recommended at this time: - Agree with changes - Stop hydrochlorothiazide on discharge - Recheck magnesium in AM  2) Patient assistance: - Unmet deductible $590 - Entresto copay [phone] until deductible met, then reduces to $47 per month - Farxiga copay $467 until deductible met, then reduces to $47 per month - Patient states this is affordable for her  3)  Education  - Initial education completed  - Full education to be completed prior to discharge  Sharen Hones, PharmD, BCPS Heart Failure Engineer, building services Phone [phone]

## 2023-03-11 NOTE — Progress Notes (Addendum)
PROGRESS NOTE    Cassandra Ho  WUJ:[id number] DOB: 07/21/1945 DOA: 03/08/2023 PCP: Collene Mares, PA  78 y.o. female with a hx of HTN, HLD, recent URI, GERD, Pituitary tumor, acquired hypothyroid, OSA, morbid obesity.  -Had URI symptoms for few weeks with started improving, subsequently developed dyspnea on exertion, went to urgent care was diagnosed with A-fib RVR, sent to the ED, started on IV Cardizem and then p.o. -Echo noted EF down to 30% -Dr. Hollie Beach notes from the weekend reviewed  Subjective: -Feels better overall, breathing is improving  Assessment and Plan:  Atrial fibrillation with RVR (HCC) -Treated with IV Cardizem the PO this admission, will change to Toprol in the setting of cardiomyopathy -Continue Eliquis -Anticipate need for TEE/cardioversion, cardiology is consulting -Needs repeat sleep study  New onset of congestive heart failure (HCC) Likely related to a.fib RVR. -Change Cardizem to Toprol -Add Jardiance -Continue losartan, could change to [facility] soon -Cardiology following  Iatrogenic hyperthyroidism -TSH was normal, free T4 mildly elevated, Synthroid dose decreased  Essential hypertension, benign -Continue losartan, dose decreased  Morbid obesity Suspected OSA -Recommended weight loss and repeat eval for sleep apnea  Hypokalemia Replaced   DVT prophylaxis: Eliquis Code Status: Full code Family Communication: Spouse at bedside Disposition Plan: Home pending above workup  Consultants:    Procedures:   Antimicrobials:    Objective: Vitals:   03/10/23 1935 03/11/23 0045 03/11/23 0450 03/11/23 0802  BP: 98/81 108/66 131/74 (!) 119/94  Pulse: 87 92 94 85  Resp: 19 13 17    Temp: 98.2 F (36.8 C) 97.8 F (36.6 C) 97.8 F (36.6 C) 97.9 F (36.6 C)  TempSrc: Oral Oral Oral Oral  SpO2: 92% 93% 94% 91%  Weight:      Height:        Intake/Output Summary (Last 24 hours) at 03/11/2023 1110 Last data filed at 03/11/2023  0600 Gross per 24 hour  Intake 480 ml  Output --  Net 480 ml   Filed Weights   03/08/23 1644  Weight: 124.7 kg    Examination:  General exam: Obese pleasant female sitting up in bed, AAOx3 HEENT: Neck obese unable to assess JVD CVS: S1-S2, irregular rhythm Lungs: Distant breath sounds otherwise decreased Abdomen: Soft, nontender, bowel sounds present Extremities: Trace edema  Psychiatry:  Mood & affect appropriate.     Data Reviewed:   CBC: Recent Labs  Lab 03/08/23 1655 03/09/23 0611 03/10/23 0259 03/11/23 0324  WBC 8.8 6.6 6.7 6.2  NEUTROABS 5.9  --   --   --   HGB 13.7 12.7 12.3 12.1  HCT 42.6 39.6 37.9 38.1  MCV 94.7 94.3 92.2 94.5  PLT 224 185 188 179   Basic Metabolic Panel: Recent Labs  Lab 03/08/23 1655 03/09/23 0611 03/10/23 0259 03/11/23 0324  NA 142 140 139 138  K 3.9 3.3* 3.7 3.4*  CL 107 104 103 101  CO2 23 26 27 26   GLUCOSE 107* 100* 101* 109*  BUN 24* 22 19 20   CREATININE 0.74 0.61 0.63 0.62  CALCIUM 8.9 8.9 9.0 8.9  MG 1.9  --   --   --    GFR: Estimated Creatinine Clearance: 80.8 mL/min (by C-G formula based on SCr of 0.62 mg/dL). Liver Function Tests: Recent Labs  Lab 03/08/23 1655  AST 17  ALT 11  ALKPHOS 59  BILITOT 0.6  PROT 6.3*  ALBUMIN 3.4*   No results for input(s): "LIPASE", "AMYLASE" in the last 168 hours. No results for input(s): "  AMMONIA" in the last 168 hours. Coagulation Profile: No results for input(s): "INR", "PROTIME" in the last 168 hours. Cardiac Enzymes: No results for input(s): "CKTOTAL", "CKMB", "CKMBINDEX", "TROPONINI" in the last 168 hours. BNP (last 3 results) No results for input(s): "PROBNP" in the last 8760 hours. HbA1C: No results for input(s): "HGBA1C" in the last 72 hours. CBG: No results for input(s): "GLUCAP" in the last 168 hours. Lipid Profile: No results for input(s): "CHOL", "HDL", "LDLCALC", "TRIG", "CHOLHDL", "LDLDIRECT" in the last 72 hours. Thyroid Function Tests: Recent Labs     03/08/23 1655 03/08/23 1700  TSH 2.296  --   FREET4 1.24*  --   T3FREE  --  2.5   Anemia Panel: No results for input(s): "VITAMINB12", "FOLATE", "FERRITIN", "TIBC", "IRON", "RETICCTPCT" in the last 72 hours. Urine analysis:    Component Value Date/Time   BILIRUBINUR negative 01/07/2023 1411   PROTEINUR Negative 01/07/2023 1411   UROBILINOGEN negative (A) 01/07/2023 1411   NITRITE negative 01/07/2023 1411   LEUKOCYTESUR Small (1+) (A) 01/07/2023 1411   Sepsis Labs: @LABRCNTIP (procalcitonin:4,lacticidven:4)  ) Recent Results (from the past 240 hours)  Resp panel by RT-PCR (RSV, Flu A&B, Covid) Anterior Nasal Swab     Status: None   Collection Time: 03/08/23  5:00 PM   Specimen: Anterior Nasal Swab  Result Value Ref Range Status   SARS Coronavirus 2 by RT PCR NEGATIVE NEGATIVE Final   Influenza A by PCR NEGATIVE NEGATIVE Final   Influenza B by PCR NEGATIVE NEGATIVE Final    Comment: (NOTE) The Xpert Xpress SARS-CoV-2/FLU/RSV plus assay is intended as an aid in the diagnosis of influenza from Nasopharyngeal swab specimens and should not be used as a sole basis for treatment. Nasal washings and aspirates are unacceptable for Xpert Xpress SARS-CoV-2/FLU/RSV testing.  Fact Sheet for Patients: BloggerCourse.com  Fact Sheet for Healthcare Providers: SeriousBroker.it  This test is not yet approved or cleared by the [place] FDA and has been authorized for detection and/or diagnosis of SARS-CoV-2 by FDA under an Emergency Use Authorization (EUA). This EUA will remain in effect (meaning this test can be used) for the duration of the COVID-19 declaration under Section 564(b)(1) of the Act, 21 U.S.C. section 360bbb-3(b)(1), unless the authorization is terminated or revoked.     Resp Syncytial Virus by PCR NEGATIVE NEGATIVE Final    Comment: (NOTE) Fact Sheet for  Patients: BloggerCourse.com  Fact Sheet for Healthcare Providers: SeriousBroker.it  This test is not yet approved or cleared by the [place] FDA and has been authorized for detection and/or diagnosis of SARS-CoV-2 by FDA under an Emergency Use Authorization (EUA). This EUA will remain in effect (meaning this test can be used) for the duration of the COVID-19 declaration under Section 564(b)(1) of the Act, 21 U.S.C. section 360bbb-3(b)(1), unless the authorization is terminated or revoked.  Performed at [facility], [address] [place], [place] [address]      Radiology Studies: DG CHEST PORT 1 VIEW Result Date: 03/10/2023 CLINICAL DATA:  Acute hypoxemic respiratory failure. EXAM: PORTABLE CHEST 1 VIEW COMPARISON:  Chest radiograph dated 03/08/2023. FINDINGS: The heart is enlarged. There is mild-to-moderate bilateral lower lung predominant atelectasis/airspace disease. A small right pleural effusion may contribute. No pneumothorax. Degenerative changes are seen in the spine. IMPRESSION: Mild-to-moderate bilateral lower lung predominant atelectasis/airspace disease. A small right pleural effusion may contribute. Electronically Signed   By: Romona Curls M.D.   On: 03/10/2023 13:12   ECHOCARDIOGRAM COMPLETE Result Date: 03/10/2023  ECHOCARDIOGRAM REPORT   Patient Name:   Cassandra Ho Date of Exam: 03/10/2023 Medical Rec #:  [id number]           Height:       66.0 in Accession #:    [id number]          Weight:       275.0 lb Date of Birth:  Oct 28, 1945           BSA:          2.290 m Patient Age:    77 years            BP:           123/99 mmHg Patient Gender: F                   HR:           108 bpm. Exam Location:  Inpatient Procedure: 2D Echo, Cardiac Doppler, Color Doppler and Intracardiac            Opacification Agent Indications:    Atrial Fibrillation I48.91  History:        Patient has no prior history of  Echocardiogram examinations.                 CHF, Arrythmias:Atrial Fibrillation; Risk Factors:Hypertension                 and Sleep Apnea.  Sonographer:    [name] RCS Referring Phys: [id number] Jilda Roche [facility]  Sonographer Comments: Patient is obese. IMPRESSIONS  1. Left ventricular ejection fraction, by estimation, is 30%. The left ventricle has moderately decreased function. The left ventricle demonstrates global hypokinesis. Left ventricular diastolic function could not be evaluated.  2. Right ventricular systolic function is normal. The right ventricular size is normal.  3. The mitral valve was not well visualized. Mild mitral valve regurgitation. No evidence of mitral stenosis.  4. The aortic valve was not well visualized. Aortic valve regurgitation is not visualized.  5. The inferior vena cava is dilated in size with >50% respiratory variability, suggesting right atrial pressure of 8 mmHg. Comparison(s): No prior Echocardiogram. FINDINGS  Left Ventricle: Left ventricular ejection fraction, by estimation, is 30%. The left ventricle has moderately decreased function. The left ventricle demonstrates global hypokinesis. Definity contrast agent was given IV to delineate the left ventricular endocardial borders. The left ventricular internal cavity size was normal in size. Suboptimal image quality limits for assessment of left ventricular hypertrophy. Left ventricular diastolic function could not be evaluated due to atrial fibrillation. Left  ventricular diastolic function could not be evaluated. Right Ventricle: The right ventricular size is normal. No increase in right ventricular wall thickness. Right ventricular systolic function is normal. Left Atrium: Left atrial size was normal in size. Right Atrium: Right atrial size was normal in size. Pericardium: Trivial pericardial effusion is present. The pericardial effusion is surrounding the apex. Presence of epicardial fat layer. Mitral Valve: The  mitral valve was not well visualized. Mild mitral valve regurgitation. No evidence of mitral valve stenosis. Tricuspid Valve: The tricuspid valve is not well visualized. Tricuspid valve regurgitation is trivial. No evidence of tricuspid stenosis. Aortic Valve: The aortic valve was not well visualized. Aortic valve regurgitation is not visualized. Aortic valve peak gradient measures 12.1 mmHg. Pulmonic Valve: The pulmonic valve was not well visualized. Pulmonic valve regurgitation is not visualized. No evidence of pulmonic stenosis. Aorta: The aortic root and ascending aorta are structurally normal, with no evidence  of dilitation. Venous: The inferior vena cava is dilated in size with greater than 50% respiratory variability, suggesting right atrial pressure of 8 mmHg. IAS/Shunts: The atrial septum is grossly normal.  LEFT VENTRICLE PLAX 2D LVOT diam:     2.10 cm   Diastology LV SV:         48        LV e' medial:    8.59 cm/s LV SV Index:   21        LV E/e' medial:  13.7 LVOT Area:     3.46 cm  LV e' lateral:   11.05 cm/s                          LV E/e' lateral: 10.7  RIGHT VENTRICLE             IVC RV S prime:     12.90 cm/s  IVC diam: 2.70 cm TAPSE (M-mode): 1.7 cm LEFT ATRIUM           Index        RIGHT ATRIUM           Index LA Vol (A4C): 63.2 ml 27.60 ml/m  RA Area:     19.95 cm                                    RA Volume:   56.75 ml  24.79 ml/m  AORTIC VALVE AV Area (Vmax): 1.86 cm AV Vmax:        174.00 cm/s AV Peak Grad:   12.1 mmHg LVOT Vmax:      93.40 cm/s LVOT Vmean:     66.400 cm/s LVOT VTI:       0.138 m  AORTA Ao Root diam: 3.60 cm Ao Asc diam:  3.10 cm MITRAL VALVE MV Area (PHT): 4.08 cm     SHUNTS MV Decel Time: 186 msec     Systemic VTI:  0.14 m MR Peak grad: 78.9 mmHg     Systemic Diam: 2.10 cm MR Vmax:      444.00 cm/s MV E velocity: 118.00 cm/s Riley Lam MD Electronically signed by Riley Lam MD Signature Date/Time: 03/10/2023/12:04:19 PM    Final      Scheduled  Meds:  apixaban  5 mg Oral BID   furosemide  40 mg Intravenous BID   levothyroxine  125 mcg Oral QAC breakfast   losartan  50 mg Oral Daily   metoprolol succinate  25 mg Oral Daily   mirabegron ER  25 mg Oral Daily   potassium chloride  40 mEq Oral BID   rosuvastatin  20 mg Oral Daily   Continuous Infusions:   LOS: 1 day    Time spent:    Zannie Cove, MD [facility]   03/11/2023, 11:10 AM

## 2023-03-12 ENCOUNTER — Encounter (HOSPITAL_COMMUNITY): Payer: Self-pay | Admitting: Family Medicine

## 2023-03-12 DIAGNOSIS — I5021 Acute systolic (congestive) heart failure: Secondary | ICD-10-CM | POA: Diagnosis not present

## 2023-03-12 DIAGNOSIS — I420 Dilated cardiomyopathy: Secondary | ICD-10-CM | POA: Diagnosis not present

## 2023-03-12 DIAGNOSIS — I4891 Unspecified atrial fibrillation: Secondary | ICD-10-CM | POA: Diagnosis not present

## 2023-03-12 LAB — BASIC METABOLIC PANEL
Anion gap: 11 (ref 5–15)
BUN: 22 mg/dL (ref 8–23)
CO2: 26 mmol/L (ref 22–32)
Calcium: 9.4 mg/dL (ref 8.9–10.3)
Chloride: 104 mmol/L (ref 98–111)
Creatinine, Ser: 0.68 mg/dL (ref 0.44–1.00)
GFR, Estimated: >60 mL/min (ref >60)
Glucose, Bld: 109 mg/dL — ABNORMAL HIGH (ref 70–99)
Potassium: 4.5 mmol/L (ref 3.5–5.1)
Sodium: 141 mmol/L (ref 135–145)

## 2023-03-12 LAB — MAGNESIUM: Magnesium: 1.9 mg/dL (ref 1.7–2.4)

## 2023-03-12 MED ORDER — METOPROLOL SUCCINATE ER 50 MG PO TB24
50.0000 mg | ORAL_TABLET | Freq: Every day | ORAL | Status: DC
Start: 2023-03-12 — End: 2023-03-13
  Administered 2023-03-12 – 2023-03-13 (×2): 50 mg via ORAL
  Filled 2023-03-12 (×2): qty 1

## 2023-03-12 MED ORDER — TRAMADOL HCL 50 MG PO TABS
50.0000 mg | ORAL_TABLET | Freq: Two times a day (BID) | ORAL | Status: DC | PRN
  Administered 2023-03-12 – 2023-03-13 (×2): 50 mg via ORAL
  Filled 2023-03-12 (×2): qty 1

## 2023-03-12 NOTE — Progress Notes (Addendum)
PROGRESS NOTE    ADDELYNNE Ho  EXB:[id number] DOB: Apr 24, 1945 DOA: 03/08/2023 PCP: Collene Mares, PA  78 y.o. female with a hx of HTN, HLD, recent URI, GERD, Pituitary tumor, acquired hypothyroid, OSA, morbid obesity.  -Had URI symptoms for few weeks with started improving, subsequently developed dyspnea on exertion, went to urgent care was diagnosed with A-fib RVR, sent to the ED, started on IV Cardizem and then p.o. -Echo noted EF down to 30% -Dr. Hollie Beach notes from the weekend reviewed  Subjective: -Some shortness of breath, heart rate racing most of the day yesterday  Assessment and Plan:  Atrial fibrillation with RVR (HCC) -Treated with IV Cardizem the PO this admission, -Now on Toprol, will increase dose to 50 Mg -Continue Eliquis -Anticipate need for TEE/cardioversion, cardiology following, inpatient versus outpatient -Needs repeat sleep study  New onset of congestive heart failure (HCC) Likely related to a.fib RVR. -Echo this admission with a EF 30%, global hypokinesis, normal RV -Continue IV Lasix 1 more day -Now on Toprol> dose increased, started on Jardiance yesterday -Continue losartan, changed to Entresto at discharge -Starting low-dose Aldactone today  Iatrogenic hyperthyroidism -TSH was normal, free T4 mildly elevated, Synthroid dose decreased  Essential hypertension, benign -Continue losartan, dose decreased  Morbid obesity Suspected OSA -Recommended weight loss and repeat eval for sleep apnea  Hypokalemia Replaced   DVT prophylaxis: Eliquis Code Status: Full code Family Communication: Spouse at bedside Disposition Plan: Home pending above workup  Consultants:    Procedures:   Antimicrobials:    Objective: Vitals:   03/12/23 0200 03/12/23 0300 03/12/23 0316 03/12/23 0750  BP:   (!) 139/90 (!) 118/96  Pulse: 100 (!) 102 98 100  Resp:   20 15  Temp:   97.7 F (36.5 C) 97.7 F (36.5 C)  TempSrc:   Oral Oral  SpO2:   93%  95%  Weight:   124.5 kg   Height:        Intake/Output Summary (Last 24 hours) at 03/12/2023 1012 Last data filed at 03/11/2023 2200 Gross per 24 hour  Intake 440 ml  Output --  Net 440 ml   Filed Weights   03/08/23 1644 03/12/23 0316  Weight: 124.7 kg 124.5 kg    Examination:  General exam: Obese pleasant female sitting up in bed, AAOx3 HEENT: Neck obese unable to assess JVD CVS: S1-S2, irregular rhythm, tachycardic Lungs: Distant breath sounds Abdomen: Soft, nontender, bowel sounds present Extremities: Trace edema   Psychiatry:  Mood & affect appropriate.     Data Reviewed:   CBC: Recent Labs  Lab 03/08/23 1655 03/09/23 0611 03/10/23 0259 03/11/23 0324  WBC 8.8 6.6 6.7 6.2  NEUTROABS 5.9  --   --   --   HGB 13.7 12.7 12.3 12.1  HCT 42.6 39.6 37.9 38.1  MCV 94.7 94.3 92.2 94.5  PLT 224 185 188 179   Basic Metabolic Panel: Recent Labs  Lab 03/08/23 1655 03/09/23 0611 03/10/23 0259 03/11/23 0324 03/12/23 0309  NA 142 140 139 138 141  K 3.9 3.3* 3.7 3.4* 4.5  CL 107 104 103 101 104  CO2 23 26 27 26 26   GLUCOSE 107* 100* 101* 109* 109*  BUN 24* 22 19 20 22   CREATININE 0.74 0.61 0.63 0.62 0.68  CALCIUM 8.9 8.9 9.0 8.9 9.4  MG 1.9  --   --   --  1.9   GFR: Estimated Creatinine Clearance: 80.7 mL/min (by C-G formula based on SCr of 0.68 mg/dL). Liver Function  Tests: Recent Labs  Lab 03/08/23 1655  AST 17  ALT 11  ALKPHOS 59  BILITOT 0.6  PROT 6.3*  ALBUMIN 3.4*   No results for input(s): "LIPASE", "AMYLASE" in the last 168 hours. No results for input(s): "AMMONIA" in the last 168 hours. Coagulation Profile: No results for input(s): "INR", "PROTIME" in the last 168 hours. Cardiac Enzymes: No results for input(s): "CKTOTAL", "CKMB", "CKMBINDEX", "TROPONINI" in the last 168 hours. BNP (last 3 results) No results for input(s): "PROBNP" in the last 8760 hours. HbA1C: No results for input(s): "HGBA1C" in the last 72 hours. CBG: No results for  input(s): "GLUCAP" in the last 168 hours. Lipid Profile: No results for input(s): "CHOL", "HDL", "LDLCALC", "TRIG", "CHOLHDL", "LDLDIRECT" in the last 72 hours. Thyroid Function Tests: No results for input(s): "TSH", "T4TOTAL", "FREET4", "T3FREE", "THYROIDAB" in the last 72 hours.  Anemia Panel: No results for input(s): "VITAMINB12", "FOLATE", "FERRITIN", "TIBC", "IRON", "RETICCTPCT" in the last 72 hours. Urine analysis:    Component Value Date/Time   BILIRUBINUR negative 01/07/2023 1411   PROTEINUR Negative 01/07/2023 1411   UROBILINOGEN negative (A) 01/07/2023 1411   NITRITE negative 01/07/2023 1411   LEUKOCYTESUR Small (1+) (A) 01/07/2023 1411   Sepsis Labs: @LABRCNTIP (procalcitonin:4,lacticidven:4)  ) Recent Results (from the past 240 hours)  Resp panel by RT-PCR (RSV, Flu A&B, Covid) Anterior Nasal Swab     Status: None   Collection Time: 03/08/23  5:00 PM   Specimen: Anterior Nasal Swab  Result Value Ref Range Status   SARS Coronavirus 2 by RT PCR NEGATIVE NEGATIVE Final   Influenza A by PCR NEGATIVE NEGATIVE Final   Influenza B by PCR NEGATIVE NEGATIVE Final    Comment: (NOTE) The Xpert Xpress SARS-CoV-2/FLU/RSV plus assay is intended as an aid in the diagnosis of influenza from Nasopharyngeal swab specimens and should not be used as a sole basis for treatment. Nasal washings and aspirates are unacceptable for Xpert Xpress SARS-CoV-2/FLU/RSV testing.  Fact Sheet for Patients: BloggerCourse.com  Fact Sheet for Healthcare Providers: SeriousBroker.it  This test is not yet approved or cleared by the [place] FDA and has been authorized for detection and/or diagnosis of SARS-CoV-2 by FDA under an Emergency Use Authorization (EUA). This EUA will remain in effect (meaning this test can be used) for the duration of the COVID-19 declaration under Section 564(b)(1) of the Act, 21 U.S.C. section 360bbb-3(b)(1), unless  the authorization is terminated or revoked.     Resp Syncytial Virus by PCR NEGATIVE NEGATIVE Final    Comment: (NOTE) Fact Sheet for Patients: BloggerCourse.com  Fact Sheet for Healthcare Providers: SeriousBroker.it  This test is not yet approved or cleared by the [place] FDA and has been authorized for detection and/or diagnosis of SARS-CoV-2 by FDA under an Emergency Use Authorization (EUA). This EUA will remain in effect (meaning this test can be used) for the duration of the COVID-19 declaration under Section 564(b)(1) of the Act, 21 U.S.C. section 360bbb-3(b)(1), unless the authorization is terminated or revoked.  Performed at [facility], [address], [place], [place] [address]      Radiology Studies: ECHOCARDIOGRAM COMPLETE Result Date: 03/10/2023    ECHOCARDIOGRAM REPORT   Patient Name:   Cassandra Ho Date of Exam: 03/10/2023 Medical Rec #:  [id number]           Height:       66.0 in Accession #:    [id number]          Weight:  275.0 lb Date of Birth:  [birth year]           BSA:          2.290 m Patient Age:    64 years            BP:           123/99 mmHg Patient Gender: F                   HR:           108 bpm. Exam Location:  Inpatient Procedure: 2D Echo, Cardiac Doppler, Color Doppler and Intracardiac            Opacification Agent Indications:    Atrial Fibrillation I48.91  History:        Patient has no prior history of Echocardiogram examinations.                 CHF, Arrythmias:Atrial Fibrillation; Risk Factors:Hypertension                 and Sleep Apnea.  Sonographer:    [name] RCS Referring Phys: [id number] Jilda Roche [facility]  Sonographer Comments: Patient is obese. IMPRESSIONS  1. Left ventricular ejection fraction, by estimation, is 30%. The left ventricle has moderately decreased function. The left ventricle demonstrates global hypokinesis. Left ventricular diastolic function could  not be evaluated.  2. Right ventricular systolic function is normal. The right ventricular size is normal.  3. The mitral valve was not well visualized. Mild mitral valve regurgitation. No evidence of mitral stenosis.  4. The aortic valve was not well visualized. Aortic valve regurgitation is not visualized.  5. The inferior vena cava is dilated in size with >50% respiratory variability, suggesting right atrial pressure of 8 mmHg. Comparison(s): No prior Echocardiogram. FINDINGS  Left Ventricle: Left ventricular ejection fraction, by estimation, is 30%. The left ventricle has moderately decreased function. The left ventricle demonstrates global hypokinesis. Definity contrast agent was given IV to delineate the left ventricular endocardial borders. The left ventricular internal cavity size was normal in size. Suboptimal image quality limits for assessment of left ventricular hypertrophy. Left ventricular diastolic function could not be evaluated due to atrial fibrillation. Left  ventricular diastolic function could not be evaluated. Right Ventricle: The right ventricular size is normal. No increase in right ventricular wall thickness. Right ventricular systolic function is normal. Left Atrium: Left atrial size was normal in size. Right Atrium: Right atrial size was normal in size. Pericardium: Trivial pericardial effusion is present. The pericardial effusion is surrounding the apex. Presence of epicardial fat layer. Mitral Valve: The mitral valve was not well visualized. Mild mitral valve regurgitation. No evidence of mitral valve stenosis. Tricuspid Valve: The tricuspid valve is not well visualized. Tricuspid valve regurgitation is trivial. No evidence of tricuspid stenosis. Aortic Valve: The aortic valve was not well visualized. Aortic valve regurgitation is not visualized. Aortic valve peak gradient measures 12.1 mmHg. Pulmonic Valve: The pulmonic valve was not well visualized. Pulmonic valve regurgitation is not  visualized. No evidence of pulmonic stenosis. Aorta: The aortic root and ascending aorta are structurally normal, with no evidence of dilitation. Venous: The inferior vena cava is dilated in size with greater than 50% respiratory variability, suggesting right atrial pressure of 8 mmHg. IAS/Shunts: The atrial septum is grossly normal.  LEFT VENTRICLE PLAX 2D LVOT diam:     2.10 cm   Diastology LV SV:         48  LV e' medial:    8.59 cm/s LV SV Index:   21        LV E/e' medial:  13.7 LVOT Area:     3.46 cm  LV e' lateral:   11.05 cm/s                          LV E/e' lateral: 10.7  RIGHT VENTRICLE             IVC RV S prime:     12.90 cm/s  IVC diam: 2.70 cm TAPSE (M-mode): 1.7 cm LEFT ATRIUM           Index        RIGHT ATRIUM           Index LA Vol (A4C): 63.2 ml 27.60 ml/m  RA Area:     19.95 cm                                    RA Volume:   56.75 ml  24.79 ml/m  AORTIC VALVE AV Area (Vmax): 1.86 cm AV Vmax:        174.00 cm/s AV Peak Grad:   12.1 mmHg LVOT Vmax:      93.40 cm/s LVOT Vmean:     66.400 cm/s LVOT VTI:       0.138 m  AORTA Ao Root diam: 3.60 cm Ao Asc diam:  3.10 cm MITRAL VALVE MV Area (PHT): 4.08 cm     SHUNTS MV Decel Time: 186 msec     Systemic VTI:  0.14 m MR Peak grad: 78.9 mmHg     Systemic Diam: 2.10 cm MR Vmax:      444.00 cm/s MV E velocity: 118.00 cm/s Riley Lam MD Electronically signed by Riley Lam MD Signature Date/Time: 03/10/2023/12:04:19 PM    Final      Scheduled Meds:  apixaban  5 mg Oral BID   empagliflozin  10 mg Oral Daily   furosemide  40 mg Intravenous BID   guaiFENesin  600 mg Oral BID   levothyroxine  125 mcg Oral QAC breakfast   losartan  50 mg Oral Daily   metoprolol succinate  50 mg Oral Daily   mirabegron ER  25 mg Oral Daily   rosuvastatin  20 mg Oral Daily   Continuous Infusions:   LOS: 2 days    Time spent:    Zannie Cove, MD [facility]   03/12/2023, 10:12 AM

## 2023-03-12 NOTE — Progress Notes (Addendum)
Progress Note  Patient Name: Cassandra Ho Date of Encounter: 03/12/2023  [facility] Cardiologist: None   Patient Profile     Subjective   Denies any CP or SOB.  Has been unaware of atrial fibrillation but last night felt palpitations when HR was up in the 130's  Inpatient Medications    Scheduled Meds:  apixaban  5 mg Oral BID   empagliflozin  10 mg Oral Daily   furosemide  40 mg Intravenous BID   guaiFENesin  600 mg Oral BID   levothyroxine  125 mcg Oral QAC breakfast   losartan  50 mg Oral Daily   metoprolol succinate  25 mg Oral Daily   mirabegron ER  25 mg Oral Daily   rosuvastatin  20 mg Oral Daily   Continuous Infusions:  PRN Meds: acetaminophen, famotidine, loratadine, ondansetron (ZOFRAN) IV   Vital Signs    Vitals:   03/11/23 2246 03/12/23 0200 03/12/23 0300 03/12/23 0316  BP: 117/82   (!) 139/90  Pulse: 67 100 (!) 102 98  Resp: 17   20  Temp: 98.3 F (36.8 C)   97.7 F (36.5 C)  TempSrc: Oral   Oral  SpO2: 93%   93%  Weight:    124.5 kg  Height:        Intake/Output Summary (Last 24 hours) at 03/12/2023 0719 Last data filed at 03/11/2023 2200 Gross per 24 hour  Intake 440 ml  Output --  Net 440 ml      03/12/2023    3:16 AM 03/08/2023    4:44 PM 01/07/2023    1:46 PM  Last 3 Weights  Weight (lbs) 274 lb 7.6 oz 275 lb 298 lb  Weight (kg) 124.5 kg 124.739 kg 135.172 kg      Telemetry    Atrial fibrillation with RVR- Personally Reviewed  ECG    No new EKG to review - Personally Reviewed  Physical Exam   GEN: No acute distress.   Neck: No JVD Cardiac: irregularly irregular and tachy, no murmurs, rubs, or gallops.  Respiratory: Clear to auscultation bilaterally. GI: Soft, nontender, non-distended  MS: trace LE  edema; No deformity. Neuro:  Nonfocal  Psych: Normal affect   Labs    High Sensitivity Troponin:  No results for input(s): "TROPONINIHS" in the last 720 hours.    Chemistry Recent Labs  Lab  03/08/23 1655 03/09/23 0611 03/10/23 0259 03/11/23 0324 03/12/23 0309  NA 142   < > 139 138 141  K 3.9   < > 3.7 3.4* 4.5  CL 107   < > 103 101 104  CO2 23   < > 27 26 26   GLUCOSE 107*   < > 101* 109* 109*  BUN 24*   < > 19 20 22   CREATININE 0.74   < > 0.63 0.62 0.68  CALCIUM 8.9   < > 9.0 8.9 9.4  PROT 6.3*  --   --   --   --   ALBUMIN 3.4*  --   --   --   --   AST 17  --   --   --   --   ALT 11  --   --   --   --   ALKPHOS 59  --   --   --   --   BILITOT 0.6  --   --   --   --   GFRNONAA >60   < > >60 >60 >60  ANIONGAP 12   < > 9 11 11    < > = values in this interval not displayed.     Hematology Recent Labs  Lab 03/09/23 0611 03/10/23 0259 03/11/23 0324  WBC 6.6 6.7 6.2  RBC 4.20 4.11 4.03  HGB 12.7 12.3 12.1  HCT 39.6 37.9 38.1  MCV 94.3 92.2 94.5  MCH 30.2 29.9 30.0  MCHC 32.1 32.5 31.8  RDW 15.0 15.1 15.3  PLT 185 188 179    BNPNo results for input(s): "BNP", "PROBNP" in the last 168 hours.   DDimer No results for input(s): "DDIMER" in the last 168 hours.   CHA2DS2-VASc Score = 5   This indicates a 7.2% annual risk of stroke. The patient's score is based upon: CHF History: 1 HTN History: 1 Diabetes History: 0 Stroke History: 0 Vascular Disease History: 0 Age Score: 2 Gender Score: 1      Radiology    DG CHEST PORT 1 VIEW Result Date: 03/10/2023 CLINICAL DATA:  Acute hypoxemic respiratory failure. EXAM: PORTABLE CHEST 1 VIEW COMPARISON:  Chest radiograph dated 03/08/2023. FINDINGS: The heart is enlarged. There is mild-to-moderate bilateral lower lung predominant atelectasis/airspace disease. A small right pleural effusion may contribute. No pneumothorax. Degenerative changes are seen in the spine. IMPRESSION: Mild-to-moderate bilateral lower lung predominant atelectasis/airspace disease. A small right pleural effusion may contribute. Electronically Signed   By: Romona Curls M.D.   On: 03/10/2023 13:12   ECHOCARDIOGRAM COMPLETE Result Date:  03/10/2023    ECHOCARDIOGRAM REPORT   Patient Name:   Cassandra Ho Date of Exam: 03/10/2023 Medical Rec #:  [id number]           Height:       66.0 in Accession #:    [id number]          Weight:       275.0 lb Date of Birth:  Jul 03, 1945           BSA:          2.290 m Patient Age:    77 years            BP:           123/99 mmHg Patient Gender: F                   HR:           108 bpm. Exam Location:  Inpatient Procedure: 2D Echo, Cardiac Doppler, Color Doppler and Intracardiac            Opacification Agent Indications:    Atrial Fibrillation I48.91  History:        Patient has no prior history of Echocardiogram examinations.                 CHF, Arrythmias:Atrial Fibrillation; Risk Factors:Hypertension                 and Sleep Apnea.  Sonographer:    [name] RCS Referring Phys: [id number] Jilda Roche [facility]  Sonographer Comments: Patient is obese. IMPRESSIONS  1. Left ventricular ejection fraction, by estimation, is 30%. The left ventricle has moderately decreased function. The left ventricle demonstrates global hypokinesis. Left ventricular diastolic function could not be evaluated.  2. Right ventricular systolic function is normal. The right ventricular size is normal.  3. The mitral valve was not well visualized. Mild mitral valve regurgitation. No evidence of mitral stenosis.  4. The aortic valve was not well visualized. Aortic valve regurgitation is not visualized.  5. The inferior vena cava is dilated in size with >50% respiratory variability, suggesting right atrial pressure of 8 mmHg. Comparison(s): No prior Echocardiogram. FINDINGS  Left Ventricle: Left ventricular ejection fraction, by estimation, is 30%. The left ventricle has moderately decreased function. The left ventricle demonstrates global hypokinesis. Definity contrast agent was given IV to delineate the left ventricular endocardial borders. The left ventricular internal cavity size was normal in size. Suboptimal image quality  limits for assessment of left ventricular hypertrophy. Left ventricular diastolic function could not be evaluated due to atrial fibrillation. Left  ventricular diastolic function could not be evaluated. Right Ventricle: The right ventricular size is normal. No increase in right ventricular wall thickness. Right ventricular systolic function is normal. Left Atrium: Left atrial size was normal in size. Right Atrium: Right atrial size was normal in size. Pericardium: Trivial pericardial effusion is present. The pericardial effusion is surrounding the apex. Presence of epicardial fat layer. Mitral Valve: The mitral valve was not well visualized. Mild mitral valve regurgitation. No evidence of mitral valve stenosis. Tricuspid Valve: The tricuspid valve is not well visualized. Tricuspid valve regurgitation is trivial. No evidence of tricuspid stenosis. Aortic Valve: The aortic valve was not well visualized. Aortic valve regurgitation is not visualized. Aortic valve peak gradient measures 12.1 mmHg. Pulmonic Valve: The pulmonic valve was not well visualized. Pulmonic valve regurgitation is not visualized. No evidence of pulmonic stenosis. Aorta: The aortic root and ascending aorta are structurally normal, with no evidence of dilitation. Venous: The inferior vena cava is dilated in size with greater than 50% respiratory variability, suggesting right atrial pressure of 8 mmHg. IAS/Shunts: The atrial septum is grossly normal.  LEFT VENTRICLE PLAX 2D LVOT diam:     2.10 cm   Diastology LV SV:         48        LV e' medial:    8.59 cm/s LV SV Index:   21        LV E/e' medial:  13.7 LVOT Area:     3.46 cm  LV e' lateral:   11.05 cm/s                          LV E/e' lateral: 10.7  RIGHT VENTRICLE             IVC RV S prime:     12.90 cm/s  IVC diam: 2.70 cm TAPSE (M-mode): 1.7 cm LEFT ATRIUM           Index        RIGHT ATRIUM           Index LA Vol (A4C): 63.2 ml 27.60 ml/m  RA Area:     19.95 cm                                     RA Volume:   56.75 ml  24.79 ml/m  AORTIC VALVE AV Area (Vmax): 1.86 cm AV Vmax:        174.00 cm/s AV Peak Grad:   12.1 mmHg LVOT Vmax:      93.40 cm/s LVOT Vmean:     66.400 cm/s LVOT VTI:       0.138 m  AORTA Ao Root diam: 3.60 cm Ao Asc diam:  3.10 cm MITRAL VALVE MV Area (PHT): 4.08 cm     SHUNTS MV Decel Time: 186  msec     Systemic VTI:  0.14 m MR Peak grad: 78.9 mmHg     Systemic Diam: 2.10 cm MR Vmax:      444.00 cm/s MV E velocity: 118.00 cm/s Riley Lam MD Electronically signed by Riley Lam MD Signature Date/Time: 03/10/2023/12:04:19 PM    Final     Patient Profile     78 y.o. female with a hx of HTN, HLD, recent URI, GERD, Pituitary tumor, acquired hypothyroid, OSA, morbid obesity.  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  HFrEF, new diagnosis with EF 30% -suspect tachy mediated from afib with RVR but could also be viral in origing after her URI -PTA hydrochlorothiazide stopped and now on Lasix 40mg  IV BID -I&O's are incomplete -weight down 1 lbs from yesterday -SCr stable at 0.68 and K+ 4.5 -continue IV Lasix 1 more day and then change to PO -continue Toprol and increase for HR control  -continue Farxiga 10mg  daily, Losartan 50mg  daily with plans to transition to [facility] at discharge -add Cleda Daub 12.5mg  daily   3. Induced  hypothyroid - pta on Synthroid 150 mcg/day - free T3 and TSH ok but free T4 elevated so dose reduced to 125 mcg/day - per IM   Other issues, per IM    I spent 30 minutes caring for this patient today face to face, ordering and reviewing labs, reviewing records from 2d echo and hospital notes this admission, seeing the patient, documenting in the record and adjusting meds.     For questions or updates, please contact [facility] Please consult [email or web address] for contact info under        Signed, Armanda Magic, MD  03/12/2023, 7:19 AM

## 2023-03-12 NOTE — Progress Notes (Signed)
   Heart Failure Stewardship Pharmacist Progress Note   PCP: Collene Mares, PA PCP-Cardiologist: None    HPI:  78 yo F with PMH of HTN, HLD, and hypothyroidism.  Presented to the ED on 1/17 with shortness of breath. Reported to urgent care that she had been having URI symptoms for almost a month. She was found to be in afib RVR and was sent to the ED for further evaluation. She was started on cardizem gtt. CXR with cardiomegaly and mild pulmonary edema. ECHO on 1/19 showed LVEF 30%, global hypokinesis, RV normal, mild MR. Cardiology was then consulted. Cardizem stopped with new low EF and was transitioned to BB.   May need TEE/DCCV this admission pending HR control. As of today, HR creeping up to the 110s. She thinks her pain and anxiety yesterday as well as lack of sleep may be contributing. Denies shortness of breath. Trace LE edema on exam.   Current HF Medications: Diuretic: furosemide 40 mg IV BID Beta Blocker: metoprolol XL 25 mg daily ACE/ARB/ARNI: losartan 50 mg daily SGLT2i: Jardiance 10 mg daily  Prior to admission HF Medications: ACE/ARB/ARNI: losartan/hydrochlorothiazide 100/25 mg daily  Pertinent Lab Values: Serum creatinine 0.68, BUN 22, Potassium 4.5, Sodium 141, Magnesium 1.9   Vital Signs: Weight: 274 lbs (admission weight: 275 lbs) Blood pressure: 110-130/90s  Heart rate: 90-120s  I/O: incomplete  Medication Assistance / Insurance Benefits Check: Does the patient have prescription insurance?  Yes Type of insurance plan: [facility] Medicare  Outpatient Pharmacy:  [facility] Is the patient willing to use [facility] TOC pharmacy at discharge? Yes Is the patient willing to transition their outpatient pharmacy to utilize a [facility] outpatient pharmacy?   No    Assessment: 1. 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 Pending plans for TEE/DCCV. NYHA class III symptoms. - Continue furosemide 40 mg IV  daily, close to transitioning to PO. Strict I/Os and daily weights. Keep K>4 and Mg>2.  - On metoprolol XL 25 mg daily - consider increasing to 50 mg daily with tachycardia - Continue losartan 50 mg daily. May be able to transition to Entresto 24/26 mg BID prior to discharge - Consider adding spironolactone 12.5 mg daily today - Continue Jardiance 10 mg daily - Discontinue hydrochlorothiazide on discharge   Plan: 1) Medication changes recommended at this time: - Increase metoprolol XL to 50 mg daily - Add spironolactone 12.5 mg daily  2) Patient assistance: - Unmet deductible $590 - Entresto copay [phone] until deductible met, then reduces to $47 per month - Farxiga copay $467 until deductible met, then reduces to $47 per month - Patient states this is affordable for her  3)  Education  - Initial education completed  - Full education to be completed prior to discharge  Sharen Hones, PharmD, BCPS Heart Failure Engineer, building services Phone [phone]

## 2023-03-13 DIAGNOSIS — I4891 Unspecified atrial fibrillation: Secondary | ICD-10-CM | POA: Diagnosis not present

## 2023-03-13 DIAGNOSIS — I5022 Chronic systolic (congestive) heart failure: Secondary | ICD-10-CM | POA: Diagnosis not present

## 2023-03-13 DIAGNOSIS — E039 Hypothyroidism, unspecified: Secondary | ICD-10-CM | POA: Insufficient documentation

## 2023-03-13 DIAGNOSIS — E0580 Other thyrotoxicosis without thyrotoxic crisis or storm: Secondary | ICD-10-CM | POA: Diagnosis not present

## 2023-03-13 DIAGNOSIS — E876 Hypokalemia: Secondary | ICD-10-CM

## 2023-03-13 DIAGNOSIS — I509 Heart failure, unspecified: Secondary | ICD-10-CM | POA: Diagnosis not present

## 2023-03-13 DIAGNOSIS — E785 Hyperlipidemia, unspecified: Secondary | ICD-10-CM

## 2023-03-13 DIAGNOSIS — I5032 Chronic diastolic (congestive) heart failure: Secondary | ICD-10-CM | POA: Insufficient documentation

## 2023-03-13 DIAGNOSIS — N3946 Mixed incontinence: Secondary | ICD-10-CM

## 2023-03-13 DIAGNOSIS — E66813 Obesity, class 3: Secondary | ICD-10-CM

## 2023-03-13 DIAGNOSIS — I10 Essential (primary) hypertension: Secondary | ICD-10-CM | POA: Diagnosis not present

## 2023-03-13 LAB — BASIC METABOLIC PANEL
Anion gap: 10 (ref 5–15)
BUN: 22 mg/dL (ref 8–23)
CO2: 26 mmol/L (ref 22–32)
Calcium: 8.9 mg/dL (ref 8.9–10.3)
Chloride: 98 mmol/L (ref 98–111)
Creatinine, Ser: 0.76 mg/dL (ref 0.44–1.00)
GFR, Estimated: >60 mL/min (ref >60)
Glucose, Bld: 95 mg/dL (ref 70–99)
Potassium: 3.6 mmol/L (ref 3.5–5.1)
Sodium: 134 mmol/L — ABNORMAL LOW (ref 135–145)

## 2023-03-13 MED ORDER — POTASSIUM CHLORIDE CRYS ER 20 MEQ PO TBCR
40.0000 meq | EXTENDED_RELEASE_TABLET | ORAL | Status: AC
  Administered 2023-03-13 (×2): 40 meq via ORAL
  Filled 2023-03-13 (×2): qty 2

## 2023-03-13 MED ORDER — SODIUM CHLORIDE 0.9% FLUSH
3.0000 mL | INTRAVENOUS | Status: DC | PRN
Start: 2023-03-13 — End: 2023-03-14

## 2023-03-13 MED ORDER — METOPROLOL SUCCINATE ER 50 MG PO TB24: 75.0000 mg | ORAL_TABLET | Freq: Every day | ORAL | Status: DC

## 2023-03-13 MED ORDER — SODIUM CHLORIDE 0.9% FLUSH
3.0000 mL | Freq: Two times a day (BID) | INTRAVENOUS | Status: DC
  Administered 2023-03-13 – 2023-03-14 (×2): 10 mL via INTRAVENOUS

## 2023-03-13 MED ORDER — METOPROLOL SUCCINATE ER 25 MG PO TB24
25.0000 mg | ORAL_TABLET | Freq: Once | ORAL | Status: AC
  Administered 2023-03-13: 25 mg via ORAL
  Filled 2023-03-13: qty 1

## 2023-03-13 NOTE — Progress Notes (Signed)
  Progress Note   Patient: Cassandra Ho QMV:[id number] DOB: 01-Oct-1945 DOA: 03/08/2023     3 DOS: the patient was seen and examined on 03/13/2023   Brief hospital course: 78 y.o. female with a hx of HTN, HLD, recent URI, GERD, Pituitary tumor, acquired hypothyroid, OSA, morbid obesity.  -Had URI symptoms for few weeks with started improving, subsequently developed dyspnea on exertion, went to urgent care was diagnosed with A-fib RVR, sent to the ED, started on IV Cardizem and then p.o. -Echo noted EF down to 30% -Dr. Hollie Beach notes from the weekend reviewed  Assessment and Plan: * Atrial fibrillation with RVR (HCC) Patient continue atrial fibrillation with rate 110's   Plan to continue AV blockade with metoprolol and direct current cardioversion in am.  Continue telemetry monitoring.  Anticoagulation with apixaban.   New onset of congestive heart failure (HCC) Echocardiogram with reduced LV systolic function to 30%, with global hypokinesis with normal RV.   Volume status has improved with diuresis. Continue furosemide 40 mg IV bid Continue with empagliflozin and losartan.   Iatrogenic hyperthyroidism Pt slightly HYPERthyroid today with T4 of 1.24.  Presumably due to her home synthroid dose of being slightly too much for her. Reducing synthroid dose to 125 mcg daily.  Essential hypertension, benign Continue blood pressure control with losartan and metoprolol succinate.   Dyslipidemia Continue with statin therapy.   Urinary incontinence, mixed Continue mirabregon.   Obesity, class 3 Calculated BMI is 47.1  Hypokalemia Hyponatremia.   Renal function with serum cr at 0,76 with K at 3,6 and serum bicarbonate at 26  Na 134   Plan to continue K correction with Kcl, follow up renal function and electrolytes in am.         Subjective: Patient is feeling better, continue to have dyspnea on exertion, not back to baseline, edema has improved.   Physical  Exam: Vitals:   03/13/23 0836 03/13/23 1210 03/13/23 1253 03/13/23 1633  BP: 125/79 102/78 94/68 104/65  Pulse: 73 (!) 153 (!) 107 (!) 112  Resp: 14 14 18    Temp: 97.9 F (36.6 C) 98.2 F (36.8 C) 98.2 F (36.8 C) 98.2 F (36.8 C)  TempSrc: Oral Oral Oral Oral  SpO2: 93% 91% 91% 91%  Weight:      Height:       Neurology awake and alert ENT with mild pallor Cardiovascular with S1 and S2 present, irregularly irregular with no gallops, rubs or murmurs.  Respiratory with mid rales with no wheezing or rhonchi Abdomen with no distention  Trace lower extremity edema  Data Reviewed:    Family Communication: I spoke with patient's husband at the bedside, we talked in detail about patient's condition, plan of care and prognosis and all questions were addressed.   Disposition: Status is: Inpatient Remains inpatient appropriate because: IV diuresis, direct current cardioversion   Planned Discharge Destination: Home      Author: Coralie Keens, MD 03/13/2023 6:01 PM  For on call review [email or web address].

## 2023-03-13 NOTE — Assessment & Plan Note (Signed)
Calculated BMI is 47.1

## 2023-03-13 NOTE — Progress Notes (Signed)
   Heart Failure Stewardship Pharmacist Progress Note   PCP: Collene Mares, PA PCP-Cardiologist: None    HPI:  78 yo F with PMH of HTN, HLD, and hypothyroidism.  Presented to the ED on 1/17 with shortness of breath. Reported to urgent care that she had been having URI symptoms for almost a month. She was found to be in afib RVR and was sent to the ED for further evaluation. She was started on cardizem gtt. CXR with cardiomegaly and mild pulmonary edema. ECHO on 1/19 showed LVEF 30%, global hypokinesis, RV normal, mild MR. Cardiology was then consulted. Cardizem stopped with new low EF and was transitioned to BB.   HR remains uncontrolled on higher dose of metoprolol. Planning TEE/DCCV this admission, scheduled for 1/23. Denies shortness of breath. Trace LE edema on exam with compression socks. I/Os are not being collected but the patient reports she is still peeing a lot with light colored urine.   Current HF Medications: Diuretic: furosemide 40 mg IV BID Beta Blocker: metoprolol XL 75 mg daily ACE/ARB/ARNI: losartan 50 mg daily SGLT2i: Jardiance 10 mg daily  Prior to admission HF Medications: ACE/ARB/ARNI: losartan/hydrochlorothiazide 100/25 mg daily  Pertinent Lab Values: Serum creatinine 0.76, BUN 22, Potassium 3.6, Sodium 134, Magnesium 1.9   Vital Signs: Weight: 292 lbs (admission weight: 275 lbs) Blood pressure: 120-130/70s  Heart rate: 90-120s  I/O: incomplete  Medication Assistance / Insurance Benefits Check: Does the patient have prescription insurance?  Yes Type of insurance plan: [facility] Medicare  Outpatient Pharmacy:  [facility] Is the patient willing to use [facility] TOC pharmacy at discharge? Yes Is the patient willing to transition their outpatient pharmacy to utilize a [facility] outpatient pharmacy?   No    Assessment: 1. 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 Pending TEE/DCCV 1/23. NYHA  class III symptoms. - Continue furosemide 40 mg IV daily, close to transitioning to PO. Strict I/Os and daily weights. Keep K>4 and Mg>2.  - Metoprolol increased to 75 mg daily with afib RVR, pending TEE/DCCV 1/23  - Continue losartan 50 mg daily. May be able to transition to Entresto 24/26 mg BID prior to discharge - Consider adding spironolactone 12.5 mg daily today - Continue Jardiance 10 mg daily - Discontinue hydrochlorothiazide on discharge   Plan: 1) Medication changes recommended at this time: - Add spironolactone 12.5 mg daily tomorrow after TEE/DCCV  2) Patient assistance: - Unmet deductible $590 - Entresto copay [phone] until deductible met, then reduces to $47 per month - Farxiga copay $467 until deductible met, then reduces to $47 per month - Patient states this is affordable for her  3)  Education  - Initial education completed  - Full education to be completed prior to discharge  Sharen Hones, PharmD, BCPS Heart Failure Engineer, building services Phone [phone]

## 2023-03-13 NOTE — H&P (View-Only) (Signed)
Progress Note  Patient Name: Cassandra Ho Date of Encounter: 03/13/2023  [facility] Cardiologist: None   Patient Profile     Subjective   Denies any CP or SOB.  Remains in afib with RVR up in the 130's at times  Inpatient Medications    Scheduled Meds:  apixaban  5 mg Oral BID   empagliflozin  10 mg Oral Daily   furosemide  40 mg Intravenous BID   guaiFENesin  600 mg Oral BID   levothyroxine  125 mcg Oral QAC breakfast   losartan  50 mg Oral Daily   metoprolol succinate  50 mg Oral Daily   mirabegron ER  25 mg Oral Daily   rosuvastatin  20 mg Oral Daily   Continuous Infusions:  PRN Meds: acetaminophen, famotidine, loratadine, ondansetron (ZOFRAN) IV, traMADol   Vital Signs    Vitals:   03/12/23 1937 03/12/23 2356 03/13/23 0445 03/13/23 0836  BP: 119/70 118/72 (!) 132/92 125/79  Pulse: (!) 114 99 86 73  Resp: 15 20 16 14   Temp: 98.1 F (36.7 C) 98.5 F (36.9 C) 97.6 F (36.4 C) 97.9 F (36.6 C)  TempSrc: Oral Oral Oral Oral  SpO2: 94% 94% 95% 93%  Weight:   132.5 kg   Height:        Intake/Output Summary (Last 24 hours) at 03/13/2023 0920 Last data filed at 03/13/2023 0600 Gross per 24 hour  Intake 300 ml  Output --  Net 300 ml      03/13/2023    4:45 AM 03/12/2023    3:16 AM 03/08/2023    4:44 PM  Last 3 Weights  Weight (lbs) 292 lb 1.6 oz 274 lb 7.6 oz 275 lb  Weight (kg) 132.496 kg 124.5 kg 124.739 kg      Telemetry    Atrial fibrillation with RVR - Personally Reviewed  ECG    No new EKG to review - Personally Reviewed  Physical Exam  GEN: Well nourished, well developed in no acute distress HEENT: Normal NECK: No JVD; No carotid bruits LYMPHATICS: No lymphadenopathy CARDIAC:irregularly irregular, no murmurs, rubs, gallops RESPIRATORY:  Clear to auscultation without rales, wheezing or rhonchi  ABDOMEN: Soft, non-tender, non-distended MUSCULOSKELETAL:  No edema; No deformity  SKIN: Warm and dry NEUROLOGIC:  Alert and  oriented x 3 PSYCHIATRIC:  Normal affect   Labs    High Sensitivity Troponin:  No results for input(s): "TROPONINIHS" in the last 720 hours.    Chemistry Recent Labs  Lab 03/08/23 1655 03/09/23 0611 03/11/23 0324 03/12/23 0309 03/13/23 0259  NA 142   < > 138 141 134*  K 3.9   < > 3.4* 4.5 3.6  CL 107   < > 101 104 98  CO2 23   < > 26 26 26   GLUCOSE 107*   < > 109* 109* 95  BUN 24*   < > 20 22 22   CREATININE 0.74   < > 0.62 0.68 0.76  CALCIUM 8.9   < > 8.9 9.4 8.9  PROT 6.3*  --   --   --   --   ALBUMIN 3.4*  --   --   --   --   AST 17  --   --   --   --   ALT 11  --   --   --   --   ALKPHOS 59  --   --   --   --   BILITOT 0.6  --   --   --   --  GFRNONAA >60   < > >60 >60 >60  ANIONGAP 12   < > 11 11 10    < > = values in this interval not displayed.     Hematology Recent Labs  Lab 03/09/23 0611 03/10/23 0259 03/11/23 0324  WBC 6.6 6.7 6.2  RBC 4.20 4.11 4.03  HGB 12.7 12.3 12.1  HCT 39.6 37.9 38.1  MCV 94.3 92.2 94.5  MCH 30.2 29.9 30.0  MCHC 32.1 32.5 31.8  RDW 15.0 15.1 15.3  PLT 185 188 179    BNPNo results for input(s): "BNP", "PROBNP" in the last 168 hours.   DDimer No results for input(s): "DDIMER" in the last 168 hours.   CHA2DS2-VASc Score = 5   This indicates a 7.2% annual risk of stroke. The patient's score is based upon: CHF History: 1 HTN History: 1 Diabetes History: 0 Stroke History: 0 Vascular Disease History: 0 Age Score: 2 Gender Score: 1      Radiology    No results found.   Patient Profile     78 y.o. female with a hx of HTN, HLD, recent URI, GERD, Pituitary tumor, acquired hypothyroid, OSA, morbid obesity.  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%  Assessment & Plan    Atrial fibrillation, RVR -presented with afib with RVR in setting of URI of unknown duration>>suspect it has been a few weeks given her sx and new DCM -no CCB due to DCM -HR  still fast going up into the 120-130's -continue Apixaban 5mg  BID -increase Toprol to 74m daily  -I think at this point we need to proceed with TEE/DCCV since we cannot get her rate controlled -Informed Consent   Shared Decision Making/Informed Consent   The risks [stroke, cardiac arrhythmias rarely resulting in the need for a temporary or permanent pacemaker, skin irritation or burns, esophageal damage, perforation (1:10,000 risk), bleeding, pharyngeal hematoma as well as other potential complications associated with conscious sedation including aspiration, arrhythmia, respiratory failure and death], benefits (treatment guidance, restoration of normal sinus rhythm, diagnostic support) and alternatives of a transesophageal echocardiogram guided cardioversion were discussed in detail with [name] and she is willing to proceed. -will make NPO after MN for TEE/DCCV in am   2.  HFrEF, new diagnosis with EF 30% -suspect tachy mediated from afib with RVR but could also be viral in origing after her URI -PTA hydrochlorothiazide stopped and now on Lasix 40mg  IV BID -I&O's are incomplete -weight does not appear inaccurate>>will ask to reweigh -SCr stable at 0.76 and K+ 3.6 -continue Farxiga 10mg  daily, spiro 12.5mg  daily and Losartan 50mg  daily -increase Toprol XL to 75mg  daily for better HR control -transition losartan to Entresto 24-26mg  BID after DCCV if BP stable   3. Induced hypothyroid - pta on Synthroid 150 mcg/day - free T3 and TSH ok but free T4 elevated so dose reduced to 125 mcg/day - per IM   Other issues, per IM    I spent 30 minutes caring for this patient today face to face, ordering and reviewing labs, reviewing records from 2d echo and hospital notes this admission, seeing the patient, documenting in the record and adjusting meds and setting up TEE/DCCV.     For questions or updates, please contact [facility] Please consult [email or web address] for contact info under         Signed, Armanda Magic, MD  03/13/2023, 9:20 AM

## 2023-03-13 NOTE — Progress Notes (Signed)
Progress Note  Patient Name: Cassandra Ho Date of Encounter: 03/13/2023  [facility] Cardiologist: None   Patient Profile     Subjective   Denies any CP or SOB.  Remains in afib with RVR up in the 130's at times  Inpatient Medications    Scheduled Meds:  apixaban  5 mg Oral BID   empagliflozin  10 mg Oral Daily   furosemide  40 mg Intravenous BID   guaiFENesin  600 mg Oral BID   levothyroxine  125 mcg Oral QAC breakfast   losartan  50 mg Oral Daily   metoprolol succinate  50 mg Oral Daily   mirabegron ER  25 mg Oral Daily   rosuvastatin  20 mg Oral Daily   Continuous Infusions:  PRN Meds: acetaminophen, famotidine, loratadine, ondansetron (ZOFRAN) IV, traMADol   Vital Signs    Vitals:   03/12/23 1937 03/12/23 2356 03/13/23 0445 03/13/23 0836  BP: 119/70 118/72 (!) 132/92 125/79  Pulse: (!) 114 99 86 73  Resp: 15 20 16 14   Temp: 98.1 F (36.7 C) 98.5 F (36.9 C) 97.6 F (36.4 C) 97.9 F (36.6 C)  TempSrc: Oral Oral Oral Oral  SpO2: 94% 94% 95% 93%  Weight:   132.5 kg   Height:        Intake/Output Summary (Last 24 hours) at 03/13/2023 0920 Last data filed at 03/13/2023 0600 Gross per 24 hour  Intake 300 ml  Output --  Net 300 ml      03/13/2023    4:45 AM 03/12/2023    3:16 AM 03/08/2023    4:44 PM  Last 3 Weights  Weight (lbs) 292 lb 1.6 oz 274 lb 7.6 oz 275 lb  Weight (kg) 132.496 kg 124.5 kg 124.739 kg      Telemetry    Atrial fibrillation with RVR - Personally Reviewed  ECG    No new EKG to review - Personally Reviewed  Physical Exam  GEN: Well nourished, well developed in no acute distress HEENT: Normal NECK: No JVD; No carotid bruits LYMPHATICS: No lymphadenopathy CARDIAC:irregularly irregular, no murmurs, rubs, gallops RESPIRATORY:  Clear to auscultation without rales, wheezing or rhonchi  ABDOMEN: Soft, non-tender, non-distended MUSCULOSKELETAL:  No edema; No deformity  SKIN: Warm and dry NEUROLOGIC:  Alert and  oriented x 3 PSYCHIATRIC:  Normal affect   Labs    High Sensitivity Troponin:  No results for input(s): "TROPONINIHS" in the last 720 hours.    Chemistry Recent Labs  Lab 03/08/23 1655 03/09/23 0611 03/11/23 0324 03/12/23 0309 03/13/23 0259  NA 142   < > 138 141 134*  K 3.9   < > 3.4* 4.5 3.6  CL 107   < > 101 104 98  CO2 23   < > 26 26 26   GLUCOSE 107*   < > 109* 109* 95  BUN 24*   < > 20 22 22   CREATININE 0.74   < > 0.62 0.68 0.76  CALCIUM 8.9   < > 8.9 9.4 8.9  PROT 6.3*  --   --   --   --   ALBUMIN 3.4*  --   --   --   --   AST 17  --   --   --   --   ALT 11  --   --   --   --   ALKPHOS 59  --   --   --   --   BILITOT 0.6  --   --   --   --  GFRNONAA >60   < > >60 >60 >60  ANIONGAP 12   < > 11 11 10    < > = values in this interval not displayed.     Hematology Recent Labs  Lab 03/09/23 0611 03/10/23 0259 03/11/23 0324  WBC 6.6 6.7 6.2  RBC 4.20 4.11 4.03  HGB 12.7 12.3 12.1  HCT 39.6 37.9 38.1  MCV 94.3 92.2 94.5  MCH 30.2 29.9 30.0  MCHC 32.1 32.5 31.8  RDW 15.0 15.1 15.3  PLT 185 188 179    BNPNo results for input(s): "BNP", "PROBNP" in the last 168 hours.   DDimer No results for input(s): "DDIMER" in the last 168 hours.   CHA2DS2-VASc Score = 5   This indicates a 7.2% annual risk of stroke. The patient's score is based upon: CHF History: 1 HTN History: 1 Diabetes History: 0 Stroke History: 0 Vascular Disease History: 0 Age Score: 2 Gender Score: 1      Radiology    No results found.   Patient Profile     78 y.o. female with a hx of HTN, HLD, recent URI, GERD, Pituitary tumor, acquired hypothyroid, OSA, morbid obesity.  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%  Assessment & Plan    Atrial fibrillation, RVR -presented with afib with RVR in setting of URI of unknown duration>>suspect it has been a few weeks given her sx and new DCM -no CCB due to DCM -HR  still fast going up into the 120-130's -continue Apixaban 5mg  BID -increase Toprol to 74m daily  -I think at this point we need to proceed with TEE/DCCV since we cannot get her rate controlled -Informed Consent   Shared Decision Making/Informed Consent   The risks [stroke, cardiac arrhythmias rarely resulting in the need for a temporary or permanent pacemaker, skin irritation or burns, esophageal damage, perforation (1:10,000 risk), bleeding, pharyngeal hematoma as well as other potential complications associated with conscious sedation including aspiration, arrhythmia, respiratory failure and death], benefits (treatment guidance, restoration of normal sinus rhythm, diagnostic support) and alternatives of a transesophageal echocardiogram guided cardioversion were discussed in detail with Cassandra Ho and she is willing to proceed. -will make NPO after MN for TEE/DCCV in am   2.  HFrEF, new diagnosis with EF 30% -suspect tachy mediated from afib with RVR but could also be viral in origing after her URI -PTA hydrochlorothiazide stopped and now on Lasix 40mg  IV BID -I&O's are incomplete -weight does not appear inaccurate>>will ask to reweigh -SCr stable at 0.76 and K+ 3.6 -continue Farxiga 10mg  daily, spiro 12.5mg  daily and Losartan 50mg  daily -increase Toprol XL to 75mg  daily for better HR control -transition losartan to Entresto 24-26mg  BID after DCCV if BP stable   3. Induced hypothyroid - pta on Synthroid 150 mcg/day - free T3 and TSH ok but free T4 elevated so dose reduced to 125 mcg/day - per IM   Other issues, per IM    I spent 30 minutes caring for this patient today face to face, ordering and reviewing labs, reviewing records from 2d echo and hospital notes this admission, seeing the patient, documenting in the record and adjusting meds and setting up TEE/DCCV.     For questions or updates, please contact [facility] Please consult [email or web address] for contact info under         Signed, Armanda Magic, MD  03/13/2023, 9:20 AM

## 2023-03-13 NOTE — Assessment & Plan Note (Signed)
Continue with statin therapy.  ?

## 2023-03-13 NOTE — Assessment & Plan Note (Signed)
Continue mirabregon.

## 2023-03-13 NOTE — Assessment & Plan Note (Addendum)
Hyponatremia. Hyperkalemia   Renal function has remained stable, with serum cr at 0,7 with K at 3,9 and serum bicarbonate at 24  Will add Kcl supplements to avoid hyokalemia.  Follow up renal function and electrolytes in 7 days as outpatient.

## 2023-03-13 NOTE — Hospital Course (Addendum)
[name] was admitted to the hospital with the working diagnosis of acute on chronic systolic heart failure in the setting of atrial fibrillation with RVR.   78 y.o. female with a hx of HTN, HLD, recent URI, GERD, Pituitary tumor, acquired hypothyroid, OSA, and obesity.  Patient had an upper respiratory infection about one month prior to admission, she had outpatient follow up on the day of admission and found in atrial fibrillation with rapid ventricular response, 150 bpm. She was referred to the ED.  On her initial physical examination her blood pressure was 147/95, HR 119, RR 18 and 02 saturation 91%, on IV diltiazem, heart with S1 and S2 present, irregularly irregular, with no gallops or rubs, respiratory with no wheezing or rhonchi, with mild rales, abdomen with no distention and positive lower extremity edema.   Na 142, K 3,9 Cl 107, bicarbonate 23, glucose 152m bun 24 and cr 0,74  Wbc 8,8 hgb 13,7 plt 224  TSH 2,29 T 4 free 1,24 T3 free 2,5  Sars covid 19 negative  Influenza negative   Chest radiograph with hypoinflation, cardiomegaly with bilateral hilar vascular congestion, bilateral central interstitial infiltrates more at lower lobes, small bilateral pleural effusions.   LV function noted to be reduced and diltiazem was discontinued.   Patient was placed on AV blockade with B blocker.  01/23 direct current cardioversion, converting to sinus rhythm.  Volume has improved.  01/24 patient continue sinus rhythm, and improved volume status. Plan for discharge home and follow up with Cardiology as outpatient.

## 2023-03-14 ENCOUNTER — Encounter (HOSPITAL_COMMUNITY)
Admission: EM | Disposition: A | Discharge status: Home / Self Care | Payer: Self-pay | Source: Home / Self Care | Attending: Internal Medicine

## 2023-03-14 ENCOUNTER — Inpatient Hospital Stay (HOSPITAL_COMMUNITY): Payer: Medicare Other

## 2023-03-14 ENCOUNTER — Encounter (HOSPITAL_COMMUNITY): Payer: Self-pay | Admitting: Family Medicine

## 2023-03-14 DIAGNOSIS — Z87891 Personal history of nicotine dependence: Secondary | ICD-10-CM | POA: Diagnosis not present

## 2023-03-14 DIAGNOSIS — I340 Nonrheumatic mitral (valve) insufficiency: Secondary | ICD-10-CM | POA: Diagnosis not present

## 2023-03-14 DIAGNOSIS — I110 Hypertensive heart disease with heart failure: Secondary | ICD-10-CM

## 2023-03-14 DIAGNOSIS — I361 Nonrheumatic tricuspid (valve) insufficiency: Secondary | ICD-10-CM | POA: Diagnosis not present

## 2023-03-14 DIAGNOSIS — I5023 Acute on chronic systolic (congestive) heart failure: Secondary | ICD-10-CM | POA: Diagnosis not present

## 2023-03-14 DIAGNOSIS — E0580 Other thyrotoxicosis without thyrotoxic crisis or storm: Secondary | ICD-10-CM | POA: Diagnosis not present

## 2023-03-14 DIAGNOSIS — I081 Rheumatic disorders of both mitral and tricuspid valves: Secondary | ICD-10-CM

## 2023-03-14 DIAGNOSIS — I5032 Chronic diastolic (congestive) heart failure: Secondary | ICD-10-CM | POA: Diagnosis not present

## 2023-03-14 DIAGNOSIS — I5021 Acute systolic (congestive) heart failure: Secondary | ICD-10-CM | POA: Diagnosis not present

## 2023-03-14 DIAGNOSIS — I420 Dilated cardiomyopathy: Secondary | ICD-10-CM | POA: Diagnosis not present

## 2023-03-14 DIAGNOSIS — I10 Essential (primary) hypertension: Secondary | ICD-10-CM | POA: Diagnosis not present

## 2023-03-14 DIAGNOSIS — I4891 Unspecified atrial fibrillation: Secondary | ICD-10-CM | POA: Diagnosis not present

## 2023-03-14 HISTORY — PX: TRANSESOPHAGEAL ECHOCARDIOGRAM (CATH LAB): EP1270

## 2023-03-14 HISTORY — PX: CARDIOVERSION: EP1203

## 2023-03-14 LAB — BASIC METABOLIC PANEL
Anion gap: 10 (ref 5–15)
BUN: 30 mg/dL — ABNORMAL HIGH (ref 8–23)
CO2: 27 mmol/L (ref 22–32)
Calcium: 9.2 mg/dL (ref 8.9–10.3)
Chloride: 99 mmol/L (ref 98–111)
Creatinine, Ser: 0.92 mg/dL (ref 0.44–1.00)
GFR, Estimated: >60 mL/min (ref >60)
Glucose, Bld: 107 mg/dL — ABNORMAL HIGH (ref 70–99)
Potassium: 5.2 mmol/L — ABNORMAL HIGH (ref 3.5–5.1)
Sodium: 136 mmol/L (ref 135–145)

## 2023-03-14 LAB — ECHO TEE
Est EF: 30
MV M vel: 4.16 m/s
MV Peak grad: 69.2 mm[Hg]
Radius: 0.5 cm

## 2023-03-14 LAB — MAGNESIUM: Magnesium: 2.1 mg/dL (ref 1.7–2.4)

## 2023-03-14 SURGERY — TRANSESOPHAGEAL ECHOCARDIOGRAM (TEE) (CATHLAB)
Anesthesia: Monitor Anesthesia Care

## 2023-03-14 MED ORDER — PROPOFOL 10 MG/ML IV BOLUS
INTRAVENOUS | Status: DC | PRN
  Administered 2023-03-14: 60 mg via INTRAVENOUS

## 2023-03-14 MED ORDER — FUROSEMIDE 40 MG PO TABS: 40.0000 mg | ORAL_TABLET | Freq: Every day | ORAL | Status: DC

## 2023-03-14 MED ORDER — SACUBITRIL-VALSARTAN 24-26 MG PO TABS
1.0000 | ORAL_TABLET | Freq: Two times a day (BID) | ORAL | Status: DC
  Administered 2023-03-15: 1 via ORAL
  Filled 2023-03-14: qty 1

## 2023-03-14 MED ORDER — METOPROLOL SUCCINATE ER 50 MG PO TB24: 50.0000 mg | ORAL_TABLET | Freq: Every day | ORAL | Status: DC

## 2023-03-14 MED ORDER — SODIUM CHLORIDE 0.9 % IV SOLN: INTRAVENOUS | Status: DC | PRN

## 2023-03-14 MED ORDER — FUROSEMIDE 40 MG PO TABS: 40.0000 mg | ORAL_TABLET | Freq: Two times a day (BID) | ORAL | Status: DC

## 2023-03-14 MED ORDER — PROPOFOL 500 MG/50ML IV EMUL
INTRAVENOUS | Status: DC | PRN
  Administered 2023-03-14: 60 ug/kg/min via INTRAVENOUS

## 2023-03-14 MED ORDER — LIDOCAINE 2% (20 MG/ML) 5 ML SYRINGE
INTRAMUSCULAR | Status: DC | PRN
  Administered 2023-03-14: 50 mg via INTRAVENOUS

## 2023-03-14 MED ORDER — METOPROLOL SUCCINATE ER 25 MG PO TB24
25.0000 mg | ORAL_TABLET | Freq: Every day | ORAL | Status: DC
  Administered 2023-03-15: 25 mg via ORAL
  Filled 2023-03-14: qty 1

## 2023-03-14 MED ORDER — DEXAMETHASONE SODIUM PHOSPHATE 10 MG/ML IJ SOLN
INTRAMUSCULAR | Status: DC | PRN
  Administered 2023-03-14: 5 mg via INTRAVENOUS

## 2023-03-14 MED ORDER — FUROSEMIDE 40 MG PO TABS
40.0000 mg | ORAL_TABLET | Freq: Every day | ORAL | Status: DC
  Administered 2023-03-15: 40 mg via ORAL
  Filled 2023-03-14: qty 1

## 2023-03-14 SURGICAL SUPPLY — 1 items: PAD DEFIB RADIO PHYSIO CONN (PAD) ×2 IMPLANT

## 2023-03-14 NOTE — Progress Notes (Signed)
Per Dr. Mayford Knife, recommend to get BMET at 1/31 HF TOC visit. Have arranged 4 week gen cards f/u and outlined on AVS. (She reports OK to see APP if no MD appt available.)

## 2023-03-14 NOTE — Progress Notes (Addendum)
Progress Note  Patient Name: Cassandra Ho Date of Encounter: 03/14/2023  [facility] Cardiologist: None   Patient Profile     Subjective   Patient just finished with TEE cardioversion.  No evidence of atrial and left atrial appendage thrombus and successful cardioversion to sinus rhythm.  Inpatient Medications    Scheduled Meds:  apixaban  5 mg Oral BID   empagliflozin  10 mg Oral Daily   furosemide  40 mg Intravenous BID   guaiFENesin  600 mg Oral BID   levothyroxine  125 mcg Oral QAC breakfast   losartan  50 mg Oral Daily   metoprolol succinate  75 mg Oral Daily   mirabegron ER  25 mg Oral Daily   rosuvastatin  20 mg Oral Daily   Continuous Infusions:  PRN Meds: acetaminophen, famotidine, loratadine, ondansetron (ZOFRAN) IV, traMADol   Vital Signs    Vitals:   03/14/23 0822 03/14/23 0830 03/14/23 0957 03/14/23 1125  BP: 113/71 (!) 114/102  119/77  Pulse: 98 (!) 142    Resp: 20 11  16   Temp: 99.8 F (37.7 C) 98.2 F (36.8 C) 98.1 F (36.7 C) 97.8 F (36.6 C)  TempSrc: Oral Temporal Temporal Oral  SpO2: 100% 92%  95%  Weight:      Height:        Intake/Output Summary (Last 24 hours) at 03/14/2023 1223 Last data filed at 03/14/2023 1610 Gross per 24 hour  Intake 260 ml  Output --  Net 260 ml      03/14/2023    4:09 AM 03/13/2023    4:45 AM 03/12/2023    3:16 AM  Last 3 Weights  Weight (lbs) 291 lb 3.2 oz 292 lb 1.6 oz 274 lb 7.6 oz  Weight (kg) 132.087 kg 132.496 kg 124.5 kg      Telemetry    Sinus rhythm- Personally Reviewed  ECG    No new EKG to review - Personally Reviewed  Physical Exam  GEN: Well nourished, well developed in no acute distress HEENT: Normal NECK: No JVD; No carotid bruits LYMPHATICS: No lymphadenopathy CARDIAC:RRR, no murmurs, rubs, gallops RESPIRATORY:  Clear to auscultation without rales, wheezing or rhonchi  ABDOMEN: Soft, non-tender, non-distended MUSCULOSKELETAL:  No edema; No deformity  SKIN: Warm  and dry NEUROLOGIC:  Alert and oriented x 3 PSYCHIATRIC:  Normal affect  Labs    High Sensitivity Troponin:  No results for input(s): "TROPONINIHS" in the last 720 hours.    Chemistry Recent Labs  Lab 03/08/23 1655 03/09/23 0611 03/12/23 0309 03/13/23 0259 03/14/23 0307  NA 142   < > 141 134* 136  K 3.9   < > 4.5 3.6 5.2*  CL 107   < > 104 98 99  CO2 23   < > 26 26 27   GLUCOSE 107*   < > 109* 95 107*  BUN 24*   < > 22 22 30*  CREATININE 0.74   < > 0.68 0.76 0.92  CALCIUM 8.9   < > 9.4 8.9 9.2  PROT 6.3*  --   --   --   --   ALBUMIN 3.4*  --   --   --   --   AST 17  --   --   --   --   ALT 11  --   --   --   --   ALKPHOS 59  --   --   --   --   BILITOT 0.6  --   --   --   --  GFRNONAA >60   < > >60 >60 >60  ANIONGAP 12   < > 11 10 10    < > = values in this interval not displayed.     Hematology Recent Labs  Lab 03/09/23 0611 03/10/23 0259 03/11/23 0324  WBC 6.6 6.7 6.2  RBC 4.20 4.11 4.03  HGB 12.7 12.3 12.1  HCT 39.6 37.9 38.1  MCV 94.3 92.2 94.5  MCH 30.2 29.9 30.0  MCHC 32.1 32.5 31.8  RDW 15.0 15.1 15.3  PLT 185 188 179    BNPNo results for input(s): "BNP", "PROBNP" in the last 168 hours.   DDimer No results for input(s): "DDIMER" in the last 168 hours.   CHA2DS2-VASc Score = 5   This indicates a 7.2% annual risk of stroke. The patient's score is based upon: CHF History: 1 HTN History: 1 Diabetes History: 0 Stroke History: 0 Vascular Disease History: 0 Age Score: 2 Gender Score: 1      Radiology    ECHO TEE Result Date: 03/14/2023    TRANSESOPHOGEAL ECHO REPORT   Patient Name:   Cassandra Ho Date of Exam: 03/14/2023 Medical Rec #:  [id number]           Height:       66.0 in Accession #:    [id number]          Weight:       291.2 lb Date of Birth:  [birth year]           BSA:          2.346 m Patient Age:    78 years            BP:           113/91 mmHg Patient Gender: F                   HR:           122 bpm. Exam Location:  Inpatient  Procedure: Transesophageal Echo, Cardiac Doppler and Color Doppler Indications:     Atrial fibrillation  History:         Patient has prior history of Echocardiogram examinations, most                  recent 03/10/2023. CHF, Arrythmias:Atrial Fibrillation; Risk                  Factors:Hypertension, Dyslipidemia and Sleep Apnea.  Sonographer:     [name] RCS Referring Phys:  [id number] Jonita Albee Diagnosing Phys: Chilton Si MD PROCEDURE: After discussion of the risks and benefits of a TEE, an informed consent was obtained from the patient. The transesophogeal probe was passed without difficulty through the esophogus of the patient. Imaged were obtained with the patient in a left lateral decubitus position. Sedation performed by different physician. The patient was monitored while under deep sedation. Anesthestetic sedation was provided intravenously by Anesthesiology: 192.1mg  of Propofol, 50mg  of Lidocaine. The patient's vital signs; including heart rate, blood pressure, and oxygen saturation; remained stable throughout the procedure. The patient developed no complications during the procedure. A successful direct current cardioversion was performed at 300 joules with 1 attempt.  IMPRESSIONS  1. Left ventricular ejection fraction, by estimation, is 30%. The left ventricle has moderately decreased function. The left ventricle demonstrates global hypokinesis.  2. Right ventricular systolic function is mildly reduced. The right ventricular size is normal.  3. No left atrial/left atrial appendage thrombus was detected.  4. Multiple regurgitant  jets. The mitral valve is normal in structure. Mild mitral valve regurgitation. No evidence of mitral stenosis.  5. The aortic valve is tricuspid. Aortic valve regurgitation is not visualized. No aortic stenosis is present.  6. The inferior vena cava is normal in size with greater than 50% respiratory variability, suggesting right atrial pressure of 3 mmHg.  Conclusion(s)/Recommendation(s): No LA/LAA thrombus identified. Successful cardioversion performed with restoration of normal sinus rhythm. FINDINGS  Left Ventricle: Left ventricular ejection fraction, by estimation, is 30%. The left ventricle has moderately decreased function. The left ventricle demonstrates global hypokinesis. The left ventricular internal cavity size was normal in size. There is no left ventricular hypertrophy. Right Ventricle: The right ventricular size is normal. No increase in right ventricular wall thickness. Right ventricular systolic function is mildly reduced. Left Atrium: Left atrial size was normal in size. No left atrial/left atrial appendage thrombus was detected. Right Atrium: Right atrial size was normal in size. Pericardium: There is no evidence of pericardial effusion. Mitral Valve: Multiple regurgitant jets. The mitral valve is normal in structure. Mild mitral valve regurgitation. No evidence of mitral valve stenosis. Tricuspid Valve: The tricuspid valve is normal in structure. Tricuspid valve regurgitation is mild . No evidence of tricuspid stenosis. Aortic Valve: The aortic valve is tricuspid. Aortic valve regurgitation is not visualized. No aortic stenosis is present. Pulmonic Valve: The pulmonic valve was normal in structure. Pulmonic valve regurgitation is not visualized. No evidence of pulmonic stenosis. Aorta: The aortic root is normal in size and structure. Venous: The inferior vena cava is normal in size with greater than 50% respiratory variability, suggesting right atrial pressure of 3 mmHg. IAS/Shunts: No atrial level shunt detected by color flow Doppler. Additional Comments: Spectral Doppler performed. MR Peak grad:    69.2 mmHg    TRICUSPID VALVE MR Mean grad:    50.0 mmHg    TR Peak grad:   23.6 mmHg MR Vmax:         416.00 cm/s  TR Vmax:        243.00 cm/s MR Vmean:        338.0 cm/s MR PISA:         1.57 cm MR PISA Eff ROA: 12 mm MR PISA Radius:  0.50 cm Chilton Si MD Electronically signed by Chilton Si MD Signature Date/Time: 03/14/2023/10:07:42 AM    Final    EP STUDY Result Date: 03/14/2023 See surgical note for result.    Patient Profile     78 y.o. female with a hx of HTN, HLD, recent URI, GERD, Pituitary tumor, acquired hypothyroid, OSA, morbid obesity.  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  HFrEF, new diagnosis with EF 30% -suspect tachy mediated from afib with RVR but could also be viral in origing after her URI -PTA hydrochlorothiazide stopped and now on Lasix 40mg  IV BID -I&O's are incomplete -Weight down 1 pound today -Serum creatinine up from 0.76->>0.92 today with  BUN slightly elevated at 30 today -suspect she has reached euvolemia and will transition to Lasix 40 mg daily p.o. -Continue Jardiance 10 mg daily -Will transition losartan to Entresto 24-26 mg twice daily  -Decrease Toprol XL to 25 mg daily she is in sinus rhythm to allow more BP room to add on Entresto -Need bmet in 1 week -Consider addition of MRA outpatient if BP and renal function remain stable  3. Induced hypothyroid - pta on Synthroid 150 mcg/day - free T3 and TSH ok but free T4 elevated so dose reduced to 125 mcg/day - per IM  4.  Hypertension -BP controlled on exam -transitioning losartan to Entresto 24-26 mg twice daily -BP was somewhat soft overnight so will decrease Toprol to 25 mg daily now that she is back in sinus rhythm   Other issues, per IM    I spent 30  minutes caring for this patient today face to face, ordering and reviewing labs, reviewing records from 2d echo and hospital notes this admission, seeing the patient, documenting in the record and adjusting meds and setting up TEE/DCCV.  [facility] will sign off.   Medication Recommendations: Apixaban 5 mg twice daily, Jardiance 10 mg daily, Lasix 40 mg twice daily, Toprol XL 25 mg daily, Crestor 20 mg daily, Entresto 24-26 mg twice daily Other recommendations (labs, testing, etc): Bmet in 1 week Follow up as an outpatient: [facility] in 1 week and me in 4 weeks     For questions or updates, please contact [facility] Please consult [email or web address] for contact info under        Signed, Armanda Magic, MD  03/14/2023, 12:23 PM

## 2023-03-14 NOTE — Progress Notes (Signed)
Echocardiogram Echocardiogram Transesophageal has been performed.  Cassandra Ho 03/14/2023, 9:51 AM

## 2023-03-14 NOTE — Progress Notes (Signed)
   Heart Failure Stewardship Pharmacist Progress Note   PCP: Collene Mares, PA PCP-Cardiologist: None    HPI:  78 yo F with PMH of HTN, HLD, and hypothyroidism.  Presented to the ED on 1/17 with shortness of breath. Reported to urgent care that she had been having URI symptoms for almost a month. She was found to be in afib RVR and was sent to the ED for further evaluation. She was started on cardizem gtt. CXR with cardiomegaly and mild pulmonary edema. ECHO on 1/19 showed LVEF 30%, global hypokinesis, RV normal, mild MR. Cardiology was then consulted. Cardizem stopped with new low EF and was transitioned to BB.   S/p successful TEE/DCCV on 1/23. EF 30%.  Denies shortness of breath. Feeling well after cardioversion today. No LE edema on exam. Hopeful for discharge tomorrow if stays in normal rhythm.   Current HF Medications: Diuretic: furosemide 40 mg PO daily Beta Blocker: metoprolol XL 25 mg daily ACE/ARB/ARNI: Entresto 24/26 mg BID SGLT2i: Jardiance 10 mg daily  Prior to admission HF Medications: ACE/ARB/ARNI: losartan/hydrochlorothiazide 100/25 mg daily  Pertinent Lab Values: Serum creatinine 0.76>0.92, BUN 30, Potassium 5.2 (received 80 mEq K 1/22), Sodium 136, Magnesium 2.1  Vital Signs: Weight: 291 lbs (admission weight: 275 lbs) Blood pressure: 90-110/70s  Heart rate: 70s post DCCV  I/O: incomplete  Medication Assistance / Insurance Benefits Check: Does the patient have prescription insurance?  Yes Type of insurance plan: [employer] Medicare  Outpatient Pharmacy:  Prior to admission outpatient pharmacy[facility] Is the patient willing to use [facility] TOC pharmacy at discharge? Yes Is the patient willing to transition their outpatient pharmacy to utilize a [facility] outpatient pharmacy?   No    Assessment: 1. 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 S/p successful TEE/DCCV 1/23. NYHA class II symptoms. - Agree with transitioning to  furosemide 40 mg PO daily. Strict I/Os and daily weights. Keep K>4 and Mg>2. K 5.2 today but received 80 mEq of potassium yesterday. Will continue to monitor.  - Agree with reducing metoprolol XL to 25 mg daily post DCCV - Agree with transition from losartan to Entresto 24/26 mg BID - Consider adding spironolactone 12.5 mg daily before discharge vs at follow up pending renal function and potassium - Continue Jardiance 10 mg daily - Discontinue hydrochlorothiazide on discharge   Plan: 1) Medication changes recommended at this time: - Agree with changes  2) Patient assistance: - Unmet deductible $590 - Entresto copay [phone] until deductible met, then reduces to $47 per month - Farxiga copay $467 until deductible met, then reduces to $47 per month - Patient states this is affordable for her  3)  Education  - Patient has been educated on current HF medications and potential additions to HF medication regimen - Patient verbalizes understanding that over the next few months, these medication doses may change and more medications may be added to optimize HF regimen - Patient has been educated on basic disease state pathophysiology and goals of therapy   Sharen Hones, PharmD, BCPS Heart Failure Stewardship Pharmacist Phone [phone]

## 2023-03-14 NOTE — CV Procedure (Signed)
Brief TEE Note  LVEF 30%.  Global hypokinesis. LA and RA are enlarged Multiple mild mitral valve regurgitant jets Mild TR Trivial PR No LA/LAA thrombus or masses.  For additional details see full report.   Electrical Cardioversion Procedure Note DONALYN TORCHIO [id number] 1945/09/27  Procedure: Electrical Cardioversion Indications:  Atrial Fibrillation  Procedure Details Consent: Risks of procedure as well as the alternatives and risks of each were explained to the (patient/caregiver).  Consent for procedure obtained. Time Out: Verified patient identification, verified procedure, site/side was marked, verified correct patient position, special equipment/implants available, medications/allergies/relevent history reviewed, required imaging and test results available.  Performed  Patient placed on cardiac monitor, pulse oximetry, supplemental oxygen as necessary.  Sedation given:  propofol Pacer pads placed anterior and posterior chest.  Cardioverted 1 time(s).  Cardioverted at 300J.  Evaluation Findings: Post procedure EKG shows: NSR Complications: None Patient did tolerate procedure well.   Ann-Marie Kluge C. Duke Salvia, MD, [facility] 03/14/2023 9:51 AM

## 2023-03-14 NOTE — Interval H&P Note (Signed)
History and Physical Interval Note:  03/14/2023 9:16 AM  Cassandra Ho  has presented today for surgery, with the diagnosis of afib with rvr.  The various methods of treatment have been discussed with the patient and family. After consideration of risks, benefits and other options for treatment, the patient has consented to  Procedure(s): TRANSESOPHAGEAL ECHOCARDIOGRAM (N/A) CARDIOVERSION (N/A) as a surgical intervention.  The patient's history has been reviewed, patient examined, no change in status, stable for surgery.  I have reviewed the patient's chart and labs.  Questions were answered to the patient's satisfaction.     Chilton Si, MD

## 2023-03-14 NOTE — Progress Notes (Signed)
  Progress Note   Patient: Cassandra Ho FAO:[id number] DOB: 11-27-1945 DOA: 03/08/2023     4 DOS: the patient was seen and examined on 03/14/2023   Brief hospital course: 78 y.o. female with a hx of HTN, HLD, recent URI, GERD, Pituitary tumor, acquired hypothyroid, OSA, morbid obesity.  -Had URI symptoms for few weeks with started improving, subsequently developed dyspnea on exertion, went to urgent care was diagnosed with A-fib RVR, sent to the ED, started on IV Cardizem and then p.o. -Echo noted EF down to 30% -Dr. Hollie Beach notes from the weekend reviewed  01/23 direct current cardioversion, converting to sinus rhythm.  Volume has improved.  Possible discharge home tomorrow   Assessment and Plan: * Atrial fibrillation with RVR (HCC) Sp direct current cardioversion, converting to sinus rhythm with good toleration.   Plan to continue AV blockade with metoprolol succinate 25 mg  Continue telemetry monitoring.  Anticoagulation with apixaban.   Acute on chronic systolic CHF (congestive heart failure) (HCC) Echocardiogram with reduced LV systolic function to 30%, with global hypokinesis with normal RV.   Negative fluid balance has been achieved, plan to transition to oral furosemide 40 mg po daily, continue with empagliflozin and change losartan to entresto.  Metoprolol succinate 25 mg daily.   Iatrogenic hyperthyroidism Pt slightly HYPERthyroid today with T4 of 1.24.  Presumably due to her home synthroid dose of being slightly too much for her. Reducing synthroid dose to 125 mcg daily.  Essential hypertension, benign Continue blood pressure control with entresto and metoprolol succinate.   Dyslipidemia Continue with statin therapy.   Urinary incontinence, mixed Continue mirabregon.   Obesity, class 3 Calculated BMI is 47.1  Hypokalemia Hyponatremia. Hyperkalemia   Follow up renal function with serum cr at 0,92, K is 5.2 and serum bicarbonate at 27  Na 136   Mg 2.1   Follow up renal function in am, expect improvement on K with diuretics.         Subjective: Patient is feeling better, with no dyspnea or chest pain, no lower extremity edema, no PND or orthopnea   Physical Exam: Vitals:   03/14/23 0822 03/14/23 0830 03/14/23 0957 03/14/23 1125  BP: 113/71 (!) 114/102  119/77  Pulse: 98 (!) 142    Resp: 20 11  16   Temp: 99.8 F (37.7 C) 98.2 F (36.8 C) 98.1 F (36.7 C) 97.8 F (36.6 C)  TempSrc: Oral Temporal Temporal Oral  SpO2: 100% 92%  95%  Weight:      Height:       Neurology awake and alert ENT with no pallor Cardiovascular with S1 and S2 present and regular with no gallops, rubs or murmurs No JVD No lower extremity edema Respiratory with no rales or wheezing  Abdomen with no distention  Data Reviewed:    Family Communication: no family at the bedside   Disposition: Status is: Inpatient Remains inpatient appropriate because: heart failure   Planned Discharge Destination: Home     Author: Coralie Keens, MD 03/14/2023 4:02 PM  For on call review [email or web address].

## 2023-03-14 NOTE — Anesthesia Postprocedure Evaluation (Signed)
Anesthesia Post Note  Patient: Cassandra Ho  Procedure(s) Performed: TRANSESOPHAGEAL ECHOCARDIOGRAM CARDIOVERSION     Patient location during evaluation: PACU Anesthesia Type: MAC Level of consciousness: awake and alert and oriented Pain management: pain level controlled Vital Signs Assessment: post-procedure vital signs reviewed and stable Respiratory status: spontaneous breathing, nonlabored ventilation and respiratory function stable Cardiovascular status: stable and blood pressure returned to baseline Postop Assessment: no apparent nausea or vomiting Anesthetic complications: no   No notable events documented.  Last Vitals:  Vitals:   03/14/23 0957 03/14/23 1125  BP:  119/77  Pulse:    Resp:  16  Temp: 36.7 C 36.6 C  SpO2:  95%    Last Pain:  Vitals:   03/14/23 1125  TempSrc: Oral  PainSc: 0-No pain                 Stavroula Rohde A.

## 2023-03-14 NOTE — Anesthesia Preprocedure Evaluation (Addendum)
Anesthesia Evaluation  Patient identified by MRN, date of birth, ID band Patient awake    Reviewed: Allergy & Precautions, NPO status , Patient's Chart, lab work & pertinent test results, reviewed documented beta blocker date and time   Airway Mallampati: III       Dental  (+) Teeth Intact, Dental Advisory Given   Pulmonary sleep apnea and Continuous Positive Airway Pressure Ventilation , former smoker   breath sounds clear to auscultation + decreased breath sounds      Cardiovascular hypertension, Pt. on medications and Pt. on home beta blockers +CHF  + dysrhythmias Atrial Fibrillation  Rhythm:Irregular Rate:Tachycardia  Echo 03/10/23 1. Left ventricular ejection fraction, by estimation, is 30%. The left  ventricle has moderately decreased function. The left ventricle  demonstrates global hypokinesis. Left ventricular diastolic function could  not be evaluated.   2. Right ventricular systolic function is normal. The right ventricular  size is normal.   3. The mitral valve was not well visualized. Mild mitral valve  regurgitation. No evidence of mitral stenosis.   4. The aortic valve was not well visualized. Aortic valve regurgitation  is not visualized.   5. The inferior vena cava is dilated in size with >50% respiratory  variability, suggesting right atrial pressure of 8 mmHg.   EKG  Hx/o A. Fib w/ RVR    Neuro/Psych  Headaches  negative psych ROS   GI/Hepatic Neg liver ROS,GERD  Medicated,,  Endo/Other  Hypothyroidism Hyperthyroidism Class 3 obesityHyperlipidemia  Renal/GU Renal InsufficiencyRenal disease  negative genitourinary   Musculoskeletal  (+) Arthritis , Osteoarthritis,    Abdominal  (+) + obese  Peds  Hematology negative hematology ROS (+) Eliquis therapy- last dose this am   Anesthesia Other Findings   Reproductive/Obstetrics                              Anesthesia  Physical Anesthesia Plan  ASA: 3  Anesthesia Plan: General and MAC   Post-op Pain Management: Minimal or no pain anticipated   Induction: Intravenous  PONV Risk Score and Plan: 3 and Treatment may vary due to age or medical condition and Propofol infusion  Airway Management Planned: Natural Airway and Nasal Cannula  Additional Equipment: None  Intra-op Plan:   Post-operative Plan:   Informed Consent: I have reviewed the patients History and Physical, chart, labs and discussed the procedure including the risks, benefits and alternatives for the proposed anesthesia with the patient or authorized representative who has indicated his/her understanding and acceptance.     Dental advisory given  Plan Discussed with: CRNA and Anesthesiologist  Anesthesia Plan Comments:          Anesthesia Quick Evaluation

## 2023-03-14 NOTE — Transfer of Care (Signed)
Immediate Anesthesia Transfer of Care Note  Patient: Cassandra Ho  Procedure(s) Performed: TRANSESOPHAGEAL ECHOCARDIOGRAM CARDIOVERSION  Patient Location: PACU  Anesthesia Type:MAC  Level of Consciousness: awake and alert   Airway & Oxygen Therapy: Patient Spontanous Breathing and Patient connected to nasal cannula oxygen  Post-op Assessment: Report given to RN and Post -op Vital signs reviewed and stable  Post vital signs: Reviewed and stable  Last Vitals:  Vitals Value Taken Time  BP    Temp    Pulse    Resp    SpO2      Last Pain:  Vitals:   03/14/23 0830  TempSrc: Temporal  PainSc:          Complications: No notable events documented.

## 2023-03-14 NOTE — Plan of Care (Signed)

## 2023-03-15 ENCOUNTER — Encounter (HOSPITAL_COMMUNITY): Payer: Self-pay | Admitting: Cardiovascular Disease

## 2023-03-15 ENCOUNTER — Other Ambulatory Visit (HOSPITAL_COMMUNITY): Payer: Self-pay

## 2023-03-15 DIAGNOSIS — E876 Hypokalemia: Secondary | ICD-10-CM | POA: Diagnosis not present

## 2023-03-15 DIAGNOSIS — I10 Essential (primary) hypertension: Secondary | ICD-10-CM | POA: Diagnosis not present

## 2023-03-15 DIAGNOSIS — I5023 Acute on chronic systolic (congestive) heart failure: Secondary | ICD-10-CM | POA: Diagnosis not present

## 2023-03-15 DIAGNOSIS — I4891 Unspecified atrial fibrillation: Secondary | ICD-10-CM | POA: Diagnosis not present

## 2023-03-15 LAB — BASIC METABOLIC PANEL
Anion gap: 10 (ref 5–15)
BUN: 24 mg/dL — ABNORMAL HIGH (ref 8–23)
CO2: 24 mmol/L (ref 22–32)
Calcium: 9.3 mg/dL (ref 8.9–10.3)
Chloride: 104 mmol/L (ref 98–111)
Creatinine, Ser: 0.7 mg/dL (ref 0.44–1.00)
GFR, Estimated: >60 mL/min (ref >60)
Glucose, Bld: 184 mg/dL — ABNORMAL HIGH (ref 70–99)
Potassium: 3.9 mmol/L (ref 3.5–5.1)
Sodium: 138 mmol/L (ref 135–145)

## 2023-03-15 MED ORDER — FUROSEMIDE 40 MG PO TABS
40.0000 mg | ORAL_TABLET | Freq: Two times a day (BID) | ORAL | 0 refills | Status: DC
  Filled 2023-03-15: qty 60, 30d supply, fill #0

## 2023-03-15 MED ORDER — METOPROLOL SUCCINATE ER 25 MG PO TB24
25.0000 mg | ORAL_TABLET | Freq: Every day | ORAL | 0 refills | Status: DC
  Filled 2023-03-15: qty 30, 30d supply, fill #0

## 2023-03-15 MED ORDER — SACUBITRIL-VALSARTAN 24-26 MG PO TABS
1.0000 | ORAL_TABLET | Freq: Two times a day (BID) | ORAL | 0 refills | Status: DC
  Filled 2023-03-15: qty 60, 30d supply, fill #0

## 2023-03-15 MED ORDER — LEVOTHYROXINE SODIUM 125 MCG PO TABS
125.0000 ug | ORAL_TABLET | Freq: Every day | ORAL | 0 refills | Status: AC
  Filled 2023-03-15: qty 30, 30d supply, fill #0

## 2023-03-15 MED ORDER — APIXABAN 5 MG PO TABS
5.0000 mg | ORAL_TABLET | Freq: Two times a day (BID) | ORAL | 0 refills | Status: DC
  Filled 2023-03-15: qty 60, 30d supply, fill #0

## 2023-03-15 MED ORDER — POTASSIUM CHLORIDE CRYS ER 20 MEQ PO TBCR
20.0000 meq | EXTENDED_RELEASE_TABLET | Freq: Every day | ORAL | 0 refills | Status: DC
  Filled 2023-03-15: qty 30, 30d supply, fill #0

## 2023-03-15 MED ORDER — EMPAGLIFLOZIN 10 MG PO TABS
10.0000 mg | ORAL_TABLET | Freq: Every day | ORAL | 0 refills | Status: DC
  Filled 2023-03-15: qty 30, 30d supply, fill #0

## 2023-03-15 NOTE — Discharge Summary (Signed)
Physician Discharge Summary   Patient: Cassandra Ho MRN: [id number] DOB: 12-Jun-1945  Admit date:     03/08/2023  Discharge date: 03/15/23  Discharge Physician: York Ram Adela Esteban   PCP: Hyacinth Meeker, [place], [place]   Recommendations at discharge:    Patient sp direct current cardioversion, will continue rate control with metoprolol succinate and anticoagulation with apixaban.  Discontinue aspirin to decrease risk of bleeding.  Heart failure guideline medical therapy with entresto, empagliglozin and metoprolol succinate. Possible addition of mineralocorticoid receptor blocker as outpatient.  Add Kcl supplements and follow up renal function and electrolytes in 7 days as outpatient.  Follow up with Evangeline Dakin PA in 7 to 10 days Follow up with Cardiology as scheduled.   Discharge Diagnoses: Principal Problem:   Atrial fibrillation with RVR (HCC) Active Problems:   Acute on chronic systolic CHF (congestive heart failure) (HCC)   Hypokalemia   Essential hypertension, benign   Dyslipidemia   Urinary incontinence, mixed   Obesity, class 3   Iatrogenic hyperthyroidism  Resolved Problems:   * No resolved hospital problems. [facility] Course: Mrs. Cassandra Ho was admitted to the hospital with the working diagnosis of acute on chronic systolic heart failure in the setting of atrial fibrillation with RVR.   78 y.o. female with a hx of HTN, HLD, recent URI, GERD, Pituitary tumor, acquired hypothyroid, OSA, and obesity.  Patient had an upper respiratory infection about one month prior to admission, she had outpatient follow up on the day of admission and found in atrial fibrillation with rapid ventricular response, 150 bpm. She was referred to the ED.  On her initial physical examination her blood pressure was 147/95, HR 119, RR 18 and 02 saturation 91%, on IV diltiazem, heart with S1 and S2 present, irregularly irregular, with no gallops or rubs, respiratory with no wheezing or rhonchi,  with mild rales, abdomen with no distention and positive lower extremity edema.   Na 142, K 3,9 Cl 107, bicarbonate 23, glucose 141m bun 24 and cr 0,74  Wbc 8,8 hgb 13,7 plt 224  TSH 2,29 T 4 free 1,24 T3 free 2,5  Sars covid 19 negative  Influenza negative   Chest radiograph with hypoinflation, cardiomegaly with bilateral hilar vascular congestion, bilateral central interstitial infiltrates more at lower lobes, small bilateral pleural effusions.   LV function noted to be reduced and diltiazem was discontinued.   Patient was placed on AV blockade with B blocker.  01/23 direct current cardioversion, converting to sinus rhythm.  Volume has improved.  01/24 patient continue sinus rhythm, and improved volume status. Plan for discharge home and follow up with Cardiology as outpatient.   Assessment and Plan: * Atrial fibrillation with RVR (HCC) Sp direct current cardioversion, converting to sinus rhythm with good toleration.   Plan to continue AV blockade with metoprolol succinate 25 mg  Anticoagulation with apixaban. Cha2ds2 Vasc core is 5   Acute on chronic systolic CHF (congestive heart failure) (HCC) Echocardiogram with reduced LV systolic function to 30%, with global hypokinesis with normal RV.   Patient had IV furosemide for diuresis, negative fluid balance was achieved, with improvement in her symptoms.   Continue heart failure management with empagliflozin and Entresto   Metoprolol succinate 25 mg daily.  Diuresis with furosemide 40 mg po bid.   Hypokalemia Hyponatremia. Hyperkalemia   Renal function has remained stable, with serum cr at 0,7 with K at 3,9 and serum bicarbonate at 24  Will add Kcl supplements to avoid hyokalemia.  Follow  up renal function and electrolytes in 7 days as outpatient.   Essential hypertension, benign Continue blood pressure control with entresto and metoprolol succinate.   Dyslipidemia Continue with statin therapy.   Urinary incontinence,  mixed Continue mirabregon.   Obesity, class 3 Calculated BMI is 47.1  Iatrogenic hyperthyroidism Mild elevation in free T4, in the setting of atrial fibrillation with RVR, dose of levothyroxine has been reduced.  Follow thyroid function test as outpatient in 2 to 3 weeks.          Consultants: cardiology  Procedures performed: TEE cardioversion (direct current)   Disposition: Home Diet recommendation:  Cardiac diet DISCHARGE MEDICATION: Allergies as of 03/15/2023   No Known Allergies      Medication List     STOP taking these medications    aspirin 81 MG tablet   ibuprofen 200 MG tablet Commonly known as: ADVIL   losartan-hydrochlorothiazide 100-25 MG tablet Commonly known as: HYZAAR       TAKE these medications    apixaban 5 MG Tabs tablet Commonly known as: ELIQUIS Take 1 tablet (5 mg total) by mouth 2 (two) times daily.   CALCIUM 1200 PO Take 1 tablet by mouth daily.   conjugated estrogens vaginal cream Commonly known as: PREMARIN Place 1 Applicatorful vaginally 2 (two) times a week. Place 0.5g nightly for two weeks then twice a week after   empagliflozin 10 MG Tabs tablet Commonly known as: JARDIANCE Take 1 tablet (10 mg total) by mouth daily. Start taking on: March 16, 2023   famotidine 20 MG tablet Commonly known as: PEPCID Take 20 mg by mouth daily as needed for heartburn or indigestion.   furosemide 40 MG tablet Commonly known as: LASIX Take 1 tablet (40 mg total) by mouth 2 (two) times daily.   Gemtesa 75 MG Tabs Generic drug: Vibegron Take 1 tablet (75 mg total) by mouth daily.   levothyroxine 125 MCG tablet Commonly known as: SYNTHROID Take 1 tablet (125 mcg total) by mouth daily before breakfast. Start taking on: March 16, 2023 What changed:  medication strength how much to take   loratadine 10 MG tablet Commonly known as: CLARITIN Take 10 mg by mouth daily as needed for allergies.   metoprolol succinate 25 MG 24 hr  tablet Commonly known as: TOPROL-XL Take 1 tablet (25 mg total) by mouth daily. Start taking on: March 16, 2023   potassium chloride SA 20 MEQ tablet Commonly known as: KLOR-CON M Take 1 tablet (20 mEq total) by mouth daily. Start taking on: March 16, 2023   psyllium 95 % Pack Commonly known as: HYDROCIL/METAMUCIL Take 1 packet by mouth daily.   rosuvastatin 20 MG tablet Commonly known as: CRESTOR Take 1 tablet (20 mg total) by mouth daily. Please make overdue appt with Dr. Mayford Knife before anymore refills. 2nd attempt   sacubitril-valsartan 24-26 MG Commonly known as: ENTRESTO Take 1 tablet by mouth 2 (two) times daily.        Follow-up Information     [facility]. Go in 9 day(s).   Specialty: Cardiology Why: Hospital follow up 03/22/2023 @ 3 pm PLEASE bring a current medication list to appointment FREE valet parking, Entrance C, off [employer] information: [address] [place] [address] [phone]        Quintella Reichert, MD Follow up.   Specialty: Cardiology Why: Humberto Seals - [address] location - general cardiology follow-up arranged on Thursday Apr 11, 2023  10:55 AM (Arrive by 10:40 AM). Clayburn Pert is one of our PAs with Dr. Norris Cross team. Contact information: [address] [place] [address] [place] [address] [phone]                Discharge Exam: Ceasar Mons Weights   03/13/23 0445 03/14/23 0409 03/15/23 0324  Weight: 132.5 kg 132.1 kg 132.4 kg   BP 112/65 (BP Location: Left Arm)   Pulse 78   Temp 97.8 F (36.6 C) (Tympanic)   Resp 18   Ht 5\' 6"  (1.676 m)   Wt 132.4 kg   SpO2 93%   BMI 47.11 kg/m   Patient with improvement in her symptoms with no dyspnea or chest pain, no palpitations, no edema, no PND or orthopnea.  Neurology awake and alert ENT with no pallor Cardiovascular with S1 and S2 present and regular with no gallops, rubs or murmurs Respiratory  with no rales or wheezing, no rhonchi Abdomen with no distention  No lower extremity edema   Condition at discharge: stable  The results of significant diagnostics from this hospitalization (including imaging, microbiology, ancillary and laboratory) are listed below for reference.   Imaging Studies: ECHO TEE Result Date: 03/14/2023    TRANSESOPHOGEAL ECHO REPORT   Patient Name:   TAYLORANN TKACH Date of Exam: 03/14/2023 Medical Rec #:  [id number]           Height:       66.0 in Accession #:    [id number]          Weight:       291.2 lb Date of Birth:  04/23/1945           BSA:          2.346 m Patient Age:    77 years            BP:           113/91 mmHg Patient Gender: F                   HR:           122 bpm. Exam Location:  Inpatient Procedure: Transesophageal Echo, Cardiac Doppler and Color Doppler Indications:     Atrial fibrillation  History:         Patient has prior history of Echocardiogram examinations, most                  recent 03/10/2023. CHF, Arrythmias:Atrial Fibrillation; Risk                  Factors:Hypertension, Dyslipidemia and Sleep Apnea.  Sonographer:     [name] RCS Referring Phys:  [id number] Jonita Albee Diagnosing Phys: Chilton Si MD PROCEDURE: After discussion of the risks and benefits of a TEE, an informed consent was obtained from the patient. The transesophogeal probe was passed without difficulty through the esophogus of the patient. Imaged were obtained with the patient in a left lateral decubitus position. Sedation performed by different physician. The patient was monitored while under deep sedation. Anesthestetic sedation was provided intravenously by Anesthesiology: 192.1mg  of Propofol, 50mg  of Lidocaine. The patient's vital signs; including heart rate, blood pressure, and oxygen saturation; remained stable throughout the procedure. The patient developed no complications during the procedure. A successful direct current cardioversion was performed  at 300 joules with 1 attempt.  IMPRESSIONS  1. Left ventricular ejection fraction, by estimation, is 30%. The left ventricle has moderately decreased function. The left ventricle demonstrates  global hypokinesis.  2. Right ventricular systolic function is mildly reduced. The right ventricular size is normal.  3. No left atrial/left atrial appendage thrombus was detected.  4. Multiple regurgitant jets. The mitral valve is normal in structure. Mild mitral valve regurgitation. No evidence of mitral stenosis.  5. The aortic valve is tricuspid. Aortic valve regurgitation is not visualized. No aortic stenosis is present.  6. The inferior vena cava is normal in size with greater than 50% respiratory variability, suggesting right atrial pressure of 3 mmHg. Conclusion(s)/Recommendation(s): No LA/LAA thrombus identified. Successful cardioversion performed with restoration of normal sinus rhythm. FINDINGS  Left Ventricle: Left ventricular ejection fraction, by estimation, is 30%. The left ventricle has moderately decreased function. The left ventricle demonstrates global hypokinesis. The left ventricular internal cavity size was normal in size. There is no left ventricular hypertrophy. Right Ventricle: The right ventricular size is normal. No increase in right ventricular wall thickness. Right ventricular systolic function is mildly reduced. Left Atrium: Left atrial size was normal in size. No left atrial/left atrial appendage thrombus was detected. Right Atrium: Right atrial size was normal in size. Pericardium: There is no evidence of pericardial effusion. Mitral Valve: Multiple regurgitant jets. The mitral valve is normal in structure. Mild mitral valve regurgitation. No evidence of mitral valve stenosis. Tricuspid Valve: The tricuspid valve is normal in structure. Tricuspid valve regurgitation is mild . No evidence of tricuspid stenosis. Aortic Valve: The aortic valve is tricuspid. Aortic valve regurgitation is not  visualized. No aortic stenosis is present. Pulmonic Valve: The pulmonic valve was normal in structure. Pulmonic valve regurgitation is not visualized. No evidence of pulmonic stenosis. Aorta: The aortic root is normal in size and structure. Venous: The inferior vena cava is normal in size with greater than 50% respiratory variability, suggesting right atrial pressure of 3 mmHg. IAS/Shunts: No atrial level shunt detected by color flow Doppler. Additional Comments: Spectral Doppler performed. MR Peak grad:    69.2 mmHg    TRICUSPID VALVE MR Mean grad:    50.0 mmHg    TR Peak grad:   23.6 mmHg MR Vmax:         416.00 cm/s  TR Vmax:        243.00 cm/s MR Vmean:        338.0 cm/s MR PISA:         1.57 cm MR PISA Eff ROA: 12 mm MR PISA Radius:  0.50 cm Chilton Si MD Electronically signed by Chilton Si MD Signature Date/Time: 03/14/2023/10:07:42 AM    Final    EP STUDY Result Date: 03/14/2023 See surgical note for result.  DG CHEST PORT 1 VIEW Result Date: 03/10/2023 CLINICAL DATA:  Acute hypoxemic respiratory failure. EXAM: PORTABLE CHEST 1 VIEW COMPARISON:  Chest radiograph dated 03/08/2023. FINDINGS: The heart is enlarged. There is mild-to-moderate bilateral lower lung predominant atelectasis/airspace disease. A small right pleural effusion may contribute. No pneumothorax. Degenerative changes are seen in the spine. IMPRESSION: Mild-to-moderate bilateral lower lung predominant atelectasis/airspace disease. A small right pleural effusion may contribute. Electronically Signed   By: Romona Curls M.D.   On: 03/10/2023 13:12   ECHOCARDIOGRAM COMPLETE Result Date: 03/10/2023    ECHOCARDIOGRAM REPORT   Patient Name:   Cassandra Ho Date of Exam: 03/10/2023 Medical Rec #:  [id number]           Height:       66.0 in Accession #:    [id number]          Weight:  275.0 lb Date of Birth:  1945-07-30           BSA:          2.290 m Patient Age:    53 years            BP:           123/99 mmHg Patient  Gender: F                   HR:           108 bpm. Exam Location:  Inpatient Procedure: 2D Echo, Cardiac Doppler, Color Doppler and Intracardiac            Opacification Agent Indications:    Atrial Fibrillation I48.91  History:        Patient has no prior history of Echocardiogram examinations.                 CHF, Arrythmias:Atrial Fibrillation; Risk Factors:Hypertension                 and Sleep Apnea.  Sonographer:    [name] RCS Referring Phys: [id number] Jilda Roche [facility]  Sonographer Comments: Patient is obese. IMPRESSIONS  1. Left ventricular ejection fraction, by estimation, is 30%. The left ventricle has moderately decreased function. The left ventricle demonstrates global hypokinesis. Left ventricular diastolic function could not be evaluated.  2. Right ventricular systolic function is normal. The right ventricular size is normal.  3. The mitral valve was not well visualized. Mild mitral valve regurgitation. No evidence of mitral stenosis.  4. The aortic valve was not well visualized. Aortic valve regurgitation is not visualized.  5. The inferior vena cava is dilated in size with >50% respiratory variability, suggesting right atrial pressure of 8 mmHg. Comparison(s): No prior Echocardiogram. FINDINGS  Left Ventricle: Left ventricular ejection fraction, by estimation, is 30%. The left ventricle has moderately decreased function. The left ventricle demonstrates global hypokinesis. Definity contrast agent was given IV to delineate the left ventricular endocardial borders. The left ventricular internal cavity size was normal in size. Suboptimal image quality limits for assessment of left ventricular hypertrophy. Left ventricular diastolic function could not be evaluated due to atrial fibrillation. Left  ventricular diastolic function could not be evaluated. Right Ventricle: The right ventricular size is normal. No increase in right ventricular wall thickness. Right ventricular systolic function is  normal. Left Atrium: Left atrial size was normal in size. Right Atrium: Right atrial size was normal in size. Pericardium: Trivial pericardial effusion is present. The pericardial effusion is surrounding the apex. Presence of epicardial fat layer. Mitral Valve: The mitral valve was not well visualized. Mild mitral valve regurgitation. No evidence of mitral valve stenosis. Tricuspid Valve: The tricuspid valve is not well visualized. Tricuspid valve regurgitation is trivial. No evidence of tricuspid stenosis. Aortic Valve: The aortic valve was not well visualized. Aortic valve regurgitation is not visualized. Aortic valve peak gradient measures 12.1 mmHg. Pulmonic Valve: The pulmonic valve was not well visualized. Pulmonic valve regurgitation is not visualized. No evidence of pulmonic stenosis. Aorta: The aortic root and ascending aorta are structurally normal, with no evidence of dilitation. Venous: The inferior vena cava is dilated in size with greater than 50% respiratory variability, suggesting right atrial pressure of 8 mmHg. IAS/Shunts: The atrial septum is grossly normal.  LEFT VENTRICLE PLAX 2D LVOT diam:     2.10 cm   Diastology LV SV:         48  LV e' medial:    8.59 cm/s LV SV Index:   21        LV E/e' medial:  13.7 LVOT Area:     3.46 cm  LV e' lateral:   11.05 cm/s                          LV E/e' lateral: 10.7  RIGHT VENTRICLE             IVC RV S prime:     12.90 cm/s  IVC diam: 2.70 cm TAPSE (M-mode): 1.7 cm LEFT ATRIUM           Index        RIGHT ATRIUM           Index LA Vol (A4C): 63.2 ml 27.60 ml/m  RA Area:     19.95 cm                                    RA Volume:   56.75 ml  24.79 ml/m  AORTIC VALVE AV Area (Vmax): 1.86 cm AV Vmax:        174.00 cm/s AV Peak Grad:   12.1 mmHg LVOT Vmax:      93.40 cm/s LVOT Vmean:     66.400 cm/s LVOT VTI:       0.138 m  AORTA Ao Root diam: 3.60 cm Ao Asc diam:  3.10 cm MITRAL VALVE MV Area (PHT): 4.08 cm     SHUNTS MV Decel Time: 186 msec      Systemic VTI:  0.14 m MR Peak grad: 78.9 mmHg     Systemic Diam: 2.10 cm MR Vmax:      444.00 cm/s MV E velocity: 118.00 cm/s Riley Lam MD Electronically signed by Riley Lam MD Signature Date/Time: 03/10/2023/12:04:19 PM    Final    DG Chest Portable 1 View Result Date: 03/08/2023 CLINICAL DATA:  Cough, shortness of breath EXAM: PORTABLE CHEST 1 VIEW COMPARISON:  01/13/2021 FINDINGS: Cardiomegaly, vascular congestion. Diffuse interstitial prominence throughout the lungs. Bilateral lower lobe airspace opacities, favor edema. No definite effusions. No pneumothorax. No acute bony abnormality. IMPRESSION: Cardiomegaly, mild pulmonary edema. Electronically Signed   By: Charlett Nose M.D.   On: 03/08/2023 18:04    Microbiology: Results for orders placed or performed during the hospital encounter of 03/08/23  Resp panel by RT-PCR (RSV, Flu A&B, Covid) Anterior Nasal Swab     Status: None   Collection Time: 03/08/23  5:00 PM   Specimen: Anterior Nasal Swab  Result Value Ref Range Status   SARS Coronavirus 2 by RT PCR NEGATIVE NEGATIVE Final   Influenza A by PCR NEGATIVE NEGATIVE Final   Influenza B by PCR NEGATIVE NEGATIVE Final    Comment: (NOTE) The Xpert Xpress SARS-CoV-2/FLU/RSV plus assay is intended as an aid in the diagnosis of influenza from Nasopharyngeal swab specimens and should not be used as a sole basis for treatment. Nasal washings and aspirates are unacceptable for Xpert Xpress SARS-CoV-2/FLU/RSV testing.  Fact Sheet for Patients: BloggerCourse.com  Fact Sheet for Healthcare Providers: SeriousBroker.it  This test is not yet approved or cleared by the [place] FDA and has been authorized for detection and/or diagnosis of SARS-CoV-2 by FDA under an Emergency Use Authorization (EUA). This EUA will remain in effect (meaning this test can be used) for the duration of the COVID-19 declaration  under Section  564(b)(1) of the Act, 21 U.S.C. section 360bbb-3(b)(1), unless the authorization is terminated or revoked.     Resp Syncytial Virus by PCR NEGATIVE NEGATIVE Final    Comment: (NOTE) Fact Sheet for Patients: BloggerCourse.com  Fact Sheet for Healthcare Providers: SeriousBroker.it  This test is not yet approved or cleared by the [place] FDA and has been authorized for detection and/or diagnosis of SARS-CoV-2 by FDA under an Emergency Use Authorization (EUA). This EUA will remain in effect (meaning this test can be used) for the duration of the COVID-19 declaration under Section 564(b)(1) of the Act, 21 U.S.C. section 360bbb-3(b)(1), unless the authorization is terminated or revoked.  Performed at [facility], [address], [place], [place] [address]     Labs: CBC: Recent Labs  Lab 03/08/23 1655 03/09/23 0611 03/10/23 0259 03/11/23 0324  WBC 8.8 6.6 6.7 6.2  NEUTROABS 5.9  --   --   --   HGB 13.7 12.7 12.3 12.1  HCT 42.6 39.6 37.9 38.1  MCV 94.7 94.3 92.2 94.5  PLT 224 185 188 179   Basic Metabolic Panel: Recent Labs  Lab 03/08/23 1655 03/09/23 0611 03/11/23 0324 03/12/23 0309 03/13/23 0259 03/14/23 0307 03/15/23 0840  NA 142   < > 138 141 134* 136 138  K 3.9   < > 3.4* 4.5 3.6 5.2* 3.9  CL 107   < > 101 104 98 99 104  CO2 23   < > 26 26 26 27 24   GLUCOSE 107*   < > 109* 109* 95 107* 184*  BUN 24*   < > 20 22 22  30* 24*  CREATININE 0.74   < > 0.62 0.68 0.76 0.92 0.70  CALCIUM 8.9   < > 8.9 9.4 8.9 9.2 9.3  MG 1.9  --   --  1.9  --  2.1  --    < > = values in this interval not displayed.   Liver Function Tests: Recent Labs  Lab 03/08/23 1655  AST 17  ALT 11  ALKPHOS 59  BILITOT 0.6  PROT 6.3*  ALBUMIN 3.4*   CBG: No results for input(s): "GLUCAP" in the last 168 hours.  Discharge time spent: greater than 30 minutes.  Signed: Coralie Keens, MD [facility] 03/15/2023

## 2023-03-15 NOTE — Care Management Important Message (Signed)
Important Message  Patient Details  Name: Cassandra Ho MRN: [id number] Date of Birth: 1945-12-22   Important Message Given:  Yes - Medicare IM     Renie Ora 03/15/2023, 11:26 AM

## 2023-03-15 NOTE — Progress Notes (Signed)
   Heart Failure Stewardship Pharmacist Progress Note   PCP: Collene Mares, PA PCP-Cardiologist: None    HPI:  78 yo F with PMH of HTN, HLD, and hypothyroidism.  Presented to the ED on 1/17 with shortness of breath. Reported to urgent care that she had been having URI symptoms for almost a month. She was found to be in afib RVR and was sent to the ED for further evaluation. She was started on cardizem gtt. CXR with cardiomegaly and mild pulmonary edema. ECHO on 1/19 showed LVEF 30%, global hypokinesis, RV normal, mild MR. Cardiology was then consulted. Cardizem stopped with new low EF and was transitioned to BB.   S/p successful TEE/DCCV on 1/23. EF 30%.  Denies shortness of breath. No LE edema on exam. Remains in sinus rhythm. Hopeful for discharge today. Discussed anticipated medication cost again and patient confirms she is able to afford this.   Current HF Medications: Diuretic: furosemide 40 mg PO daily Beta Blocker: metoprolol XL 25 mg daily ACE/ARB/ARNI: Entresto 24/26 mg BID SGLT2i: Jardiance 10 mg daily  Prior to admission HF Medications: ACE/ARB/ARNI: losartan/hydrochlorothiazide 100/25 mg daily  Pertinent Lab Values: Labs pending 1/24 As of 1/23: Serum creatinine 0.92, BUN 30, Potassium 5.2 (received 80 mEq K 1/22), Sodium 136, Magnesium 2.1  Vital Signs: Weight: 291 lbs (admission weight: 275 lbs) Blood pressure: 130/78s  Heart rate: 70-80s I/O: incomplete  Medication Assistance / Insurance Benefits Check: Does the patient have prescription insurance?  Yes Type of insurance plan: [facility] Medicare  Outpatient Pharmacy:  Prior to admission outpatient [facility] Is the patient willing to use [facility]OC pharmacy at discharge? Yes Is the patient willing to transition their outpatient pharmacy to utilize a [facility] outpatient pharmacy?   No    Assessment: 1. 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 S/p successful TEE/DCCV  1/23. NYHA class II symptoms. - Continue furosemide 40 mg PO daily. Strict I/Os and daily weights. Keep K>4 and Mg>2. Labs pending.  - Continue metoprolol XL 25 mg daily  - Continue Entresto 24/26 mg BID - Consider adding spironolactone 12.5 mg daily before discharge vs at follow up pending renal function and potassium - Continue Jardiance 10 mg daily - Discontinue hydrochlorothiazide on discharge   Plan: 1) Medication changes recommended at this time: - Continue current regimen, labs pending  2) Patient assistance: - Unmet deductible $590 - Entresto copay [phone] until deductible met, then reduces to $47 per month - Farxiga copay $467 until deductible met, then reduces to $47 per month - Patient states this is affordable for her  3)  Education  - Patient has been educated on current HF medications and potential additions to HF medication regimen - Patient verbalizes understanding that over the next few months, these medication doses may change and more medications may be added to optimize HF regimen - Patient has been educated on basic disease state pathophysiology and goals of therapy   Sharen Hones, PharmD, BCPS Heart Failure Stewardship Pharmacist Phone [phone]

## 2023-03-22 ENCOUNTER — Ambulatory Visit (HOSPITAL_BASED_OUTPATIENT_CLINIC_OR_DEPARTMENT_OTHER)
Admit: 2023-03-22 | Discharge: 2023-03-22 | Disposition: A | Discharge status: Home / Self Care | Payer: Medicare Other | Attending: Adult Health | Admitting: Adult Health

## 2023-03-22 ENCOUNTER — Emergency Department (HOSPITAL_COMMUNITY)
Admission: EM | Admit: 2023-03-22 | Discharge: 2023-03-22 | Disposition: A | Discharge status: Home / Self Care | Payer: Medicare Other | Attending: Emergency Medicine | Admitting: Emergency Medicine

## 2023-03-22 ENCOUNTER — Emergency Department (HOSPITAL_COMMUNITY): Payer: Medicare Other

## 2023-03-22 ENCOUNTER — Encounter (HOSPITAL_COMMUNITY): Payer: Self-pay

## 2023-03-22 ENCOUNTER — Other Ambulatory Visit: Payer: Self-pay

## 2023-03-22 VITALS — BP 131/98 | HR 150 | Ht 66.0 in | Wt 287.3 lb

## 2023-03-22 DIAGNOSIS — I5022 Chronic systolic (congestive) heart failure: Secondary | ICD-10-CM

## 2023-03-22 DIAGNOSIS — I10 Essential (primary) hypertension: Secondary | ICD-10-CM

## 2023-03-22 DIAGNOSIS — I110 Hypertensive heart disease with heart failure: Secondary | ICD-10-CM | POA: Insufficient documentation

## 2023-03-22 DIAGNOSIS — E039 Hypothyroidism, unspecified: Secondary | ICD-10-CM | POA: Insufficient documentation

## 2023-03-22 DIAGNOSIS — I4891 Unspecified atrial fibrillation: Secondary | ICD-10-CM | POA: Diagnosis not present

## 2023-03-22 DIAGNOSIS — Z7901 Long term (current) use of anticoagulants: Secondary | ICD-10-CM | POA: Insufficient documentation

## 2023-03-22 DIAGNOSIS — E785 Hyperlipidemia, unspecified: Secondary | ICD-10-CM | POA: Insufficient documentation

## 2023-03-22 DIAGNOSIS — R0989 Other specified symptoms and signs involving the circulatory and respiratory systems: Secondary | ICD-10-CM | POA: Diagnosis not present

## 2023-03-22 DIAGNOSIS — I480 Paroxysmal atrial fibrillation: Secondary | ICD-10-CM | POA: Insufficient documentation

## 2023-03-22 DIAGNOSIS — G4733 Obstructive sleep apnea (adult) (pediatric): Secondary | ICD-10-CM

## 2023-03-22 DIAGNOSIS — R002 Palpitations: Secondary | ICD-10-CM | POA: Diagnosis not present

## 2023-03-22 DIAGNOSIS — Z79899 Other long term (current) drug therapy: Secondary | ICD-10-CM | POA: Insufficient documentation

## 2023-03-22 DIAGNOSIS — I517 Cardiomegaly: Secondary | ICD-10-CM | POA: Diagnosis not present

## 2023-03-22 LAB — COMPREHENSIVE METABOLIC PANEL
ALT: 16 U/L (ref 0–44)
AST: 20 U/L (ref 15–41)
Albumin: 4.1 g/dL (ref 3.5–5.0)
Alkaline Phosphatase: 68 U/L (ref 38–126)
Anion gap: 13 (ref 5–15)
BUN: 21 mg/dL (ref 8–23)
CO2: 21 mmol/L — ABNORMAL LOW (ref 22–32)
Calcium: 9.3 mg/dL (ref 8.9–10.3)
Chloride: 106 mmol/L (ref 98–111)
Creatinine, Ser: 0.88 mg/dL (ref 0.44–1.00)
GFR, Estimated: >60 mL/min (ref >60)
Glucose, Bld: 102 mg/dL — ABNORMAL HIGH (ref 70–99)
Potassium: 4.3 mmol/L (ref 3.5–5.1)
Sodium: 140 mmol/L (ref 135–145)
Total Bilirubin: 0.6 mg/dL (ref 0.0–1.2)
Total Protein: 7.2 g/dL (ref 6.5–8.1)

## 2023-03-22 LAB — CBC WITH DIFFERENTIAL/PLATELET
Abs Immature Granulocytes: 0.02 10*3/uL (ref 0.00–0.07)
Basophils Absolute: 0.1 10*3/uL (ref 0.0–0.1)
Basophils Relative: 1 %
Eosinophils Absolute: 0.1 10*3/uL (ref 0.0–0.5)
Eosinophils Relative: 2 %
HCT: 49.5 % — ABNORMAL HIGH (ref 36.0–46.0)
Hemoglobin: 16 g/dL — ABNORMAL HIGH (ref 12.0–15.0)
Immature Granulocytes: 0 %
Lymphocytes Relative: 30 %
Lymphs Abs: 2.5 10*3/uL (ref 0.7–4.0)
MCH: 30.1 pg (ref 26.0–34.0)
MCHC: 32.3 g/dL (ref 30.0–36.0)
MCV: 93 fL (ref 80.0–100.0)
Monocytes Absolute: 0.8 10*3/uL (ref 0.1–1.0)
Monocytes Relative: 10 %
Neutro Abs: 4.7 10*3/uL (ref 1.7–7.7)
Neutrophils Relative %: 57 %
Platelets: 279 10*3/uL (ref 150–400)
RBC: 5.32 MIL/uL — ABNORMAL HIGH (ref 3.87–5.11)
RDW: 14.8 % (ref 11.5–15.5)
WBC: 8.3 10*3/uL (ref 4.0–10.5)
nRBC: 0 % (ref 0.0–0.2)

## 2023-03-22 LAB — MAGNESIUM: Magnesium: 2.2 mg/dL (ref 1.7–2.4)

## 2023-03-22 LAB — BRAIN NATRIURETIC PEPTIDE: B Natriuretic Peptide: 178.5 pg/mL — ABNORMAL HIGH (ref 0.0–100.0)

## 2023-03-22 LAB — TROPONIN I (HIGH SENSITIVITY): Troponin I (High Sensitivity): 5 ng/L (ref <18)

## 2023-03-22 MED ORDER — AMIODARONE LOAD VIA INFUSION
150.0000 mg | Freq: Once | INTRAVENOUS | Status: DC
  Filled 2023-03-22: qty 83.34

## 2023-03-22 MED ORDER — ETOMIDATE 2 MG/ML IV SOLN: 15.0000 mg | Freq: Once | INTRAVENOUS | Status: DC

## 2023-03-22 MED ORDER — AMIODARONE HCL 200 MG PO TABS: 200.0000 mg | ORAL_TABLET | Freq: Two times a day (BID) | ORAL | 1 refills | Status: DC

## 2023-03-22 MED ORDER — AMIODARONE HCL 200 MG PO TABS: ORAL_TABLET | ORAL | 0 refills | Status: DC

## 2023-03-22 MED ORDER — ETOMIDATE 2 MG/ML IV SOLN: 20.0000 mg | Freq: Once | INTRAVENOUS | Status: DC

## 2023-03-22 MED ORDER — AMIODARONE HCL IN DEXTROSE 360-4.14 MG/200ML-% IV SOLN: 30.0000 mg/h | INTRAVENOUS | Status: DC

## 2023-03-22 MED ORDER — AMIODARONE HCL IN DEXTROSE 360-4.14 MG/200ML-% IV SOLN: 60.0000 mg/h | INTRAVENOUS | Status: DC

## 2023-03-22 MED ORDER — AMIODARONE HCL 200 MG PO TABS
400.0000 mg | ORAL_TABLET | Freq: Every day | ORAL | Status: DC
  Administered 2023-03-22: 400 mg via ORAL
  Filled 2023-03-22: qty 2

## 2023-03-22 MED ORDER — ETOMIDATE 2 MG/ML IV SOLN
INTRAVENOUS | Status: AC | PRN
  Administered 2023-03-22: 15 mg via INTRAVENOUS

## 2023-03-22 NOTE — Sedation Documentation (Signed)
Patient cardioverted at 200J per Dr Silverio Lay. Patient in sinus tach at this time.

## 2023-03-22 NOTE — Patient Instructions (Signed)
Medication Changes:  START AMIODARONE 200MG  TWICE DAILY   Follow-Up in: AS SCHEDULED WITH DR. Shirlee Latch   At the [facility], you and your health needs are our priority. We have a designated team specialized in the treatment of Heart Failure. This Care Team includes your primary Heart Failure Specialized Cardiologist (physician), Advanced Practice Providers (APPs- Physician Assistants and Nurse Practitioners), and Pharmacist who all work together to provide you with the care you need, when you need it.   You may see any of the following providers on your designated Care Team at your next follow up:  Dr. Arvilla Meres Dr. Marca Ancona Dr. Dorthula Nettles Dr. Theresia Bough Tonye Becket, NP Robbie Lis, [place] [facility] [place], [place] Brynda Peon, NP [place] Lee, NP Karle Plumber, PharmD   Please be sure to bring in all your medications bottles to every appointment.   Need to Contact us:  If you have any questions or concerns before your next appointment please send [place] a message through [place] or call our office at [phone].    TO LEAVE A MESSAGE FOR THE NURSE SELECT OPTION 2, PLEASE LEAVE A MESSAGE INCLUDING: YOUR NAME DATE OF BIRTH CALL BACK NUMBER REASON FOR CALL**this is important as we prioritize the call backs  YOU WILL RECEIVE A CALL BACK THE SAME DAY AS LONG AS YOU CALL BEFORE 4:00 PM

## 2023-03-22 NOTE — Progress Notes (Signed)
HEART & VASCULAR TRANSITION OF CARE CONSULT NOTE     Referring Physician:Dr Arrien Primary Care: Evangeline Dakin PA  Primary Cardiologist:  Dr Mayford Knife   Chief Complaint: Heart Failure/ A fgib RVR    HPI: Referred to clinic by Dr Ella Jubilee for heart failure consultation.   Cassandra Ho is a 78 year old with a history of HTN, HLD, PAF, OSA, hypothyroid, pituitary tumor, and chronic HFrEF. Last cigarette 50 years ago. .   Admitted 03/08/23 with A fib RVR.  Underwent cardioversion. Echo showed reduced LVEF 30-35% .  Placed on 25 mg Toprol XL daily. Started on GDMT with bb, arni, and sglt2i.    She is limited by knee pain. Left knee in particular painful.  Uses wheelchair. Gets a little short of breath trying to walk. Denies PND/Orthopnea. Heart rate at home 60-80s. She does not feel her heart raciing.  Appetite ok. No fever or chills. Weight at home 287 pounds. Taking all medications.She has not missed any doses of eliquis.   Cardiac Testing  02/2023 Echo LVEF 30-35% RV normal   03/14/2023 TEE- DC-CV-->SR  Past Medical History:  Diagnosis Date   Allergic rhinitis    Arthritis    generalized-ankles and knees   BCC (basal cell carcinoma of skin)    Constipation    Dyslipidemia    Edema of both lower extremities    GERD (gastroesophageal reflux disease)    H/O: pituitary tumor    "irradicated" no problems now   Headache    migraines in past   High cholesterol    Hypertension    Hypothyroidism    Hypothyroidism (acquired)    Post-ablation   Joint pain    Morbid obesity (HCC)    OSA (obstructive sleep apnea)    intolerant to CPAP and now using an oral device   Osteoarthritis    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 with 31.4 mCi of I-131 on 12/05/2007   Transfusion history    with hip surgery    Current Outpatient Medications  Medication Sig Dispense Refill   amiodarone (PACERONE) 200 MG tablet Take 1 tablet (200 mg total) by mouth 2 (two) times  daily. 60 tablet 1   apixaban (ELIQUIS) 5 MG TABS tablet Take 1 tablet (5 mg total) by mouth 2 (two) times daily. 60 tablet 0   Calcium Carbonate-Vit D-Min (CALCIUM 1200 PO) Take 1 tablet by mouth daily.     conjugated estrogens (PREMARIN) vaginal cream Place 1 Applicatorful vaginally 2 (two) times a week. Place 0.5g nightly for two weeks then twice a week after 30 g 2   empagliflozin (JARDIANCE) 10 MG TABS tablet Take 1 tablet (10 mg total) by mouth daily. 30 tablet 0   famotidine (PEPCID) 20 MG tablet Take 20 mg by mouth daily as needed for heartburn or indigestion.     furosemide (LASIX) 40 MG tablet Take 1 tablet (40 mg total) by mouth 2 (two) times daily. 60 tablet 0   levothyroxine (SYNTHROID) 125 MCG tablet Take 1 tablet (125 mcg total) by mouth daily before breakfast. 30 tablet 0   loratadine (CLARITIN) 10 MG tablet Take 10 mg by mouth daily as needed for allergies.     metoprolol succinate (TOPROL-XL) 25 MG 24 hr tablet Take 1 tablet (25 mg total) by mouth daily. 30 tablet 0   potassium chloride SA (KLOR-CON M) 20 MEQ tablet Take 1 tablet (20 mEq total) by mouth daily. 30 tablet 0  psyllium (HYDROCIL/METAMUCIL) 95 % PACK Take 1 packet by mouth daily.     rosuvastatin (CRESTOR) 20 MG tablet Take 1 tablet (20 mg total) by mouth daily. Please make overdue appt with Dr. Mayford Knife before anymore refills. 2nd attempt 15 tablet 0   sacubitril-valsartan (ENTRESTO) 24-26 MG Take 1 tablet by mouth 2 (two) times daily. 60 tablet 0   Vibegron (GEMTESA) 75 MG TABS Take 1 tablet (75 mg total) by mouth daily. 28 tablet 0   No current facility-administered medications for this encounter.    No Known Allergies    Social History   Socioeconomic History   Marital status: Married    Spouse name: Cassandra Ho   Number of children: 3   Years of education: Not on file   Highest education level: Bachelor's degree (e.g., BA, AB, BS)  Occupational History   Occupation: Retired  Tobacco Use   Smoking status:  Former    Types: Cigarettes   Smokeless tobacco: Never   Tobacco comments:    Quit '78  Vaping Use   Vaping status: Never Used  Substance and Sexual Activity   Alcohol use: Yes    Comment: ocass   Drug use: No   Sexual activity: Not Currently    Partners: Male    Birth control/protection: Abstinence  Other Topics Concern   Not on file  Social History Narrative   ** Merged History Encounter **       Social Drivers of Health   Financial Resource Strain: Low Risk  (03/12/2023)   Overall Financial Resource Strain (CARDIA)    Difficulty of Paying Living Expenses: Not very hard  Food Insecurity: No Food Insecurity (03/10/2023)   Hunger Vital Sign    Worried About Running Out of Food in the Last Year: Never true    Ran Out of Food in the Last Year: Never true  Transportation Needs: No Transportation Needs (03/10/2023)   PRAPARE - Administrator, Civil Service (Medical): No    Lack of Transportation (Non-Medical): No  Physical Activity: Not on file  Stress: Not on file  Social Connections: Unknown (03/10/2023)   Social Connection and Isolation Panel [NHANES]    Frequency of Communication with Friends and Family: More than three times a week    Frequency of Social Gatherings with Friends and Family: More than three times a week    Attends Religious Services: Patient declined    Database administrator or Organizations: Patient declined    Attends Banker Meetings: Patient declined    Marital Status: Patient declined  Intimate Partner Violence: Not At Risk (03/10/2023)   Humiliation, Afraid, Rape, and Kick questionnaire    Fear of Current or Ex-Partner: No    Emotionally Abused: No    Physically Abused: No    Sexually Abused: No      Family History  Problem Relation Age of Onset   Thyroid cancer Mother    Lung cancer Mother    Melanoma Mother    Diabetes Mother    Thyroid disease Mother    Alcohol abuse Mother    Bladder Cancer Mother    Alcoholism  Father    Cirrhosis Father    High blood pressure Father    High Cholesterol Father    Fibromyalgia Sister    Breast cancer Sister        30s   Breast cancer Maternal Grandmother        11s   Breast cancer Paternal Grandmother  60s   Breast cancer Daughter 85   Breast cancer Maternal Aunt        60s    Vitals:   03/22/23 1503 03/22/23 1537  BP: (!) 153/108 (!) 131/98  Pulse:  (!) 150  SpO2: 98% 96%  Weight: 130.3 kg (287 lb 4.8 oz)   Height: 5\' 6"  (1.676 m)    Wt Readings from Last 3 Encounters:  03/22/23 130.3 kg (287 lb 4.8 oz)  03/15/23 132.4 kg (291 lb 14.2 oz)  01/07/23 135.2 kg (298 lb)    PHYSICAL EXAM: General:  Well appearing. No respiratory difficulty HEENT: normal Neck: supple. no JVD. Carotids 2+ bilat; no bruits. No lymphadenopathy or thryomegaly appreciated. Cor: PMI nondisplaced. Tachy Irregular rate & rhythm. No rubs, gallops or murmurs. Lungs: clear Abdomen: soft, nontender, nondistended. No hepatosplenomegaly. No bruits or masses. Good bowel sounds. Extremities: no cyanosis, clubbing, rash, edema Neuro: alert & oriented x 3, cranial nerves grossly intact. moves all 4 extremities w/o difficulty. Affect pleasant.  ECG: A fib RVR 156 bpm    ASSESSMENT & PLAN: 1. Chronic HFrEF 02/2023 Echo EF 30-35% RV normal . 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  NYHA III.Volume status trending up.Concerned she will decompensate because she is back in A fib RVR.   GDMT  Diuretic-Continue lasix 40 mg po twice a day  BB- Continue Toprol XL 25 mg daily  Ace/ARB/ARNI- Continue entresto 24-26 mg twice a day  MRA- Consider adding next visit.  SGLT2i- Add jardiance 10 mg daily  Once SR restored will repeat ECHO 3-4 months after HF meds optimized . If EF remains low will need to consider ischemic work up.   2.  A fib RVR  Recently diagnosed. S/P DC-CV-->SR 03/14/23  -EKG today she is back in A fib RVR 156 bpm.  - Start amio 200 mg twice a day . Long term not  ideal with thyroid dysfunction.  -Continue Toprol XL 25 mg daily  -Continue eliquis 5 mg twice a day . She has not missed any doses of eliquis.  - Will need referral to A fib clinic -Needs sleep study.  3.Hypothyroid Recent thyroid level reviewed.  Will need to follow closely with addition of amiodarone.   4. HTN  Elevated. Adjust meds at follow up.   5.OSA Previously diagnosed with OSA. Needs another sleep study.   Discussed with Dr Shirlee Latch. Will need to send to the ED for cardioversion. Rapid Response called.    Referred to HFSW (PCP, Medications, Transportation, ETOH Abuse, Drug Abuse, Insurance, Financial ): No Refer to Pharmacy:  No Refer to Home Health:  No Refer to [facility]: Yes Dr Shirlee Latch   Refer to General Cardiology: Share with Dr Mayford Knife.    Follow up with Dr Shirlee Latch next month.   Shyteria Lewis NP-C  3:46 PM

## 2023-03-22 NOTE — Discharge Instructions (Addendum)
As we discussed, you went into rapid A-fib.  You are back in sinus rhythm now  Your cardiologist prescribed 400 mg of amiodarone for a week.  You need to call your cardiologist for follow-up next week  Return to ER if you have chest pain or shortness of breath or palpitations

## 2023-03-22 NOTE — Consult Note (Addendum)
Cassandra Hess, NP  Nurse Practitioner Heart Failure   Progress Notes    Signed   Date of Service: 03/22/2023  3:00 PM  Related encounter: HEART & VASCULAR TRANSITION OF CARE NEW from 03/22/2023 in [facility]   Signed     Expand All Collapse All         HEART & VASCULAR TRANSITION OF CARE CONSULT NOTE        Referring Physician:Dr Cassandra Ho Primary Care: Cassandra Dakin PA  Primary Cardiologist:  Dr Cassandra Ho    Chief Complaint: Heart Failure/ A fgib RVR     HPI: Referred to clinic by Dr Cassandra Ho for heart failure consultation.    [name] is a 78 year old with a history of HTN, HLD, PAF, OSA, hypothyroid, pituitary tumor, and chronic HFrEF. Last cigarette 50 years ago. .    Admitted 03/08/23 with A fib RVR.  Underwent cardioversion. Echo showed reduced LVEF 30-35% .  Placed on 25 mg Toprol XL daily. Started on GDMT with bb, arni, and sglt2i.     She is limited by knee pain. Left knee in particular painful.  Uses wheelchair. Gets a little short of breath trying to walk. Denies PND/Orthopnea. Heart rate at home 60-80s. She does not feel her heart raciing.  Appetite ok. No fever or chills. Weight at home 287 pounds. Taking all medications.She has not missed any doses of eliquis.    Cardiac Testing  02/2023 Echo LVEF 30-35% RV normal    03/14/2023 TEE- DC-CV-->SR       Past Medical History:  Diagnosis Date   Allergic rhinitis     Arthritis      generalized-ankles and knees   BCC (basal cell carcinoma of skin)     Constipation     Dyslipidemia     Edema of both lower extremities     GERD (gastroesophageal reflux disease)     H/O: pituitary tumor      "irradicated" no problems now   Headache      migraines in past   High cholesterol     Hypertension     Hypothyroidism     Hypothyroidism (acquired)      Post-ablation   Joint pain     Morbid obesity (HCC)     OSA (obstructive sleep apnea)      intolerant to CPAP and now  using an oral device   Osteoarthritis     Toxic solitary thyroid nodule      radioactive iodine therapy with 31.4 mCi of I-131 on 12/05/2007   Transfusion history      with hip surgery                Current Outpatient Medications  Medication Sig Dispense Refill   amiodarone (PACERONE) 200 MG tablet Take 1 tablet (200 mg total) by mouth 2 (two) times daily. 60 tablet 1   apixaban (ELIQUIS) 5 MG TABS tablet Take 1 tablet (5 mg total) by mouth 2 (two) times daily. 60 tablet 0   Calcium Carbonate-Vit D-Min (CALCIUM 1200 PO) Take 1 tablet by mouth daily.       conjugated estrogens (PREMARIN) vaginal cream Place 1 Applicatorful vaginally 2 (two) times a week. Place 0.5g nightly for two weeks then twice a week after 30 g 2   empagliflozin (JARDIANCE) 10 MG TABS tablet Take 1 tablet (10 mg total) by mouth daily. 30 tablet 0   famotidine (PEPCID) 20 MG tablet Take 20 mg  by mouth daily as needed for heartburn or indigestion.       furosemide (LASIX) 40 MG tablet Take 1 tablet (40 mg total) by mouth 2 (two) times daily. 60 tablet 0   levothyroxine (SYNTHROID) 125 MCG tablet Take 1 tablet (125 mcg total) by mouth daily before breakfast. 30 tablet 0   loratadine (CLARITIN) 10 MG tablet Take 10 mg by mouth daily as needed for allergies.       metoprolol succinate (TOPROL-XL) 25 MG 24 hr tablet Take 1 tablet (25 mg total) by mouth daily. 30 tablet 0   potassium chloride SA (KLOR-CON M) 20 MEQ tablet Take 1 tablet (20 mEq total) by mouth daily. 30 tablet 0   psyllium (HYDROCIL/METAMUCIL) 95 % PACK Take 1 packet by mouth daily.       rosuvastatin (CRESTOR) 20 MG tablet Take 1 tablet (20 mg total) by mouth daily. Please make overdue appt with Dr. Mayford Ho before anymore refills. 2nd attempt 15 tablet 0   sacubitril-valsartan (ENTRESTO) 24-26 MG Take 1 tablet by mouth 2 (two) times daily. 60 tablet 0   Vibegron (GEMTESA) 75 MG TABS Take 1 tablet (75 mg total) by mouth daily. 28 tablet 0      No current  facility-administered medications for this encounter.        Allergies  No Known Allergies       Social History         Socioeconomic History   Marital status: Married      Spouse name: Cassandra Ho   Number of children: 3   Years of education: Not on file   Highest education level: Bachelor's degree (e.g., BA, AB, BS)  Occupational History   Occupation: Retired  Tobacco Use   Smoking status: Former      Types: Cigarettes   Smokeless tobacco: Never   Tobacco comments:      Quit '78  Vaping Use   Vaping status: Never Used  Substance and Sexual Activity   Alcohol use: Yes      Comment: ocass   Drug use: No   Sexual activity: Not Currently      Partners: Male      Birth control/protection: Abstinence  Other Topics Concern   Not on file  Social History Narrative    ** Merged History Encounter **         Social Drivers of Health        Financial Resource Strain: Low Risk  (03/12/2023)    Overall Financial Resource Strain (CARDIA)     Difficulty of Paying Living Expenses: Not very hard  Food Insecurity: No Food Insecurity (03/10/2023)    Hunger Vital Sign     Worried About Running Out of Food in the Last Year: Never true     Ran Out of Food in the Last Year: Never true  Transportation Needs: No Transportation Needs (03/10/2023)    PRAPARE - Therapist, art (Medical): No     Lack of Transportation (Non-Medical): No  Physical Activity: Not on file  Stress: Not on file  Social Connections: Unknown (03/10/2023)    Social Connection and Isolation Panel [NHANES]     Frequency of Communication with Friends and Family: More than three times a week     Frequency of Social Gatherings with Friends and Family: More than three times a week     Attends Religious Services: Patient declined     Database administrator or Organizations: Patient declined  Attends Banker Meetings: Patient declined     Marital Status: Patient declined  Intimate  Partner Violence: Not At Risk (03/10/2023)    Humiliation, Afraid, Rape, and Kick questionnaire     Fear of Current or Ex-Partner: No     Emotionally Abused: No     Physically Abused: No     Sexually Abused: No             Family History  Problem Relation Age of Onset   Thyroid cancer Mother     Lung cancer Mother     Melanoma Mother     Diabetes Mother     Thyroid disease Mother     Alcohol abuse Mother     Bladder Cancer Mother     Alcoholism Father     Cirrhosis Father     High blood pressure Father     High Cholesterol Father     Fibromyalgia Sister     Breast cancer Sister          15s   Breast cancer Maternal Grandmother          1s   Breast cancer Paternal Grandmother          44s   Breast cancer Daughter 17   Breast cancer Maternal Aunt          50s              Vitals:    03/22/23 1503 03/22/23 1537  BP: (!) 153/108 (!) 131/98  Pulse:   (!) 150  SpO2: 98% 96%  Weight: 130.3 kg (287 lb 4.8 oz)    Height: 5\' 6"  (1.676 m)         Wt Readings from Last 3 Encounters:  03/22/23 130.3 kg (287 lb 4.8 oz)  03/15/23 132.4 kg (291 lb 14.2 oz)  01/07/23 135.2 kg (298 lb)      PHYSICAL EXAM: General:  Well appearing. No respiratory difficulty HEENT: normal Neck: supple. no JVD. Carotids 2+ bilat; no bruits. No lymphadenopathy or thryomegaly appreciated. Cor: PMI nondisplaced. Tachy Irregular rate & rhythm. No rubs, gallops or murmurs. Lungs: clear Abdomen: soft, nontender, nondistended. No hepatosplenomegaly. No bruits or masses. Good bowel sounds. Extremities: no cyanosis, clubbing, rash, edema Neuro: alert & oriented x 3, cranial nerves grossly intact. moves all 4 extremities w/o difficulty. Affect pleasant.   ECG: A fib RVR 156 bpm      ASSESSMENT & PLAN: 1. Chronic HFrEF 02/2023 Echo EF 30-35% RV normal . 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  NYHA III.Volume status trending up.Concerned she will decompensate because she is back in A fib RVR.    GDMT  Diuretic-Continue lasix 40 mg po twice a day  BB- Continue Toprol XL 25 mg daily  Ace/ARB/ARNI- Continue entresto 24-26 mg twice a day  MRA- Consider adding next visit.  SGLT2i- Add jardiance 10 mg daily  Once SR restored will repeat ECHO 3-4 months after HF meds optimized . If EF remains low will need to consider ischemic work up.    2.  A fib RVR  Recently diagnosed. S/P DC-CV-->SR 03/14/23  -EKG today she is back in A fib RVR 156 bpm.  - Start amio 200 mg twice a day . Long term not ideal with thyroid dysfunction.  -Continue Toprol XL 25 mg daily  -Continue eliquis 5 mg twice a day . She has not missed any doses of eliquis.  - Will need referral to A fib clinic -Needs sleep study.  3.Hypothyroid Recent thyroid level reviewed.  Will need to follow closely with addition of amiodarone.    4. HTN  Elevated. Adjust meds at follow up.    5.OSA Previously diagnosed with OSA. Needs another sleep study.    Discussed with Dr Shirlee Latch. Will need to send to the ED for cardioversion. Rapid Response called.      Referred to HFSW (PCP, Medications, Transportation, ETOH Abuse, Drug Abuse, Insurance, Financial ): No Refer to Pharmacy:  No Refer to Home Health:  No Refer to Advanced Heart Failure Clinic: Yes Dr Shirlee Latch   Refer to General Cardiology: Share with Dr Cassandra Ho.      Follow up with Dr Shirlee Latch next month.    Amy Clegg NP-C  3:46 PM          As above, patient seen and examined.  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  Recently discharged following admission for CHF and atrial fibrillation.  Echocardiogram March 10, 2023 showed ejection fraction 30%, mild mitral regurgitation.  Patient underwent transesophageal echocardiogram on  January 23 which showed ejection fraction 30%, mild RV dysfunction, no left atrial appendage thrombus, mild mitral regurgitation.  Had successful cardioversion at that time.  Patient treated with guideline directed medical therapy and discharged.  She presented for follow-up today in the heart failure clinic and was noted to be in atrial fibrillation with rapid ventricular response.  She was sent to the emergency room for further evaluation.  We were asked to evaluate for admission for IV amiodarone versus cardioversion.  Notes she denies dyspnea, chest pain, palpitations or bleeding.  She has missed no doses of her Eliquis.  1 recurrent atrial fibrillation-patient has developed recurrent atrial fibrillation with rapid ventricular response.  She is asymptomatic.  However likely with recent tachycardia mediated cardiomyopathy.  She has missed no doses of apixaban.  Will plan to proceed with cardioversion now.  We will treat with amiodarone 400 mg twice daily for 7 days then 200 mg daily thereafter.  Continue Toprol.  She will need follow-up in atrial fibrillation clinic for consideration of ablation.  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  Continue Lasix, Toprol, Entresto.  Jardiance added at heart failure clinic with plans for addition of spironolactone at next office visit.  Laboratories including potassium and renal function are pending.  3 hypertension-blood pressure is mildly elevated in the emergency room.  Will follow closely as an outpatient and adjust medications as needed.  4 history of obstructive sleep apnea-will need repeat sleep study and CPAP as needed.  5 history of hypothyroid-will need close follow-up with addition of amiodarone.  Note laboratories including CBC, bmet, magnesium, troponin and BNP pending.  Will review prior to discharge.  If patient maintains sinus rhythm following cardioversion she can be discharged with follow-up scheduled with APP February 20 with Dr.  Shirlee Latch February 24.  Addendum: Patient successfully cardioverted by ER physician to sinus rhythm.  Olga Millers, MD

## 2023-03-22 NOTE — ED Provider Notes (Signed)
[place] EMERGENCY DEPARTMENT AT [facility] Provider Note   CSN: [id number] Arrival date & time: 03/22/23  1552     History  Chief Complaint  Patient presents with   Atrial Fibrillation    Cassandra Ho is a 78 y.o. female history of heart failure with a EF of 30%, A-fib on Eliquis here presenting with rapid A-fib.  Patient was just admitted for A-fib with RVR.  Patient had a TEE cardioversion.  Patient was discharged from the hospital about a week ago.  Patient had her 1 week follow-up in cardiology's office.  Patient was noted to be in rapid A-fib with a rate of 160s.  Patient was immediately sent here for further evaluation.  She states that she took her pulse this morning and it was 60.  She states that she did not have any palpitations or chest pain.  Patient is compliant with her medicines and took Eliquis at 9 AM this morning.  The history is provided by the patient.       Home Medications Prior to Admission medications   Medication Sig Start Date End Date Taking? Authorizing Provider  amiodarone (PACERONE) 200 MG tablet Take 1 tablet (200 mg total) by mouth 2 (two) times daily. 03/22/23   Clegg, Amy D, NP  apixaban (ELIQUIS) 5 MG TABS tablet Take 1 tablet (5 mg total) by mouth 2 (two) times daily. 03/15/23   Arrien, York Ram, MD  Calcium Carbonate-Vit D-Min (CALCIUM 1200 PO) Take 1 tablet by mouth daily.    [provider]  conjugated estrogens (PREMARIN) vaginal cream Place 1 Applicatorful vaginally 2 (two) times a week. Place 0.5g nightly for two weeks then twice a week after 01/07/23   Wyatt Haste T, MD  empagliflozin (JARDIANCE) 10 MG TABS tablet Take 1 tablet (10 mg total) by mouth daily. 03/16/23 04/15/23  Arrien, York Ram, MD  famotidine (PEPCID) 20 MG tablet Take 20 mg by mouth daily as needed for heartburn or indigestion.    [provider]  furosemide (LASIX) 40 MG tablet Take 1 tablet (40 mg total) by mouth 2 (two) times  daily. 03/15/23   Arrien, York Ram, MD  levothyroxine (SYNTHROID) 125 MCG tablet Take 1 tablet (125 mcg total) by mouth daily before breakfast. 03/16/23   Arrien, York Ram, MD  loratadine (CLARITIN) 10 MG tablet Take 10 mg by mouth daily as needed for allergies.    [provider]  metoprolol succinate (TOPROL-XL) 25 MG 24 hr tablet Take 1 tablet (25 mg total) by mouth daily. 03/16/23 04/15/23  Arrien, York Ram, MD  potassium chloride SA (KLOR-CON M) 20 MEQ tablet Take 1 tablet (20 mEq total) by mouth daily. 03/16/23   Arrien, York Ram, MD  psyllium (HYDROCIL/METAMUCIL) 95 % PACK Take 1 packet by mouth daily.    [provider]  rosuvastatin (CRESTOR) 20 MG tablet Take 1 tablet (20 mg total) by mouth daily. Please make overdue appt with Dr. Mayford Knife before anymore refills. 2nd attempt 01/12/19   Quintella Reichert, MD  sacubitril-valsartan (ENTRESTO) 24-26 MG Take 1 tablet by mouth 2 (two) times daily. 03/15/23 04/14/23  Arrien, York Ram, MD  Vibegron (GEMTESA) 75 MG TABS Take 1 tablet (75 mg total) by mouth daily. 01/07/23   Loleta Chance, MD      Allergies    Patient has no known allergies.    Review of Systems   Review of Systems  Respiratory:  Negative for shortness of breath.   Cardiovascular:  Negative for palpitations.  All other systems reviewed and are negative.   Physical Exam Updated Vital Signs BP (!) 150/115 (BP Location: Right Arm)   Pulse (!) 56   Temp 98 F (36.7 C) (Oral)   Resp 18   SpO2 98%  Physical Exam Vitals and nursing note reviewed.  Constitutional:      Comments: Chronically ill appearing   HENT:     Head: Normocephalic.     Nose: Nose normal.     Mouth/Throat:     Mouth: Mucous membranes are moist.  Eyes:     Extraocular Movements: Extraocular movements intact.     Pupils: Pupils are equal, round, and reactive to light.  Cardiovascular:     Comments: Tachycardic, irregular  Abdominal:     General: Abdomen is  flat.     Palpations: Abdomen is soft.  Musculoskeletal:        General: Normal range of motion.     Cervical back: Normal range of motion.  Skin:    General: Skin is warm.     Capillary Refill: Capillary refill takes less than 2 seconds.  Neurological:     General: No focal deficit present.     Mental Status: She is alert and oriented to person, place, and time.  Psychiatric:        Mood and Affect: Mood normal.        Behavior: Behavior normal.     ED Results / Procedures / Treatments   Labs (all labs ordered are listed, but only abnormal results are displayed) Labs Reviewed  CBC WITH DIFFERENTIAL/PLATELET  COMPREHENSIVE METABOLIC PANEL  BRAIN NATRIURETIC PEPTIDE  MAGNESIUM  TROPONIN I (HIGH SENSITIVITY)    EKG None  Radiology No results found.  Procedures .Sedation  Date/Time: 03/22/2023 6:39 PM  Performed by: Charlynne Pander, MD Authorized by: Charlynne Pander, MD   Consent:    Consent obtained:  Written   Consent given by:  Patient   Risks discussed:  Prolonged hypoxia resulting in organ damage Universal protocol:    Immediately prior to procedure, a time out was called: yes   Pre-sedation assessment:    Time since last food or drink:  6 hours   ASA classification: class 1 - normal, healthy patient     Mallampati score:  I - soft palate, uvula, fauces, pillars visible   Pre-sedation assessments completed and reviewed: airway patency and mental status   A pre-sedation assessment was completed prior to the start of the procedure Procedure details (see MAR for exact dosages):    Sedation:  Etomidate   Intended level of sedation: deep   Total Provider sedation time (minutes):  30 Post-procedure details:   A post-sedation assessment was completed following the completion of the procedure.   Post-sedation assessments completed and reviewed: airway patency, mental status and respiratory function     Procedure completion:  Tolerated well, no immediate  complications .Cardioversion  Date/Time: 03/22/2023 6:40 PM  Performed by: Charlynne Pander, MD Authorized by: Charlynne Pander, MD   Consent:    Consent obtained:  Verbal and written   Consent given by:  Patient   Risks discussed:  Cutaneous burn, death and induced arrhythmia   Alternatives discussed:  No treatment Pre-procedure details:    Cardioversion basis:  Emergent   Rhythm:  Atrial fibrillation   Electrode placement:  Anterior-posterior Patient sedated: Yes. Refer to sedation procedure documentation for details of sedation.  Attempt one:    Cardioversion mode:  Synchronous  Shock (Joules):  200   Shock outcome:  Conversion to normal sinus rhythm Post-procedure details:    Patient status:  Awake     Medications Ordered in ED Medications - No data to display  ED Course/ Medical Decision Making/ A&P                                 Medical Decision Making Cassandra Ho is a 78 y.o. female here presenting with rapid A-fib.  Patient was just cardioverted and have 1 week follow-up.  Patient is in cardiology office and was noted to be in rapid A-fib.  Patient is EF was 30% last week.  Will consult cardiology and likely start amiodarone drip.    4:30 pm I discussed case with Dr. Wyline Mood from cardiology.  I told her that I am hesitant to cardiovert patient since she failed 1 week ago.  She states that cardiology will come and see the patient  5 pm Dr. Jens Som from cardiology saw the patient and recommend cardioversion.   6:38 PM Cardioversion was performed by me and was successful.  Patient is back in sinus rhythm now.  Patient is awake and alert and had no complications.  Reviewed her labs and they were unremarkable.  Cardiology increased her amiodarone to 400 mg twice daily for a week.  They will follow-up with her.  Problems Addressed: Atrial fibrillation, unspecified type [facility]): acute illness or injury  Amount and/or Complexity of Data Reviewed Labs:  ordered. Decision-making details documented in ED Course. Radiology: ordered and independent interpretation performed. Decision-making details documented in ED Course. ECG/medicine tests: ordered and independent interpretation performed. Decision-making details documented in ED Course.  Risk Prescription drug management.    Final Clinical Impression(s) / ED Diagnoses Final diagnoses:  None    Rx / DC Orders ED Discharge Orders     None         Charlynne Pander, MD 03/22/23 [phone]

## 2023-03-22 NOTE — ED Notes (Signed)
Pt d/c home per EDP order. Discharge summary reviewed, verbalize understanding. Off unit via WC. No acute distress noted. Pt husband who is visitor is discharge ride home.

## 2023-03-22 NOTE — ED Notes (Addendum)
Pt to ED from heart failure clinic c/o A fib RVR, HR 160s. Takes Elequis. Pt denies CP

## 2023-03-25 DIAGNOSIS — I5023 Acute on chronic systolic (congestive) heart failure: Secondary | ICD-10-CM | POA: Diagnosis not present

## 2023-03-25 DIAGNOSIS — I4891 Unspecified atrial fibrillation: Secondary | ICD-10-CM | POA: Diagnosis not present

## 2023-03-25 DIAGNOSIS — I10 Essential (primary) hypertension: Secondary | ICD-10-CM | POA: Diagnosis not present

## 2023-03-25 DIAGNOSIS — F5101 Primary insomnia: Secondary | ICD-10-CM | POA: Diagnosis not present

## 2023-03-25 DIAGNOSIS — E890 Postprocedural hypothyroidism: Secondary | ICD-10-CM | POA: Diagnosis not present

## 2023-03-27 ENCOUNTER — Encounter: Payer: Self-pay | Admitting: Obstetrics

## 2023-03-27 ENCOUNTER — Ambulatory Visit: Payer: Medicare Other | Admitting: Obstetrics

## 2023-03-27 VITALS — BP 119/88 | HR 66

## 2023-03-27 DIAGNOSIS — K5900 Constipation, unspecified: Secondary | ICD-10-CM | POA: Diagnosis not present

## 2023-03-27 DIAGNOSIS — Z8742 Personal history of other diseases of the female genital tract: Secondary | ICD-10-CM

## 2023-03-27 DIAGNOSIS — N3946 Mixed incontinence: Secondary | ICD-10-CM

## 2023-03-27 DIAGNOSIS — N819 Female genital prolapse, unspecified: Secondary | ICD-10-CM

## 2023-03-27 NOTE — Patient Instructions (Addendum)
 Continue Gemtesa  due to relief of urinary leakage.   Continue metamucil.   Please review gemtesa  with your cardiologist.   Please call Dr. Adrain Hopes if you experience any vaginal bleeding, continue vaginal estrogen 2x/week.

## 2023-03-27 NOTE — Assessment & Plan Note (Signed)
-   intermittent spotting since 2023 - TVUS 12/26/20 retroverted uterus 7.53 x 3.75 x 5.52cm, anterior submucosal fibroid 3.8 x 4.2 x 4.6cm (increased from 07/22/18 TVUS). Thin endometrial lining 0.29cm, right fundal 4.9 x 3.1 x 4.1cm with small amount of fluid. No free fluid or adnexal masses, right ovary not visualized. - reports pink tinge vaginal discharge after prolonged car rides, pt denies additional episodes since workup (spotting noted in 2023 per chart review after vaginal erythema noted) - discussed need for additional endometrial workup if she experiences any bleeding with endometrial biopsy or hysteroscopy D&C with Dr. Storm, pt denies bleeding since starting vaginal estrogen and pessary removal - continue vaginal estrogen for atrophy and h/o vaginal ulcer from pessary

## 2023-03-27 NOTE — Assessment & Plan Note (Signed)
-   01/07/23 POCT + leuk, culture with E. Coli resistant to Ampicillin, Augmentin , cefazolin, Zosyn, and bactrim. Rx macrobid  - prior bladder scan 42mL WNL - avoided pool exercises due to leakage, previously pending knee surgery after weight loss prior to recent A.fib diagnosis - We discussed the symptoms of overactive bladder (OAB), which include urinary urgency, urinary frequency, nocturia, with or without urge incontinence.  While we do not know the exact etiology of OAB, several treatment options exist. We discussed management including behavioral therapy (decreasing bladder irritants, urge suppression strategies, timed voids, bladder retraining), physical therapy, medication; for refractory cases posterior tibial nerve stimulation, sacral neuromodulation, and intravesical botulinum toxin injection.  For anticholinergic medications, we discussed the potential side effects of anticholinergics including dry eyes, dry mouth, constipation, cognitive impairment and urinary retention. For Beta-3 agonist medication, we discussed the potential side effect of elevated blood pressure which is more likely to occur in individuals with uncontrolled hypertension. - continue Gemtesa  due to relief, encouraged pt to review medication with cardiology. Reviewed study regarding slightly increased risk compared to placebo for mirabegron  (another beta 3 agonist) but lower than tolteradine (anti-cholinergic). Patient desires to review with cardiology since she is experiencing significant symptomatic relief from urinary symptoms and postpone discontinuation at this time. - For treatment of stress urinary incontinence,  non-surgical options include expectant management, weight loss, physical therapy, as well as a pessary.  Surgical options include a midurethral sling, Burch urethropexy, and transurethral injection of a bulking agent. - encouraged refitting for incontinence pessary after resolution of vaginal ulcer due to worsening  urinary leakage after ring with support pessary, will postpone per patient request until after treatment of A. Fib.

## 2023-03-27 NOTE — Assessment & Plan Note (Signed)
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 - removed pessary 01/07/23 due to ulceration at bilateral vaginal apices  - continue vaginal estrogen - encouraged splinting or pelvic wand use to ensure bladder and bowel emptying - declines pessary replacement at this time due to asymptomatic bulge and recent change in health status with Xarelto  for A.fib and pending cardiac procedures. Encouraged pt to return for placement if clinical change or sensation of incomplete emptying

## 2023-03-27 NOTE — Assessment & Plan Note (Signed)
-   For constipation, we reviewed the importance of a better bowel regimen.  We also discussed the importance of avoiding chronic straining, as it can exacerbate her pelvic floor symptoms; we discussed treating constipation and straining prior to surgery, as postoperative straining can lead to damage to the repair and recurrence of symptoms. We discussed initiating therapy with increasing fluid intake, fiber supplementation, stool softeners, and laxatives such as miralax.  - encouraged to continue fiber supplementation due to relief and minimize straining

## 2023-03-27 NOTE — Progress Notes (Signed)
 [facility] Return Visit  SUBJECTIVE  History of Present Illness: Cassandra Ho is a 78 y.o. female seen in follow-up for mixed urinary incontinence, pelvic organ porlapse, vaginal erosion with pessary use, vaginal discharge, nocturia, constipation and history of PMB. Plan at last visit was Gemtesa , vaginal estrogen.   Doing well on Gemtesa  ($300 for 90 days) Reports reduce pad use to 3/day down from 10/day, able to use smaller pads and pleased with symptomatic relief Pessary removed on 01/07/23 due to vaginal ulcer, resumed vaginal estrogen this week Admitted for A fib on 03/08/23 with recent URI, started on Eliquis , underwent TEE cardioversion 03/14/23, EF 30% Resolution of vaginal spotting since pessary removal and vaginal estrogen use, desires to postpone pessary replacement at this time due to recent clinical change Pending ablation and possible Watchman procedure.  Stopped Naprosyn with increased knee pain Nuswab + BV s/p flagyl  01/07/23. UA > 100K E. Coli resistant to Ampicillin, Augmentin , cefazolin, Zosyn, and bactrim. Rx macrobid  Cr 0.74 on 03/08/23  Using metamucil with relief and daily bowel movements  Past Medical History: Patient  has a past medical history of Allergic rhinitis, Arthritis, BCC (basal cell carcinoma of skin), Constipation, Dyslipidemia, Edema of both lower extremities, GERD (gastroesophageal reflux disease), H/O: pituitary tumor, Headache, High cholesterol, Hypertension, Hypothyroidism, Hypothyroidism (acquired), Joint pain, Morbid obesity (HCC), OSA (obstructive sleep apnea), Osteoarthritis, Toxic solitary thyroid  nodule, and Transfusion history.   Past Surgical History: She  has a past surgical history that includes Carpal tunnel release (Bilateral); Knee arthroscopy (Right); Breast surgery; Ankle Arthrotomy (Left); Hip fracture surgery; Colonoscopy with propofol  (N/A, 06/15/2014); Breast biopsy (Bilateral); torn meniscus; Dilation and curettage  of uterus (1973); Dilation and curettage of uterus (1974); Dilation and curettage of uterus (1975); D&C (1977); Dilation and curettage of uterus (1978); Dilation and curettage of uterus (1991); IUD removal (2000); TRANSESOPHAGEAL ECHOCARDIOGRAM (N/A, 03/14/2023); and CARDIOVERSION (N/A, 03/14/2023).   Medications: She has a current medication list which includes the following prescription(s): amiodarone , apixaban , conjugated estrogens , empagliflozin , famotidine , furosemide , levothyroxine , loratadine , metoprolol  succinate, potassium chloride  sa, psyllium, rosuvastatin , sacubitril -valsartan , and gemtesa .   Allergies: Patient has no known allergies.   Social History: Patient  reports that she has quit smoking. Her smoking use included cigarettes. She has never used smokeless tobacco. She reports current alcohol use. She reports that she does not use drugs.     OBJECTIVE     Physical Exam: Vitals:   03/27/23 1523  BP: 119/88  Pulse: 66   Gen: No apparent distress, A&O x 3.  Detailed Urogynecologic Evaluation:  Deferred.       ASSESSMENT AND PLAN    [name] is a 78 y.o. with:  1. Urinary incontinence, mixed   2. Female genital prolapse, unspecified type   3. History of postmenopausal bleeding   4. Constipation, unspecified constipation type     Urinary incontinence, mixed Assessment & Plan: - 01/07/23 POCT + leuk, culture with E. Coli resistant to Ampicillin, Augmentin , cefazolin, Zosyn, and bactrim. Rx macrobid  - prior bladder scan 42mL WNL - avoided pool exercises due to leakage, previously pending knee surgery after weight loss prior to recent A.fib diagnosis - We discussed the symptoms of overactive bladder (OAB), which include urinary urgency, urinary frequency, nocturia, with or without urge incontinence.  While we do not know the exact etiology of OAB, several treatment options exist. We discussed management including behavioral therapy (decreasing bladder irritants,  urge suppression strategies, timed voids, bladder retraining), physical therapy, medication; for refractory cases posterior tibial nerve  stimulation, sacral neuromodulation, and intravesical botulinum toxin injection.  For anticholinergic medications, we discussed the potential side effects of anticholinergics including dry eyes, dry mouth, constipation, cognitive impairment and urinary retention. For Beta-3 agonist medication, we discussed the potential side effect of elevated blood pressure which is more likely to occur in individuals with uncontrolled hypertension. - continue Gemtesa  due to relief, encouraged pt to review medication with cardiology. Reviewed study regarding slightly increased risk compared to placebo for mirabegron  (another beta 3 agonist) but lower than tolteradine (anti-cholinergic). Patient desires to review with cardiology since she is experiencing significant symptomatic relief from urinary symptoms and postpone discontinuation at this time. - For treatment of stress urinary incontinence,  non-surgical options include expectant management, weight loss, physical therapy, as well as a pessary.  Surgical options include a midurethral sling, Burch urethropexy, and transurethral injection of a bulking agent. - encouraged refitting for incontinence pessary after resolution of vaginal ulcer due to worsening urinary leakage after ring with support pessary, will postpone per patient request until after treatment of A. Fib.   Female genital prolapse, unspecified type Assessment & Plan: - size 2 ring with support pessary increased to size 3 in 05/2022 - For treatment of pelvic organ prolapse, we discussed options for management including expectant management, conservative management, and surgical management, such as Kegels, a pessary, pelvic floor physical therapy, and specific surgical procedures. - removed pessary 01/07/23 due to ulceration at bilateral vaginal apices  - continue vaginal  estrogen - encouraged splinting or pelvic wand use to ensure bladder and bowel emptying - declines pessary replacement at this time due to asymptomatic bulge and recent change in health status with Xarelto  for A.fib and pending cardiac procedures. Encouraged pt to return for placement if clinical change or sensation of incomplete emptying   History of postmenopausal bleeding Assessment & Plan: - intermittent spotting since 2023 - TVUS 12/26/20 retroverted uterus 7.53 x 3.75 x 5.52cm, anterior submucosal fibroid 3.8 x 4.2 x 4.6cm (increased from 07/22/18 TVUS). Thin endometrial lining 0.29cm, right fundal 4.9 x 3.1 x 4.1cm with small amount of fluid. No free fluid or adnexal masses, right ovary not visualized. - reports pink tinge vaginal discharge after prolonged car rides, pt denies additional episodes since workup (spotting noted in 2023 per chart review after vaginal erythema noted) - discussed need for additional endometrial workup if she experiences any bleeding with endometrial biopsy or hysteroscopy D&C with Dr. Storm, pt denies bleeding since starting vaginal estrogen and pessary removal - continue vaginal estrogen for atrophy and h/o vaginal ulcer from pessary   Constipation, unspecified constipation type Assessment & Plan: - For constipation, we reviewed the importance of a better bowel regimen.  We also discussed the importance of avoiding chronic straining, as it can exacerbate her pelvic floor symptoms; we discussed treating constipation and straining prior to surgery, as postoperative straining can lead to damage to the repair and recurrence of symptoms. We discussed initiating therapy with increasing fluid intake, fiber supplementation, stool softeners, and laxatives such as miralax.  - encouraged to continue fiber supplementation due to relief and minimize straining   Time spent: I spent 16 minutes dedicated to the care of this patient on the date of this encounter to include  pre-visit review of records, face-to-face time with the patient discussing mixed urinary incontinence, pelvic organ prolapse, history of PMB, constipation, and post visit documentation.   RTC in 3 months or sooner if clinical change  Lianne ONEIDA Gillis, MD

## 2023-04-08 ENCOUNTER — Other Ambulatory Visit: Payer: Self-pay | Admitting: *Deleted

## 2023-04-08 MED ORDER — FUROSEMIDE 40 MG PO TABS: 40.0000 mg | ORAL_TABLET | Freq: Two times a day (BID) | ORAL | 2 refills | Status: DC

## 2023-04-08 MED ORDER — EMPAGLIFLOZIN 10 MG PO TABS: 10.0000 mg | ORAL_TABLET | Freq: Every day | ORAL | 2 refills | Status: DC

## 2023-04-08 MED ORDER — APIXABAN 5 MG PO TABS: 5.0000 mg | ORAL_TABLET | Freq: Two times a day (BID) | ORAL | 2 refills | Status: DC

## 2023-04-08 MED ORDER — POTASSIUM CHLORIDE CRYS ER 20 MEQ PO TBCR: 20.0000 meq | EXTENDED_RELEASE_TABLET | Freq: Every day | ORAL | 2 refills | Status: DC

## 2023-04-08 MED ORDER — SACUBITRIL-VALSARTAN 24-26 MG PO TABS: 1.0000 | ORAL_TABLET | Freq: Two times a day (BID) | ORAL | 2 refills | Status: DC

## 2023-04-08 MED ORDER — METOPROLOL SUCCINATE ER 25 MG PO TB24
25.0000 mg | ORAL_TABLET | Freq: Every day | ORAL | 2 refills | Status: DC
Start: 2023-04-08 — End: 2023-04-15

## 2023-04-11 ENCOUNTER — Ambulatory Visit: Payer: Medicare Other | Attending: Cardiology | Admitting: Cardiology

## 2023-04-11 ENCOUNTER — Encounter: Payer: Self-pay | Admitting: Cardiology

## 2023-04-11 ENCOUNTER — Telehealth: Payer: Self-pay | Admitting: *Deleted

## 2023-04-11 VITALS — BP 122/78 | HR 74 | Ht 66.0 in | Wt 285.4 lb

## 2023-04-11 DIAGNOSIS — I5023 Acute on chronic systolic (congestive) heart failure: Secondary | ICD-10-CM

## 2023-04-11 DIAGNOSIS — I5022 Chronic systolic (congestive) heart failure: Secondary | ICD-10-CM | POA: Diagnosis not present

## 2023-04-11 DIAGNOSIS — G4733 Obstructive sleep apnea (adult) (pediatric): Secondary | ICD-10-CM | POA: Diagnosis not present

## 2023-04-11 DIAGNOSIS — Z79899 Other long term (current) drug therapy: Secondary | ICD-10-CM | POA: Diagnosis not present

## 2023-04-11 DIAGNOSIS — I4891 Unspecified atrial fibrillation: Secondary | ICD-10-CM | POA: Diagnosis not present

## 2023-04-11 MED ORDER — SPIRONOLACTONE 25 MG PO TABS: 12.5000 mg | ORAL_TABLET | Freq: Every day | ORAL | 3 refills | Status: DC

## 2023-04-11 MED ORDER — FUROSEMIDE 40 MG PO TABS: 40.0000 mg | ORAL_TABLET | Freq: Every day | ORAL | 3 refills | Status: DC

## 2023-04-11 NOTE — Patient Instructions (Addendum)
 Medication Instructions:  Your physician has recommended you make the following change in your medication:  STOP: Potassium  START: spironolactone 12.5 mg by mouth once daily. You will be taking half a pill  DECREASE: furosemide (Lasix) to 40 mg by mouth once daily  *If you need a refill on your cardiac medications before your next appointment, please call your pharmacy*   Lab Work: IN 1 WEEK: CMP, TSH  If you have labs (blood work) drawn today and your tests are completely normal, you will receive your results only by: MyChart Message (if you have MyChart) OR A paper copy in the mail If you have any lab test that is abnormal or we need to change your treatment, we will call you to review the results.   Testing/Procedures: IN 4 MONTHS: Your physician has requested that you have an echocardiogram. Echocardiography is a painless test that uses sound waves to create images of your heart. It provides your doctor with information about the size and shape of your heart and how well your heart's chambers and valves are working. This procedure takes approximately one hour. There are no restrictions for this procedure. Please do NOT wear cologne, perfume, aftershave, or lotions (deodorant is allowed). Please arrive 15 minutes prior to your appointment time.  Please note: We ask at that you not bring children with you during ultrasound (echo/ vascular) testing. Due to room size and safety concerns, children are not allowed in the ultrasound rooms during exams. Our front office staff cannot provide observation of children in our lobby area while testing is being conducted. An adult accompanying a patient to their appointment will only be allowed in the ultrasound room at the discretion of the ultrasound technician under special circumstances. We apologize for any inconvenience.   Your physician has requested that you perform an in home Sleep Study.  Your physician has referred you to see the  Atrial Fibrillation Clinic.   Follow-Up: At [facility], you and your health needs are our priority.  As part of our continuing mission to provide you with exceptional heart care, we have created designated Provider Care Teams.  These Care Teams include your primary Cardiologist (physician) and Advanced Practice Providers (APPs -  Physician Assistants and Nurse Practitioners) who all work together to provide you with the care you need, when you need it.   Your next appointment:   4 month(s)  Provider:   Armanda Magic, MD  or Perlie Gold, PA-C

## 2023-04-11 NOTE — Telephone Encounter (Signed)
 Perlie Gold, PAC ORDERED ITAMAR STUDY.  Patient agreement reviewed and signed on 04/11/2023.  WatchPAT issued to patient on 04/11/2023 by Danielle Rankin, CMA. Patient aware to not open the WatchPAT box until contacted with the activation PIN. Patient profile initialized in CloudPAT on 04/11/2023 by Danielle Rankin, CMA. Device serial number: [id number]  Please list Reason for Call as Advice Only and type "WatchPAT issued to patient" in the comment box.

## 2023-04-11 NOTE — Progress Notes (Signed)
 Cardiology Office Note:   Date:  04/11/2023  ID:  Cassandra Ho, DOB 17-Jan-1946, MRN [id number] PCP: Collene Mares, PA  [facility] Providers Cardiologist:  None    History of Present Illness:   Discussed the use of AI scribe software for clinical note transcription with the patient, who gave verbal consent to proceed.  Cassandra Ho is a 78 year old female with chronic heart failure and atrial fibrillation who presents for follow-up after recent cardioversion.  She has a history of paroxysmal atrial fibrillation and was recently admitted on January 17th for atrial fibrillation with rapid ventricular response. She underwent cardioversion, which restored sinus rhythm. However, during a follow-up on January 31st, she was found to be in recurrent atrial fibrillation with rapid ventricular response and was sent to the emergency department for another successful cardioversion. She was started on amiodarone 200 mg twice daily to maintain sinus rhythm. She feels well heart-wise since the second cardioversion, with no shortness of breath or palpitations.  She has chronic heart failure with reduced ejection fraction, as evidenced by an echocardiogram showing a left ventricular ejection fraction of 30-35%. She is on guideline-directed medical therapy, including Toprol XL 25 mg, Entresto, and an SGLT2 inhibitor. Her fluid status is stable, and she is euvolemic. Blood pressure at home has been stable, with readings typically in the low hundreds over 60s to 70s, and she denies any dizziness or lightheadedness.  She is currently on Lasix 80 mg daily and a potassium supplement, which she finds difficult to swallow. She reports no issues with Eliquis 5 mg twice daily, and no bleeding or gastrointestinal side effects from amiodarone. Her blood pressure is generally well-controlled with occasional readings in the low hundreds.  She experiences significant knee pain, which worsens with  exertion, and has been taken off ibuprofen and Naprosyn due to their contraindications with her current medications. She describes the knee pain as severe, bringing her to tears several times a day, and it limits her physical activities, although she manages to perform some household tasks with difficulty.  She has a history of sleep apnea, previously managed with CPAP, but reports improved sleep quality recently without snoring. She has not taken trazodone to aid in sleep since her recent cardioversion. She monitors her blood pressure regularly and reports it tends to be lowest in the morning.      Today patient denies chest pain, shortness of breath, lower extremity edema, fatigue, palpitations, melena, hematuria, hemoptysis, diaphoresis, weakness, presyncope, syncope, orthopnea, and PND.   Studies Reviewed:    EKG:   EKG Interpretation Date/Time:  Thursday April 11 2023 11:06:58 EST Ventricular Rate:  74 PR Interval:  192 QRS Duration:  104 QT Interval:  418 QTC Calculation: 463 R Axis:   -14  Text Interpretation: Normal sinus rhythm Normal ECG Confirmed by Perlie Gold [phone]) on 04/11/2023 11:17:12 AM    03/10/23 TTE  IMPRESSIONS     1. Left ventricular ejection fraction, by estimation, is 30%. The left ventricle has moderately decreased function. The left ventricle demonstrates global hypokinesis. Left ventricular diastolic function could not be evaluated. 2. Right ventricular systolic function is normal. The right ventricular size is normal. 3. The mitral valve was not well visualized. Mild mitral valve regurgitation. No evidence of mitral stenosis. 4. The aortic valve was not well visualized. Aortic valve regurgitation is not visualized. 5. The inferior vena cava is dilated in size with >50% respiratory variability, suggesting right atrial pressure of 8 mmHg.  Comparison(s): No prior Echocardiogram.   FINDINGS Left Ventricle: Left ventricular ejection fraction, by  estimation, is 30%. The left ventricle has moderately decreased function. The left ventricle demonstrates global hypokinesis. Definity contrast agent was given IV to delineate the left ventricular endocardial borders. The left ventricular internal cavity size was normal in size. Suboptimal image quality limits for assessment of left ventricular hypertrophy. Left ventricular diastolic function could not be evaluated due to atrial fibrillation. Left ventricular diastolic function could not be evaluated.   Right Ventricle: The right ventricular size is normal. No increase in right ventricular wall thickness. Right ventricular systolic function is normal.   Left Atrium: Left atrial size was normal in size.   Right Atrium: Right atrial size was normal in size.   Pericardium: Trivial pericardial effusion is present. The pericardial effusion is surrounding the apex. Presence of epicardial fat layer.   Mitral Valve: The mitral valve was not well visualized. Mild mitral valve regurgitation. No evidence of mitral valve stenosis.   Tricuspid Valve: The tricuspid valve is not well visualized. Tricuspid valve regurgitation is trivial. No evidence of tricuspid stenosis.   Aortic Valve: The aortic valve was not well visualized. Aortic valve regurgitation is not visualized. Aortic valve peak gradient measures 12.1 mmHg.   Pulmonic Valve: The pulmonic valve was not well visualized. Pulmonic valve regurgitation is not visualized. No evidence of pulmonic stenosis.   Aorta: The aortic root and ascending aorta are structurally normal, with no evidence of dilitation.   Venous: The inferior vena cava is dilated in size with greater than 50% respiratory variability, suggesting right atrial pressure of 8 mmHg.   IAS/Shunts: The atrial septum is grossly normal.  Risk Assessment/Calculations:    CHA2DS2-VASc Score = 5   This indicates a 7.2% annual risk of stroke. The patient's score is based upon: CHF History:  1 HTN History: 1 Diabetes History: 0 Stroke History: 0 Vascular Disease History: 0 Age Score: 2 Gender Score: 1        STOP-Bang Score:  6       Physical Exam:   VS:  BP 122/78   Pulse 74   Ht 5\' 6"  (1.676 m)   Wt 285 lb 6.4 oz (129.5 kg)   SpO2 93%   BMI 46.06 kg/m    Wt Readings from Last 3 Encounters:  04/11/23 285 lb 6.4 oz (129.5 kg)  03/22/23 287 lb 4.8 oz (130.3 kg)  03/15/23 291 lb 14.2 oz (132.4 kg)     Physical Exam Vitals reviewed.  Constitutional:      Appearance: Normal appearance.  HENT:     Head: Normocephalic.     Nose: Nose normal.  Eyes:     Pupils: Pupils are equal, round, and reactive to light.  Cardiovascular:     Rate and Rhythm: Normal rate and regular rhythm.     Pulses: Normal pulses.     Heart sounds: Normal heart sounds. No murmur heard.    No friction rub. No gallop.  Pulmonary:     Effort: Pulmonary effort is normal.     Breath sounds: Normal breath sounds.  Abdominal:     General: Abdomen is flat.  Musculoskeletal:     Right lower leg: No edema.     Left lower leg: No edema.  Skin:    General: Skin is warm and dry.     Capillary Refill: Capillary refill takes less than 2 seconds.  Neurological:     General: No focal deficit present.  Mental Status: She is alert and oriented to person, place, and time.  Psychiatric:        Mood and Affect: Mood normal.        Behavior: Behavior normal.        Thought Content: Thought content normal.        Judgment: Judgment normal.      ASSESSMENT AND PLAN:      Chronic Heart Failure with Reduced Ejection Fraction LVEF ~30% on January 2025 TTE. 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 Euvolemic on examination today. Currently on guideline directed medical therapy including Toprol XL 25mg , Entresto 24/26mg  BID, and Jardiance 10mg . -Add Spironolactone 12.5mg  daily. -Reduce Lasix from 40mg  BID to 40mg  daily. Discussed taking 40mg  daily with PRN extra dosing if she has weight gain on  daily reduced dose.  -Continue Entresto 24-26mg  BID, Toprol XL 25mg  daily, Jardiance 10mg   -Plan to repeat echocardiogram in 3-4 months to reassess left ventricular ejection fraction.  Atrial Fibrillation Currently in sinus rhythm following recent cardioversion. On Amiodarone 200mg  daily, Eliquis 5mg  BID, and Toprol XL 25mg  BID. -Continue current regimen. Would prefer to avoid long-term Amiodarone with thyroid dysfunction. Consider discontinuing vs transition to alternative anti-arrhythmic after ~3 month course.  -Refer to AFib clinic for consideration of alternative antiarrhythmic regimen. -Arrange for home sleep study to assess for sleep apnea. STOP-BANG 6.  Thyroid Dysfunction On Levothyroxine.  -Check TSH and liver function tests.  Knee Pain Severe pain, previously managed with NSAIDs which were stopped due to potential interaction with Eliquis and potential worsening of heart failure. -Consider referral to appropriate specialist for alternative pain management strategies.  Hypertension Blood pressure readings in the low hundreds at home, 122/58mmHg today. No dizziness/lightheadedness reported.  -Monitor blood pressure with reduced Lasix and new start Spironolactone 12.5mg ..             Signed, Perlie Gold, PA-C

## 2023-04-15 ENCOUNTER — Encounter (HOSPITAL_COMMUNITY): Payer: Self-pay | Admitting: Cardiology

## 2023-04-15 ENCOUNTER — Ambulatory Visit (HOSPITAL_COMMUNITY)
Admission: RE | Admit: 2023-04-15 | Discharge: 2023-04-15 | Disposition: A | Discharge status: Home / Self Care | Payer: Medicare Other | Source: Ambulatory Visit | Attending: Cardiology | Admitting: Cardiology

## 2023-04-15 VITALS — BP 120/78 | HR 134

## 2023-04-15 DIAGNOSIS — I252 Old myocardial infarction: Secondary | ICD-10-CM | POA: Insufficient documentation

## 2023-04-15 DIAGNOSIS — Z79899 Other long term (current) drug therapy: Secondary | ICD-10-CM | POA: Diagnosis not present

## 2023-04-15 DIAGNOSIS — R002 Palpitations: Secondary | ICD-10-CM | POA: Insufficient documentation

## 2023-04-15 DIAGNOSIS — I5022 Chronic systolic (congestive) heart failure: Secondary | ICD-10-CM | POA: Diagnosis not present

## 2023-04-15 DIAGNOSIS — M170 Bilateral primary osteoarthritis of knee: Secondary | ICD-10-CM | POA: Diagnosis not present

## 2023-04-15 DIAGNOSIS — I4891 Unspecified atrial fibrillation: Secondary | ICD-10-CM | POA: Diagnosis not present

## 2023-04-15 DIAGNOSIS — Z87891 Personal history of nicotine dependence: Secondary | ICD-10-CM | POA: Diagnosis not present

## 2023-04-15 DIAGNOSIS — I5032 Chronic diastolic (congestive) heart failure: Secondary | ICD-10-CM

## 2023-04-15 DIAGNOSIS — E785 Hyperlipidemia, unspecified: Secondary | ICD-10-CM | POA: Diagnosis not present

## 2023-04-15 DIAGNOSIS — Z7984 Long term (current) use of oral hypoglycemic drugs: Secondary | ICD-10-CM | POA: Diagnosis not present

## 2023-04-15 DIAGNOSIS — I480 Paroxysmal atrial fibrillation: Secondary | ICD-10-CM | POA: Insufficient documentation

## 2023-04-15 DIAGNOSIS — Z7901 Long term (current) use of anticoagulants: Secondary | ICD-10-CM | POA: Insufficient documentation

## 2023-04-15 DIAGNOSIS — Z79890 Hormone replacement therapy (postmenopausal): Secondary | ICD-10-CM | POA: Insufficient documentation

## 2023-04-15 DIAGNOSIS — E669 Obesity, unspecified: Secondary | ICD-10-CM | POA: Insufficient documentation

## 2023-04-15 DIAGNOSIS — E039 Hypothyroidism, unspecified: Secondary | ICD-10-CM | POA: Insufficient documentation

## 2023-04-15 DIAGNOSIS — G4733 Obstructive sleep apnea (adult) (pediatric): Secondary | ICD-10-CM | POA: Insufficient documentation

## 2023-04-15 DIAGNOSIS — I110 Hypertensive heart disease with heart failure: Secondary | ICD-10-CM | POA: Insufficient documentation

## 2023-04-15 LAB — COMPREHENSIVE METABOLIC PANEL
ALT: 14 U/L (ref 0–44)
AST: 17 U/L (ref 15–41)
Albumin: 3.8 g/dL (ref 3.5–5.0)
Alkaline Phosphatase: 59 U/L (ref 38–126)
Anion gap: 9 (ref 5–15)
BUN: 17 mg/dL (ref 8–23)
CO2: 28 mmol/L (ref 22–32)
Calcium: 9.5 mg/dL (ref 8.9–10.3)
Chloride: 105 mmol/L (ref 98–111)
Creatinine, Ser: 0.79 mg/dL (ref 0.44–1.00)
GFR, Estimated: >60 mL/min (ref >60)
Glucose, Bld: 113 mg/dL — ABNORMAL HIGH (ref 70–99)
Potassium: 4.3 mmol/L (ref 3.5–5.1)
Sodium: 142 mmol/L (ref 135–145)
Total Bilirubin: 0.5 mg/dL (ref 0.0–1.2)
Total Protein: 6.7 g/dL (ref 6.5–8.1)

## 2023-04-15 LAB — CBC
HCT: 46.9 % — ABNORMAL HIGH (ref 36.0–46.0)
Hemoglobin: 15.2 g/dL — ABNORMAL HIGH (ref 12.0–15.0)
MCH: 29.6 pg (ref 26.0–34.0)
MCHC: 32.4 g/dL (ref 30.0–36.0)
MCV: 91.4 fL (ref 80.0–100.0)
Platelets: 207 10*3/uL (ref 150–400)
RBC: 5.13 MIL/uL — ABNORMAL HIGH (ref 3.87–5.11)
RDW: 14.8 % (ref 11.5–15.5)
WBC: 6.3 10*3/uL (ref 4.0–10.5)
nRBC: 0 % (ref 0.0–0.2)

## 2023-04-15 LAB — TSH: TSH: 1.976 u[IU]/mL (ref 0.350–4.500)

## 2023-04-15 LAB — BRAIN NATRIURETIC PEPTIDE: B Natriuretic Peptide: 241.9 pg/mL — ABNORMAL HIGH (ref 0.0–100.0)

## 2023-04-15 MED ORDER — SPIRONOLACTONE 25 MG PO TABS: 25.0000 mg | ORAL_TABLET | Freq: Every day | ORAL | 3 refills | Status: AC

## 2023-04-15 MED ORDER — FUROSEMIDE 40 MG PO TABS: 40.0000 mg | ORAL_TABLET | Freq: Two times a day (BID) | ORAL | 3 refills | Status: DC

## 2023-04-15 MED ORDER — METOPROLOL SUCCINATE ER 25 MG PO TB24: 25.0000 mg | ORAL_TABLET | Freq: Two times a day (BID) | ORAL | 3 refills | Status: DC

## 2023-04-15 MED ORDER — AMIODARONE HCL 200 MG PO TABS: 200.0000 mg | ORAL_TABLET | Freq: Two times a day (BID) | ORAL | 3 refills | Status: DC

## 2023-04-15 NOTE — Patient Instructions (Signed)
 INCREASE Amiodarone to 200 mg Twice daily  INCREASE Toprol XL to 25 mg Twice daily  INCREASE Lasix to 40 mg Twice daily  INCREASE Spironolactone to 25 mg daily.  Labs done today, your results will be available in MyChart, we will contact you for abnormal readings.  Repeat blood work in 10 days.  You have been referred to the Electrophysiologist. They will call you to arrange your appointment.  Your physician has requested that you have a cardiac MRI. Cardiac MRI uses a computer to create images of your heart as its beating, producing both still and moving pictures of your heart and major blood vessels. For further information please visit [email or web address]. Please follow the instruction sheet given to you today for more information. YOU WILL BE CALLED TO HAVE THIS TEST ARRANGED.   Your physician recommends that you schedule a follow-up appointment as scheduled.  If you have any questions or concerns before your next appointment please send [place] a message through [place] or call our office at [phone].    TO LEAVE A MESSAGE FOR THE NURSE SELECT OPTION 2, PLEASE LEAVE A MESSAGE INCLUDING: YOUR NAME DATE OF BIRTH CALL BACK NUMBER REASON FOR CALL**this is important as we prioritize the call backs  YOU WILL RECEIVE A CALL BACK THE SAME DAY AS LONG AS YOU CALL BEFORE 4:00 PM  At the [facility], you and your health needs are our priority. As part of our continuing mission to provide you with exceptional heart care, we have created designated Provider Care Teams. These Care Teams include your primary Cardiologist (physician) and Advanced Practice Providers (APPs- Physician Assistants and Nurse Practitioners) who all work together to provide you with the care you need, when you need it.   You may see any of the following providers on your designated Care Team at your next follow up: Dr Arvilla Meres Dr Marca Ancona Dr. Dorthula Nettles Dr. Clearnce Hasten Amy  Filbert Schilder, NP Robbie Lis, [place] [facility] [place], [place] Brynda Peon, NP [place] Lee, NP Clarisa Kindred, NP Karle Plumber, PharmD Enos Fling, PharmD   Please be sure to bring in all your medications bottles to every appointment.    Thank you for choosing [facility]

## 2023-04-16 NOTE — Progress Notes (Signed)
 Primary Care: Collene Mares, [place] HF Cardiology: Dr. Shirlee Latch  Chief Complaint: CHF, AF/RVR  HPI: 78 y.o. referred for evaluation of CHF from [facility] clinic.   Patient has a history of HTN, HLD, atrial fibrillation, OSA, hypothyroidism, pituitary tumor, and chronic HFrEF. Last cigarette 50 years ago. .   Admitted 03/08/23 with atrial fibrillation/RVR, new diagnosis.  Underwent cardioversion. Echo showed reduced LVEF 30-35%, normal RV, mild MR.  Placed on 25 mg Toprol XL daily. Started on GDMT.   She was seen in the office 03/22/23 and was noted to be back in AF/RVR. She was sent to the ER and cardioverted in the ER, then sent home. Amiodarone was started.   Patient is in atrial fibrillation with RVR in the office today.  She says that her HR was in the 70s earlier today and just increased in the last couple of hours.  She checks HR daily, and thinks she has been in NSR since her DCCV on 03/22/23 (prior to today). She has bilateral severe knee OA that limits her ambulation.  She uses a wheelchair most of the time outside the house, can walk with 2 canes in her house.  No dyspnea walking around the house with her canes. No chest pain.  She feels some palpitations currently with AF/RVR.  This started in the last couple hours.  No lightheadedness. She has been avoiding caffeine.  Of note, her Lasix was recently cut back from 40 mg bid to 40 mg daily.  She has noted increased peripheral edema.   ECG (personally reviewed): Atrial fibrillation with RVR 134 msec   Labs (1/25): BNP 179, K 4.3, creatinine 0.88, LFTs normal, TSH normal  PMH: 1. Pituitary tumor: Remote.  2. 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  3. OSA 4. HTN 5. Atrial fibrillation: Paroxysmal.  - TEE-guided DCCV 03/14/23.  - DCCV 03/22/23 6. Chronic systolic CHF: Echo (1/25) with LVEF 30-35%, RV normal, mild MR 7. Hyperlipidemia 8. Obesity  Current Outpatient Medications  Medication  Sig Dispense Refill   apixaban (ELIQUIS) 5 MG TABS tablet Take 1 tablet (5 mg total) by mouth 2 (two) times daily. 60 tablet 2   conjugated estrogens (PREMARIN) vaginal cream Place 1 Applicatorful vaginally 2 (two) times a week. Place 0.5g nightly for two weeks then twice a week after 30 g 2   empagliflozin (JARDIANCE) 10 MG TABS tablet Take 1 tablet (10 mg total) by mouth daily. 30 tablet 2   famotidine (PEPCID) 20 MG tablet Take 20 mg by mouth daily as needed for heartburn or indigestion.     levothyroxine (SYNTHROID) 125 MCG tablet Take 1 tablet (125 mcg total) by mouth daily before breakfast. 30 tablet 0   loratadine (CLARITIN) 10 MG tablet Take 10 mg by mouth daily as needed for allergies.     psyllium (HYDROCIL/METAMUCIL) 95 % PACK Take 1 packet by mouth daily.     rosuvastatin (CRESTOR) 20 MG tablet Take 1 tablet (20 mg total) by mouth daily. Please make overdue appt with Dr. Mayford Knife before anymore refills. 2nd attempt 15 tablet 0   sacubitril-valsartan (ENTRESTO) 24-26 MG Take 1 tablet by mouth 2 (two) times daily. 60 tablet 2   traZODone (DESYREL) 50 MG tablet Take 50 mg by mouth at bedtime as needed for sleep.     Vibegron (GEMTESA) 75 MG TABS Take 1 tablet (75 mg total) by mouth daily. 28 tablet 0   amiodarone (PACERONE) 200 MG tablet Take 1 tablet (  200 mg total) by mouth 2 (two) times daily. 90 tablet 3   furosemide (LASIX) 40 MG tablet Take 1 tablet (40 mg total) by mouth 2 (two) times daily. 90 tablet 3   metoprolol succinate (TOPROL-XL) 25 MG 24 hr tablet Take 1 tablet (25 mg total) by mouth 2 (two) times daily. 180 tablet 3   spironolactone (ALDACTONE) 25 MG tablet Take 1 tablet (25 mg total) by mouth daily. 90 tablet 3   No current facility-administered medications for this encounter.    No Known Allergies    Social History   Socioeconomic History   Marital status: Married    Spouse name: Jake Shark   Number of children: 3   Years of education: Not on file   Highest  education level: Bachelor's degree (e.g., BA, AB, BS)  Occupational History   Occupation: Retired  Tobacco Use   Smoking status: Former    Types: Cigarettes   Smokeless tobacco: Never   Tobacco comments:    Quit '78  Vaping Use   Vaping status: Never Used  Substance and Sexual Activity   Alcohol use: Yes    Comment: ocass   Drug use: No   Sexual activity: Not Currently    Partners: Male    Birth control/protection: Abstinence  Other Topics Concern   Not on file  Social History Narrative   ** Merged History Encounter **       Social Drivers of Health   Financial Resource Strain: Low Risk  (03/12/2023)   Overall Financial Resource Strain (CARDIA)    Difficulty of Paying Living Expenses: Not very hard  Food Insecurity: No Food Insecurity (03/10/2023)   Hunger Vital Sign    Worried About Running Out of Food in the Last Year: Never true    Ran Out of Food in the Last Year: Never true  Transportation Needs: No Transportation Needs (03/10/2023)   PRAPARE - Administrator, Civil Service (Medical): No    Lack of Transportation (Non-Medical): No  Physical Activity: Not on file  Stress: Not on file  Social Connections: Unknown (03/10/2023)   Social Connection and Isolation Panel [NHANES]    Frequency of Communication with Friends and Family: More than three times a week    Frequency of Social Gatherings with Friends and Family: More than three times a week    Attends Religious Services: Patient declined    Database administrator or Organizations: Patient declined    Attends Banker Meetings: Patient declined    Marital Status: Patient declined  Intimate Partner Violence: Not At Risk (03/10/2023)   Humiliation, Afraid, Rape, and Kick questionnaire    Fear of Current or Ex-Partner: No    Emotionally Abused: No    Physically Abused: No    Sexually Abused: No      Family History  Problem Relation Age of Onset   Thyroid cancer Mother    Lung cancer Mother     Melanoma Mother    Diabetes Mother    Thyroid disease Mother    Alcohol abuse Mother    Bladder Cancer Mother    Alcoholism Father    Cirrhosis Father    High blood pressure Father    High Cholesterol Father    Fibromyalgia Sister    Breast cancer Sister        30s   Breast cancer Maternal Grandmother        80s   Breast cancer Paternal Grandmother  60s   Breast cancer Daughter 42   Breast cancer Maternal Aunt        60s    Vitals:   04/15/23 1353  BP: 120/78  Pulse: (!) 134  SpO2: 96%   Wt Readings from Last 3 Encounters:  04/11/23 129.5 kg (285 lb 6.4 oz)  03/22/23 130.3 kg (287 lb 4.8 oz)  03/15/23 132.4 kg (291 lb 14.2 oz)    PHYSICAL EXAM: General: NAD Neck: JVP 8-9 cm with HJR, no thyromegaly or thyroid nodule.  Lungs: Clear to auscultation bilaterally with normal respiratory effort. CV: Nondisplaced PMI.  Heart mildly tachy, irregular S1/S2, no S3/S4, no murmur.  1+ ankle  edema.  No carotid bruit.  Normal pedal pulses.  Abdomen: Soft, nontender, no hepatosplenomegaly, no distention.  Skin: Intact without lesions or rashes.  Neurologic: Alert and oriented x 3.  Psych: Normal affect. Extremities: No clubbing or cyanosis.  HEENT: Normal.   ASSESSMENT & PLAN: 1. Chronic systolic CHF: Echo in 1/25 showed EF 30-35%, normal RV, mild MR. 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  No FH of cardiomyopathy, no substance abuse.  No chest pain or CAD history. She is back in AF/RVR today and is volume overloaded on exam. Lasix was recently decreased.  NYHA class II-III (confounded by severe knee OA).  - Increase Lasix back to 40 mg bid.  BMET/BNP today, BMET in 10 days.  - Increase spironolactone to 25 mg daily.  - Increase Toprol XL to 25 mg bid - Continue Entresto 24/26 bid - Continue Jardiance 10 mg daily.  - I will arrange for cardiac MRI to reassess EF and to assess for infiltrative disease/prior MI.  If EF does not improve to normal range while in  NSR, she will need ischemic evaluation with RHC/LHC.   2.  Atrial fibrillation: Paroxysmal.  Diagnosed in 1/25. DCCV to NSR 03/14/23 and again 03/22/23. She is now on amiodarone.  She went into AF/RVR a couple of hours earlier today (had been in NSR based on HR 70s).  - Increase amiodarone back to 200 mg bid. Check LFTs, TSH.  She will need a regular eye exam.  - Increase Toprol XL to 25 mg bid for rate control.  - Continue Eliquis 5 mg bid, she has not missed doses.  - I will arrange for ECG later this week.  If she still has not come out of AF, she will need DCCV.  - Refer to EP for consideration of AF ablation.  - Set up for sleep study.  3.Hypothyroid: Continue levothyroxine.  4. HTN: BP not elevated.  5.OSA: Previously diagnosed with OSA. Needs another sleep study.   I spent 55 minutes reviewing data, interviewing patient, and organizing the orders/followup.   Follow up with me in 3 wks.   Marca Ancona  04/16/2023

## 2023-04-19 ENCOUNTER — Ambulatory Visit (HOSPITAL_COMMUNITY)
Admission: RE | Admit: 2023-04-19 | Discharge: 2023-04-19 | Disposition: A | Discharge status: Home / Self Care | Payer: Medicare Other | Source: Ambulatory Visit | Attending: Cardiology | Admitting: Cardiology

## 2023-04-19 VITALS — BP 109/92 | HR 98 | Resp 18

## 2023-04-19 DIAGNOSIS — I480 Paroxysmal atrial fibrillation: Secondary | ICD-10-CM | POA: Diagnosis not present

## 2023-04-19 NOTE — Patient Instructions (Signed)
 It was great to see you today! No medication changes are needed at this time.  Your physician has recommended that you have a Cardioversion (DCCV). Electrical Cardioversion uses a jolt of electricity to your heart either through paddles or wired patches attached to your chest. This is a controlled, usually prescheduled, procedure. Defibrillation is done under light anesthesia in the hospital, and you usually go home the day of the procedure. This is done to get your heart back into a normal rhythm. You are not awake for the procedure. Please see the instruction sheet given to you today.   Dear Cassandra Ho  You are scheduled for a Cardioversion on 04/29/2023 with Dr. Shirlee Latch.  Please arrive at the [facility] (Main Entrance A) at [facility]: [address] [place], [place] [address] at 0845 am. DIET: Nothing to eat or drink after midnight except a sip of water with medications (see medication instructions below)  Medication Instructions: Hold Jardiance three days prior to procedure (last dose 04/26/23) Hold lasix (furosemide) the day of your procedure  Continue your anticoagulant: Eliquis You will need to continue your anticoagulant after your procedure until you  are told by your  Provider that it is safe to stop   Labs:pre procedure labs done 04/15/23  You must have a responsible person to drive you home and stay in the waiting area during your procedure. Failure to do so could result in cancellation.  Bring your insurance cards.  *Special Note: Every effort is made to have your procedure done on time. Occasionally there are emergencies that occur at the hospital that may cause delays. Please be patient if a delay does occur.

## 2023-04-19 NOTE — Progress Notes (Signed)
 Dear Milas Hock  You are scheduled for a Cardioversion on 04/29/2023 with Dr. Shirlee Latch.  Please arrive at the [facility] (Main Entrance A) at [facility]: [address] [place], [place] [address] at 0845 am. DIET: Nothing to eat or drink after midnight except a sip of water with medications (see medication instructions below)  Medication Instructions: Hold Jardiance three days prior to procedure (last dose 04/26/23) Hold lasix (furosemide) the day of your procedure  Continue your anticoagulant: Eliquis You will need to continue your anticoagulant after your procedure until you  are told by your  Provider that it is safe to stop   Labs:pre procedure labs done 04/15/23  You must have a responsible person to drive you home and stay in the waiting area during your procedure. Failure to do so could result in cancellation.  Bring your insurance cards.  *Special Note: Every effort is made to have your procedure done on time. Occasionally there are emergencies that occur at the hospital that may cause delays. Please be patient if a delay does occur.

## 2023-04-19 NOTE — Progress Notes (Signed)
 Pt presents to office for repeat EKG per Dr Shirlee Latch Per 04/15/23 OV I will arrange for ECG later this week. If she still has not come out of AF, she will need DCCV.   EKG reviewed by Dr Sherie Don DCCV Procedure scheduled 3/10 @ 945

## 2023-04-25 ENCOUNTER — Ambulatory Visit (HOSPITAL_COMMUNITY): Payer: Medicare Other | Admitting: Internal Medicine

## 2023-04-26 NOTE — Progress Notes (Signed)
 Called patient with pre-procedure instructions for Monday April 29, 2023   Patient informed of:   Time to arrive for procedure. 0845 Remain NPO past midnight.  Must have a ride home and a responsible adult to remain with them for 24 hours post procedure.  Confirmed blood thinner. Eliquis Confirmed no breaks in taking blood thinner for 3+ weeks prior to procedure. Confirmed patient stopped all GLP-1s and GLP-2s for at least one week before procedure. Stopped taking Jardiance 04/26/23

## 2023-04-29 ENCOUNTER — Ambulatory Visit (HOSPITAL_COMMUNITY)
Admission: RE | Admit: 2023-04-29 | Discharge: 2023-04-29 | Disposition: A | Discharge status: Home / Self Care | Payer: Medicare Other | Attending: Cardiology | Admitting: Cardiology

## 2023-04-29 ENCOUNTER — Other Ambulatory Visit (HOSPITAL_COMMUNITY): Payer: Medicare Other

## 2023-04-29 ENCOUNTER — Encounter (HOSPITAL_COMMUNITY): Payer: Self-pay | Admitting: Certified Registered Nurse Anesthetist

## 2023-04-29 DIAGNOSIS — I480 Paroxysmal atrial fibrillation: Secondary | ICD-10-CM

## 2023-04-29 SURGERY — CARDIOVERSION (CATH LAB)
Anesthesia: General

## 2023-04-30 ENCOUNTER — Telehealth: Payer: Self-pay

## 2023-04-30 ENCOUNTER — Encounter (HOSPITAL_COMMUNITY): Payer: Self-pay

## 2023-04-30 NOTE — Telephone Encounter (Signed)
**Note De-Identified Gizel Riedlinger Obfuscation** Ordering provider: Perlie Gold, PA-c Associated diagnoses: A-Fib-I48.91 and CHF- I50.32  WatchPAT PA obtained on 04/30/2023 by Bronson Bressman, Lorelle Formosa, LPN. Authorization: PA is not required for CPT Code 04540 ([place]-HST) per the [facility] provider portal.  Patient notified of PIN (1234) on 04/30/2023 Ceniya Fowers Notification Method: phone.  Phone note routed to covering staff for follow-up.

## 2023-05-02 ENCOUNTER — Ambulatory Visit (HOSPITAL_COMMUNITY): Payer: Medicare Other | Admitting: Internal Medicine

## 2023-05-02 ENCOUNTER — Encounter (HOSPITAL_COMMUNITY): Payer: Self-pay

## 2023-05-06 ENCOUNTER — Other Ambulatory Visit (HOSPITAL_COMMUNITY): Payer: Medicare Other

## 2023-05-07 NOTE — Telephone Encounter (Signed)
 Hey there, I think you cover for Clayburn Pert so routing this to you, please check for completion on 3/25.

## 2023-05-09 ENCOUNTER — Ambulatory Visit (HOSPITAL_COMMUNITY)
Admission: RE | Admit: 2023-05-09 | Discharge: 2023-05-09 | Disposition: A | Discharge status: Home / Self Care | Payer: Medicare Other | Source: Ambulatory Visit | Attending: Cardiology | Admitting: Cardiology

## 2023-05-09 VITALS — BP 110/70 | HR 58 | Wt 282.0 lb

## 2023-05-09 DIAGNOSIS — I5032 Chronic diastolic (congestive) heart failure: Secondary | ICD-10-CM

## 2023-05-09 DIAGNOSIS — Z87891 Personal history of nicotine dependence: Secondary | ICD-10-CM | POA: Diagnosis not present

## 2023-05-09 DIAGNOSIS — Z79899 Other long term (current) drug therapy: Secondary | ICD-10-CM | POA: Insufficient documentation

## 2023-05-09 DIAGNOSIS — I252 Old myocardial infarction: Secondary | ICD-10-CM | POA: Insufficient documentation

## 2023-05-09 DIAGNOSIS — G4733 Obstructive sleep apnea (adult) (pediatric): Secondary | ICD-10-CM | POA: Diagnosis not present

## 2023-05-09 DIAGNOSIS — Z79890 Hormone replacement therapy (postmenopausal): Secondary | ICD-10-CM | POA: Diagnosis not present

## 2023-05-09 DIAGNOSIS — I5022 Chronic systolic (congestive) heart failure: Secondary | ICD-10-CM | POA: Insufficient documentation

## 2023-05-09 DIAGNOSIS — I480 Paroxysmal atrial fibrillation: Secondary | ICD-10-CM | POA: Diagnosis not present

## 2023-05-09 DIAGNOSIS — M170 Bilateral primary osteoarthritis of knee: Secondary | ICD-10-CM | POA: Insufficient documentation

## 2023-05-09 DIAGNOSIS — E039 Hypothyroidism, unspecified: Secondary | ICD-10-CM | POA: Diagnosis not present

## 2023-05-09 DIAGNOSIS — Z7984 Long term (current) use of oral hypoglycemic drugs: Secondary | ICD-10-CM | POA: Diagnosis not present

## 2023-05-09 DIAGNOSIS — Z7901 Long term (current) use of anticoagulants: Secondary | ICD-10-CM | POA: Insufficient documentation

## 2023-05-09 DIAGNOSIS — I110 Hypertensive heart disease with heart failure: Secondary | ICD-10-CM | POA: Diagnosis not present

## 2023-05-09 LAB — COMPREHENSIVE METABOLIC PANEL
ALT: 19 U/L (ref 0–44)
AST: 21 U/L (ref 15–41)
Albumin: 3.7 g/dL (ref 3.5–5.0)
Alkaline Phosphatase: 56 U/L (ref 38–126)
Anion gap: 8 (ref 5–15)
BUN: 15 mg/dL (ref 8–23)
CO2: 28 mmol/L (ref 22–32)
Calcium: 8.8 mg/dL — ABNORMAL LOW (ref 8.9–10.3)
Chloride: 102 mmol/L (ref 98–111)
Creatinine, Ser: 0.86 mg/dL (ref 0.44–1.00)
GFR, Estimated: >60 mL/min (ref >60)
Glucose, Bld: 108 mg/dL — ABNORMAL HIGH (ref 70–99)
Potassium: 4.1 mmol/L (ref 3.5–5.1)
Sodium: 138 mmol/L (ref 135–145)
Total Bilirubin: 0.5 mg/dL (ref 0.0–1.2)
Total Protein: 6.4 g/dL — ABNORMAL LOW (ref 6.5–8.1)

## 2023-05-09 LAB — TSH: TSH: 4.977 u[IU]/mL — ABNORMAL HIGH (ref 0.350–4.500)

## 2023-05-09 LAB — BRAIN NATRIURETIC PEPTIDE: B Natriuretic Peptide: 38.5 pg/mL (ref 0.0–100.0)

## 2023-05-09 MED ORDER — AMIODARONE HCL 200 MG PO TABS: 200.0000 mg | ORAL_TABLET | Freq: Every day | ORAL | Status: DC

## 2023-05-09 MED ORDER — ENTRESTO 49-51 MG PO TABS: 1.0000 | ORAL_TABLET | Freq: Two times a day (BID) | ORAL | 11 refills | Status: DC

## 2023-05-09 NOTE — Progress Notes (Signed)
 Primary Care: Collene Mares, [place] Cardiology: Dr. Shirlee Latch  Chief Complaint: CHF, AF/RVR  HPI: 78 y.o. referred for evaluation of CHF from [facility] clinic.   Patient has a history of HTN, HLD, atrial fibrillation, OSA, hypothyroidism, pituitary tumor, and chronic HFrEF. Last cigarette 50 years ago. .   Admitted 03/08/23 with atrial fibrillation/RVR, new diagnosis.  Underwent cardioversion. Echo showed reduced LVEF 30-35%, normal RV, mild MR.  Placed on 25 mg Toprol XL daily. Started on GDMT.   She was seen in the office 03/22/23 and was noted to be back in AF/RVR. She was sent to the ER and cardioverted in the ER, then sent home. Amiodarone was started.   Patient returns for followup of CHF and AF. She has bilateral severe knee OA that limits her ambulation.  She uses a wheelchair most of the time outside the house, can walk with 2 canes in her house.  Weight is down 3 lbs.  No dyspnea walking around her house with her canes. No orthopnea/PND.  No lightheadedness.  No chest pain. Patient is in NSR today, no palpitations.   ECG (personally reviewed): Sinus brady 59 bpm, poor RWP  Labs (1/25): BNP 179, K 4.3, creatinine 0.88, LFTs normal, TSH normal Labs (2/25): hgb 15.2, BNP 242, TSH normal, K 4.3, creatinine 0.79  PMH: 1. Pituitary tumor: Remote.  2. 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  3. OSA 4. HTN 5. Atrial fibrillation: Paroxysmal.  - TEE-guided DCCV 03/14/23.  - DCCV 03/22/23 6. Chronic systolic CHF: Echo (1/25) with LVEF 30-35%, RV normal, mild MR 7. Hyperlipidemia 8. Obesity  Current Outpatient Medications  Medication Sig Dispense Refill   acetaminophen (TYLENOL) 325 MG tablet Take 650 mg by mouth every 6 (six) hours as needed for moderate pain (pain score 4-6).     apixaban (ELIQUIS) 5 MG TABS tablet Take 1 tablet (5 mg total) by mouth 2 (two) times daily. 60 tablet 2   conjugated estrogens (PREMARIN) vaginal cream Place  1 Applicatorful vaginally 2 (two) times a week. Place 0.5g nightly for two weeks then twice a week after 30 g 2   empagliflozin (JARDIANCE) 10 MG TABS tablet Take 1 tablet (10 mg total) by mouth daily. 30 tablet 2   famotidine (PEPCID) 20 MG tablet Take 20 mg by mouth daily as needed for heartburn or indigestion.     furosemide (LASIX) 40 MG tablet Take 1 tablet (40 mg total) by mouth 2 (two) times daily. 90 tablet 3   levothyroxine (SYNTHROID) 125 MCG tablet Take 1 tablet (125 mcg total) by mouth daily before breakfast. 30 tablet 0   loratadine (CLARITIN) 10 MG tablet Take 10 mg by mouth daily as needed for allergies.     metoprolol succinate (TOPROL-XL) 25 MG 24 hr tablet Take 1 tablet (25 mg total) by mouth 2 (two) times daily. 180 tablet 3   psyllium (HYDROCIL/METAMUCIL) 95 % PACK Take 1 packet by mouth daily.     rosuvastatin (CRESTOR) 20 MG tablet Take 1 tablet (20 mg total) by mouth daily. Please make overdue appt with Dr. Mayford Knife before anymore refills. 2nd attempt 15 tablet 0   sacubitril-valsartan (ENTRESTO) 49-51 MG Take 1 tablet by mouth 2 (two) times daily. 60 tablet 11   spironolactone (ALDACTONE) 25 MG tablet Take 1 tablet (25 mg total) by mouth daily. 90 tablet 3   traZODone (DESYREL) 50 MG tablet Take 50 mg by mouth at bedtime as needed for sleep.  Vibegron (GEMTESA) 75 MG TABS Take 1 tablet (75 mg total) by mouth daily. 28 tablet 0   amiodarone (PACERONE) 200 MG tablet Take 1 tablet (200 mg total) by mouth daily.     No current facility-administered medications for this encounter.    No Known Allergies    Social History   Socioeconomic History   Marital status: Married    Spouse name: Jake Shark   Number of children: 3   Years of education: Not on file   Highest education level: Bachelor's degree (e.g., BA, AB, BS)  Occupational History   Occupation: Retired  Tobacco Use   Smoking status: Former    Types: Cigarettes   Smokeless tobacco: Never   Tobacco comments:     Quit '78  Vaping Use   Vaping status: Never Used  Substance and Sexual Activity   Alcohol use: Yes    Comment: ocass   Drug use: No   Sexual activity: Not Currently    Partners: Male    Birth control/protection: Abstinence  Other Topics Concern   Not on file  Social History Narrative   ** Merged History Encounter **       Social Drivers of Health   Financial Resource Strain: Low Risk  (03/12/2023)   Overall Financial Resource Strain (CARDIA)    Difficulty of Paying Living Expenses: Not very hard  Food Insecurity: No Food Insecurity (03/10/2023)   Hunger Vital Sign    Worried About Running Out of Food in the Last Year: Never true    Ran Out of Food in the Last Year: Never true  Transportation Needs: No Transportation Needs (03/10/2023)   PRAPARE - Administrator, Civil Service (Medical): No    Lack of Transportation (Non-Medical): No  Physical Activity: Not on file  Stress: Not on file  Social Connections: Unknown (03/10/2023)   Social Connection and Isolation Panel [NHANES]    Frequency of Communication with Friends and Family: More than three times a week    Frequency of Social Gatherings with Friends and Family: More than three times a week    Attends Religious Services: Patient declined    Active Member of Clubs or Organizations: Patient declined    Attends Banker Meetings: Patient declined    Marital Status: Patient declined  Intimate Partner Violence: Not At Risk (03/10/2023)   Humiliation, Afraid, Rape, and Kick questionnaire    Fear of Current or Ex-Partner: No    Emotionally Abused: No    Physically Abused: No    Sexually Abused: No      Family History  Problem Relation Age of Onset   Thyroid cancer Mother    Lung cancer Mother    Melanoma Mother    Diabetes Mother    Thyroid disease Mother    Alcohol abuse Mother    Bladder Cancer Mother    Alcoholism Father    Cirrhosis Father    High blood pressure Father    High Cholesterol  Father    Fibromyalgia Sister    Breast cancer Sister        30s   Breast cancer Maternal Grandmother        10s   Breast cancer Paternal Grandmother        63s   Breast cancer Daughter 32   Breast cancer Maternal Aunt        39s    Vitals:   05/09/23 1400  BP: 110/70  Pulse: (!) 58  SpO2: 97%  Weight:  127.9 kg (282 lb)   Wt Readings from Last 3 Encounters:  05/09/23 127.9 kg (282 lb)  04/29/23 129.3 kg (285 lb)  04/11/23 129.5 kg (285 lb 6.4 oz)   PHYSICAL EXAM: General: NAD Neck: No JVD, no thyromegaly or thyroid nodule.  Lungs: Clear to auscultation bilaterally with normal respiratory effort. CV: Nondisplaced PMI.  Heart regular S1/S2, no S3/S4, no murmur.  No peripheral edema.  No carotid bruit.  Normal pedal pulses.  Abdomen: Soft, nontender, no hepatosplenomegaly, no distention.  Skin: Intact without lesions or rashes.  Neurologic: Alert and oriented x 3.  Psych: Normal affect. Extremities: No clubbing or cyanosis.  HEENT: Normal.   ASSESSMENT & PLAN: 1. Chronic systolic CHF: Echo in 1/25 showed EF 30-35%, normal RV, mild MR. 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  No FH of cardiomyopathy, no substance abuse.  No chest pain or CAD history. She is maintaining NSR on amiodarone. NYHA class II (confounded by knee OA), not volume overloaded on exam.  - Continue Lasix 40 mg bid.  BMET/BNP today.  - Continue spironolactone 25 mg daily.  - Continue Toprol XL 25 mg bid - Increase Entresto to 49/51 bid. BMET in 10 days.  - Continue Jardiance 10 mg daily.  - Awaiting cardiac MRI to reassess EF and to assess for infiltrative disease/prior MI.  If EF does not improve to normal range while in NSR, she will need ischemic evaluation with RHC/LHC.   2.  Atrial fibrillation: Paroxysmal.  Diagnosed in 1/25. DCCV to NSR 03/14/23 and again 03/22/23. She is now on amiodarone.  She is maintaining NSR now.  - Decrease amiodarone to 200 mg daily. Check LFTs, TSH.  She will need a  regular eye exam.  - Continue Toprol XL 25 mg bid for rate control.  - Continue Eliquis 5 mg bid.  - Refer to EP for consideration of AF ablation.  - Has home sleep study, needs to use.  3.Hypothyroid: Continue levothyroxine.  4. HTN: BP not elevated.  5.OSA: Previously diagnosed with OSA. Pending sleep study.    I spent 31 minutes reviewing data, interviewing patient, and organizing the orders/followup.   Follow up in 6 wks with APP.   Marca Ancona  05/09/2023

## 2023-05-09 NOTE — Patient Instructions (Signed)
 DECREASE Amiodarone to 200 mg daily.  INCREASE Entresto to 49/51 mg Twice daily  Labs done today, your results will be available in MyChart, we will contact you for abnormal readings.  Repeat blood work in 10 days.  Your physician recommends that you schedule a follow-up appointment in: 6 weeks.  If you have any questions or concerns before your next appointment please send [place] a message through [place] or call our office at [phone].    TO LEAVE A MESSAGE FOR THE NURSE SELECT OPTION 2, PLEASE LEAVE A MESSAGE INCLUDING: YOUR NAME DATE OF BIRTH CALL BACK NUMBER REASON FOR CALL**this is important as we prioritize the call backs  YOU WILL RECEIVE A CALL BACK THE SAME DAY AS LONG AS YOU CALL BEFORE 4:00 PM  At the [facility], you and your health needs are our priority. As part of our continuing mission to provide you with exceptional heart care, we have created designated Provider Care Teams. These Care Teams include your primary Cardiologist (physician) and Advanced Practice Providers (APPs- Physician Assistants and Nurse Practitioners) who all work together to provide you with the care you need, when you need it.   You may see any of the following providers on your designated Care Team at your next follow up: Dr Arvilla Meres Dr Marca Ancona Dr. Dorthula Nettles Dr. Clearnce Hasten Amy Filbert Schilder, NP Robbie Lis, [place] [facility], [place] Brynda Peon, NP [place] Lee, NP Clarisa Kindred, NP Karle Plumber, PharmD Enos Fling, PharmD   Please be sure to bring in all your medications bottles to every appointment.    Thank you for choosing [facility]

## 2023-05-13 ENCOUNTER — Encounter: Payer: Self-pay | Admitting: Obstetrics

## 2023-05-14 NOTE — Telephone Encounter (Addendum)
 Left the pt a message to call the office back to inquire if she still wants to proceed with WatchPat completion or not, at the request of our sleep team (reference below).   If pt has not returned a call back by the end of this business day, will forward this call to our sleep team for further management.      Via, Lorelle Formosa, LPN  [facility] [address] Triage2 weeks ago    I apologize but I do not know who covers Perlie Gold, PA-c so I am forwarding this message to triage so you guys can forward it to the correct person or to follow up with this testing if there is no nurse/covering for Perlie Gold, PA-c. Just FYI: Instructions for covering staff: 1. Please contact patient in 2 weeks if WatchPAT study results are not available yet. Remind patient to complete test. 2. If patient declines to proceed with test, please confirm that box is unopened and remind patient to return it to the office within 30 days. Route phone note to [facility] SLEEP STUDIES pool for tracking. 3. If box has been opened, please route phone note to [facility] SLEEP STUDIES pool to have device de-initialized and processed for billing. Thanks, [employer]

## 2023-05-15 ENCOUNTER — Telehealth (HOSPITAL_COMMUNITY): Payer: Self-pay

## 2023-05-15 ENCOUNTER — Other Ambulatory Visit: Payer: Self-pay | Admitting: Cardiology

## 2023-05-15 DIAGNOSIS — I5032 Chronic diastolic (congestive) heart failure: Secondary | ICD-10-CM

## 2023-05-15 MED ORDER — AMIODARONE HCL 200 MG PO TABS: 200.0000 mg | ORAL_TABLET | Freq: Every day | ORAL | 3 refills | Status: DC

## 2023-05-15 NOTE — Telephone Encounter (Signed)
 Patient's Amiodarone medication has been sent to pt's pharmacy.

## 2023-05-20 ENCOUNTER — Ambulatory Visit (HOSPITAL_COMMUNITY)
Admission: RE | Admit: 2023-05-20 | Discharge: 2023-05-20 | Disposition: A | Discharge status: Home / Self Care | Source: Ambulatory Visit | Attending: Internal Medicine | Admitting: Internal Medicine

## 2023-05-20 DIAGNOSIS — I5032 Chronic diastolic (congestive) heart failure: Secondary | ICD-10-CM | POA: Insufficient documentation

## 2023-05-20 LAB — BASIC METABOLIC PANEL WITH GFR
Anion gap: 9 (ref 5–15)
BUN: 24 mg/dL — ABNORMAL HIGH (ref 8–23)
CO2: 25 mmol/L (ref 22–32)
Calcium: 9.5 mg/dL (ref 8.9–10.3)
Chloride: 106 mmol/L (ref 98–111)
Creatinine, Ser: 0.86 mg/dL (ref 0.44–1.00)
GFR, Estimated: >60 mL/min (ref >60)
Glucose, Bld: 82 mg/dL (ref 70–99)
Potassium: 4.3 mmol/L (ref 3.5–5.1)
Sodium: 140 mmol/L (ref 135–145)

## 2023-05-23 ENCOUNTER — Encounter (HOSPITAL_COMMUNITY): Payer: Self-pay

## 2023-05-23 DIAGNOSIS — E890 Postprocedural hypothyroidism: Secondary | ICD-10-CM | POA: Diagnosis not present

## 2023-05-23 DIAGNOSIS — M170 Bilateral primary osteoarthritis of knee: Secondary | ICD-10-CM | POA: Diagnosis not present

## 2023-05-23 DIAGNOSIS — Z23 Encounter for immunization: Secondary | ICD-10-CM | POA: Diagnosis not present

## 2023-05-23 DIAGNOSIS — Z0000 Encounter for general adult medical examination without abnormal findings: Secondary | ICD-10-CM | POA: Diagnosis not present

## 2023-05-23 DIAGNOSIS — I4891 Unspecified atrial fibrillation: Secondary | ICD-10-CM | POA: Diagnosis not present

## 2023-05-23 DIAGNOSIS — E7800 Pure hypercholesterolemia, unspecified: Secondary | ICD-10-CM | POA: Diagnosis not present

## 2023-05-23 DIAGNOSIS — F5101 Primary insomnia: Secondary | ICD-10-CM | POA: Diagnosis not present

## 2023-05-23 DIAGNOSIS — I10 Essential (primary) hypertension: Secondary | ICD-10-CM | POA: Diagnosis not present

## 2023-05-23 DIAGNOSIS — I5022 Chronic systolic (congestive) heart failure: Secondary | ICD-10-CM | POA: Diagnosis not present

## 2023-05-27 ENCOUNTER — Other Ambulatory Visit (HOSPITAL_COMMUNITY): Payer: Self-pay | Admitting: Cardiology

## 2023-05-27 ENCOUNTER — Ambulatory Visit (HOSPITAL_COMMUNITY)
Admission: RE | Admit: 2023-05-27 | Discharge: 2023-05-27 | Disposition: A | Discharge status: Home / Self Care | Source: Ambulatory Visit | Attending: Cardiology | Admitting: Cardiology

## 2023-05-27 DIAGNOSIS — I5032 Chronic diastolic (congestive) heart failure: Secondary | ICD-10-CM | POA: Insufficient documentation

## 2023-05-27 MED ORDER — GADOBUTROL 1 MMOL/ML IV SOLN
10.0000 mL | Freq: Once | INTRAVENOUS | Status: AC | PRN
  Administered 2023-05-27: 10 mL via INTRAVENOUS

## 2023-05-27 MED ORDER — METOPROLOL SUCCINATE ER 25 MG PO TB24: 25.0000 mg | ORAL_TABLET | Freq: Two times a day (BID) | ORAL | 3 refills | Status: AC

## 2023-05-29 ENCOUNTER — Telehealth (HOSPITAL_COMMUNITY): Payer: Self-pay | Admitting: *Deleted

## 2023-05-29 NOTE — Telephone Encounter (Signed)
 Called patient per Dr. Shirlee Latch with following cardiac MRI results:  "LV EF has improved, up to 54% (borderline decreased).  There is a small area of scarring towards the apex, probably a noncoronary pattern (?myocarditis)."  Pt verbalized understanding of same. No further questions at this time. Confirmed her f/u appointment in May with [place].

## 2023-05-31 ENCOUNTER — Encounter: Payer: Self-pay | Admitting: Cardiovascular Disease

## 2023-05-31 ENCOUNTER — Ambulatory Visit: Attending: Cardiology | Admitting: Cardiovascular Disease

## 2023-05-31 VITALS — BP 118/70 | HR 62 | Ht 66.0 in | Wt 282.0 lb

## 2023-05-31 DIAGNOSIS — I5022 Chronic systolic (congestive) heart failure: Secondary | ICD-10-CM | POA: Diagnosis not present

## 2023-05-31 DIAGNOSIS — I480 Paroxysmal atrial fibrillation: Secondary | ICD-10-CM

## 2023-05-31 NOTE — Progress Notes (Signed)
 Electrophysiology Office Note:    Date:  05/31/2023   ID:  Cassandra Ho, DOB 12/14/1945, MRN [id number] PCP:  Collene Mares, PA   [facility] Providers Cardiologist:  None     Referring MD: Laurey Morale, MD   History of Present Illness:    Cassandra Ho is a 78 y.o. female with a medical history significant for atrial fibrillation, CHF with reduced ejection fraction, obstructive sleep apnea, history of hyperthyroidism with ablation of toxic nodule referred for management of atrial fibrillation.      Discussed the use of AI scribe software for clinical note transcription with the patient, who gave verbal consent to proceed.  History of Present Illness Cassandra Ho is a 78 year old female with atrial fibrillation who presents for evaluation and management of her condition. She was referred by Dr. Jearld Pies to discuss management options for atrial fibrillation.  In January, she experienced a severe upper respiratory infection lasting three weeks, during which she felt too ill to seek medical attention. Eventually, she went to urgent care due to difficulty breathing and was found to be in atrial fibrillation, leading to a seven-day hospital stay. Amiodarone was started.  She is currently on amiodarone for atrial fibrillation. She also takes metoprolol and furosemide for heart-related issues. She has a history of decreased heart function, which has shown improvement based on a recent cardiac MRI.  She has a history of thyroid issues, having had her thyroid radiated 15 years ago.   She mentions difficulty with mobility due to knee problems and is seeing a pain doctor. Her BMI is 45, which poses additional risks for certain procedures.         Today, she reports she is doing well -- she has never had palpitations and was unaware of her AF  EKGs/Labs/Other Studies Reviewed Today:     Echocardiogram:  TTE March 10, 2023 Ejection fraction 30%.   Otherwise, normal structure and function     Advanced imaging:  Cardiac MRI 05/27/2023 EF 54%.  Small area of mid wall LGE at the apical septum.  Possibly prior myocarditis  EKG:         Physical Exam:    VS:  BP 118/70 (BP Location: Left Arm, Patient Position: Sitting, Cuff Size: Large)   Pulse 62   Ht 5\' 6"  (1.676 m)   Wt 282 lb (127.9 kg)   SpO2 95%   BMI 45.52 kg/m     Wt Readings from Last 3 Encounters:  05/31/23 282 lb (127.9 kg)  05/09/23 282 lb (127.9 kg)  04/29/23 285 lb (129.3 kg)     GEN:  Well nourished, well developed in no acute distress; morbidly obese CARDIAC: RRR, no murmurs, rubs, gallops RESPIRATORY:  Normal work of breathing MUSCULOSKELETAL: trace edema    ASSESSMENT & PLAN:     Persistent atrial fibrillation Occurred in the setting of acute illness January 2025 No symptoms other than CHF I recommended rhythm control, particularly due to occurrence of CHF with RVR She is not a candidate for ablation currently; her BMI is 45.52 today In the interim, prefer to have her on a safer medication for long-term use and amiodarone We will plan for Tikosyn load after amiodarone washes out.;  Pharmacy will review medications. I think aggressive monitoring is warranted due to her lack of symptoms other than heart failure, and I recommended placement of an implantable loop recorder.  I described the procedure, the risks of minor infection and minor  bleeding, and the monitoring process.  Morbid obesity Precludes ablation at this time We discussed the importance of weight loss for maintenance of sinus rhythm and general health Exercise is limited by osteoarthritis  Heart failure with recovered ejection fraction Continue Entresto 49-51, spironolactone 25, Jardiance 10 mg  Secondary hypercoagulable state Continue Eliquis 5 mg twice daily   Signed, Maurice Small, MD  05/31/2023 2:33 PM    [facility]

## 2023-05-31 NOTE — Patient Instructions (Signed)
 Medication Instructions:  STOP Amiodarone  *If you need a refill on your cardiac medications before your next appointment, please call your pharmacy*  Testing/Procedures: Implantable Loop Recorder - someone will contact you to schedule this implant Your physician has recommended that you have a pacemaker inserted. A pacemaker is a small device that is placed under the skin of your chest or abdomen to help control abnormal heart rhythms. This device uses electrical pulses to prompt the heart to beat at a normal rate. Pacemakers are used to treat heart rhythms that are too slow. Wires (leads) are attached to the pacemaker that goes into the chambers of your heart. This is done in the hospital and usually requires an overnight stay. Please see the instruction sheet given to you today for more information.  Follow-Up: At [facility], you and your health needs are our priority.  As part of our continuing mission to provide you with exceptional heart care, our providers are all part of one team.  This team includes your primary Cardiologist (physician) and Advanced Practice Providers or APPs (Physician Assistants and Nurse Practitioners) who all work together to provide you with the care you need, when you need it.  Your next appointment:   3 month(s)  Provider:   York Pellant, MD        1st Floor: - Lobby - Registration  - Pharmacy  - Lab - Cafe  2nd Floor: - PV Lab - Diagnostic Testing (echo, CT, nuclear med)  3rd Floor: - Vacant  4th Floor: - TCTS (cardiothoracic surgery) - AFib Clinic - Structural Heart Clinic - Vascular Surgery  - Vascular Ultrasound  5th Floor: - HeartCare Cardiology (general and EP) - Clinical Pharmacy for coumadin, hypertension, lipid, weight-loss medications, and med management appointments    Valet parking services will be available as well.

## 2023-06-03 ENCOUNTER — Telehealth: Payer: Self-pay | Admitting: *Deleted

## 2023-06-03 DIAGNOSIS — M170 Bilateral primary osteoarthritis of knee: Secondary | ICD-10-CM | POA: Diagnosis not present

## 2023-06-03 NOTE — Telephone Encounter (Addendum)
 Patient with diagnosis of afib on Eliquis for anticoagulation.    Procedure: ROOT CANAL TREATMENT & CROWN ON BOTH MOLAR (TOOTH # 19), BICUSPID (TOOTH # 21), TERMINAL MOLAR (TOOTH #18),  Date of procedure: TBD ASAP   CHA2DS2-VASc Score = 5   This indicates a 7.2% annual risk of stroke. The patient's score is based upon: CHF History: 1 HTN History: 1 Diabetes History: 0 Stroke History: 0 Vascular Disease History: 0 Age Score: 2 Gender Score: 1      CrCl 76 ml/min Platelet count 207  Patient does NOT require pre-op antibiotics for dental procedure.  Per office protocol, patient can hold Eliquis for 1 day prior to procedure.     **This guidance is not considered finalized until pre-operative APP has relayed final recommendations.**

## 2023-06-03 NOTE — Telephone Encounter (Signed)
   Pre-operative Risk Assessment    Patient Name: KASIE LECCESE  DOB: Aug 16, 1945 MRN: [id number]   Date of last office visit: 05/31/23 DR. MEALOR Date of next office visit: 07/10/23 DR. MEALOR-IMPLANT CONSULT  Request for Surgical Clearance    Procedure:  ROOT CANAL TREATMENT & CROWN ON BOTH MOLAR (TOOTH # 19), BICUSPID (TOOTH # 21), TERMINAL MOLAR (TOOTH #18), IS AT RISK FOR ROOT CANAL THERAPY IF NOT RESTORED QUICKLY. THESE TEETH ARE CARIOUS AND PUT THE PT AT RISK FOR SYSTEMIC INFECTION IF NOT TREATED  QUICKLY.    Date of Surgery:  Clearance TBD                                Surgeon:  DR. Starlene Eaton, DDS Surgeon's Group or Practice Name:  [facility]  Phone number:  [phone] Fax number:  [phone]   Type of Clearance Requested:   - Medical  - Pharmacy:  Hold Apixaban (Eliquis)     Type of Anesthesia:   PT WANTS TO KNOW IF OK TO USE NITROUS (LAUGHING GAS) AND EPI   Additional requests/questions:    Princeton Broom   06/03/2023, 10:48 AM

## 2023-06-04 NOTE — Telephone Encounter (Signed)
   Patient Name: Cassandra Ho  DOB: 02-13-1946 MRN: [id number]  Primary Cardiologist: None  Chart reviewed as part of pre-operative protocol coverage. Given past medical history and time since last visit, based on ACC/AHA guidelines, LAURITA PERON is at acceptable risk for the planned procedure without further cardiovascular testing.   Patient does NOT require pre-op antibiotics for dental procedure.   Per office protocol, patient can hold Eliquis for 1 day prior to procedure.   I will route this recommendation to the requesting party via Epic fax function and remove from pre-op pool.  Please call with questions.  Ava Boatman, NP 06/04/2023, 8:36 AM

## 2023-06-19 ENCOUNTER — Telehealth (HOSPITAL_COMMUNITY): Payer: Self-pay

## 2023-06-19 ENCOUNTER — Telehealth (HOSPITAL_COMMUNITY): Payer: Self-pay | Admitting: *Deleted

## 2023-06-19 NOTE — Progress Notes (Signed)
 Primary Care: Cassandra Clunes, PA HF Cardiology: Dr. Mitzie Ho  Chief Complaint: CHF, AF/RVR  HPI: 78 y.o. referred for evaluation of CHF from [facility].   Patient has a history of HTN, HLD, atrial fibrillation, OSA, hypothyroidism, pituitary tumor, and chronic HFrEF. Last cigarette 50 years ago. .   Admitted 03/08/23 with atrial fibrillation/RVR, new diagnosis.  Underwent cardioversion. Echo showed reduced LVEF 30-35%, normal RV, mild MR.  Placed on 25 mg Toprol  XL daily. Started on GDMT.   She was seen in the office 03/22/23 and was noted to be back in AF/RVR. She was sent to the ER and cardioverted in the ER, then sent home. Amiodarone  was started.   cMRI EF 54%, RVEF 56%, possible prior myocarditis  Today she returns for AHF follow up with her husband. Overall feeling very good. Denies palpitations, CP, dizziness, edema, or PND/Orthopnea. No SOB, able to get around her house with a cane. Appetite ok. No fever or chills. Weight at home 280-282 pounds. Taking all medications. Denies ETOH, tobacco or drug use.   ECG (personally reviewed from 4/25): NSR 62 bpm  PMH: 1. Pituitary tumor: Remote.  2. 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  3. OSA 4. HTN 5. Atrial fibrillation: Paroxysmal.  - TEE-guided DCCV 03/14/23.  - DCCV 03/22/23 6. Chronic systolic CHF: Echo (1/25) with LVEF 30-35%, RV normal, mild MR 7. Hyperlipidemia 8. Obesity  Current Outpatient Medications  Medication Sig Dispense Refill   acetaminophen  (TYLENOL ) 325 MG tablet Take 650 mg by mouth every 6 (six) hours as needed for moderate pain (pain score 4-6).     apixaban  (ELIQUIS ) 5 MG TABS tablet Take 1 tablet (5 mg total) by mouth 2 (two) times daily. 60 tablet 2   conjugated estrogens  (PREMARIN ) vaginal cream Place 1 Applicatorful vaginally 2 (two) times a week. Place 0.5g nightly for two weeks then twice a week after 30 g 2   [START ON 07/02/2023] DULoxetine (CYMBALTA)  60 MG capsule Take 1 capsule by mouth daily.     empagliflozin  (JARDIANCE ) 10 MG TABS tablet Take 1 tablet (10 mg total) by mouth daily. 30 tablet 2   famotidine  (PEPCID ) 20 MG tablet Take 20 mg by mouth daily as needed for heartburn or indigestion.     furosemide  (LASIX ) 40 MG tablet Take 1 tablet (40 mg total) by mouth 2 (two) times daily. 90 tablet 3   levothyroxine  (SYNTHROID ) 125 MCG tablet Take 1 tablet (125 mcg total) by mouth daily before breakfast. 30 tablet 0   loratadine  (CLARITIN ) 10 MG tablet Take 10 mg by mouth daily as needed for allergies.     metoprolol  succinate (TOPROL -XL) 25 MG 24 hr tablet Take 1 tablet (25 mg total) by mouth 2 (two) times daily. 180 tablet 3   psyllium (HYDROCIL/METAMUCIL) 95 % PACK Take 1 packet by mouth daily.     rosuvastatin  (CRESTOR ) 20 MG tablet Take 1 tablet (20 mg total) by mouth daily. Please make overdue appt with Dr. Micael Adas before anymore refills. 2nd attempt 15 tablet 0   sacubitril -valsartan  (ENTRESTO ) 49-51 MG Take 1 tablet by mouth 2 (two) times daily. 60 tablet 11   spironolactone  (ALDACTONE ) 25 MG tablet Take 1 tablet (25 mg total) by mouth daily. 90 tablet 3   traZODone (DESYREL) 50 MG tablet Take 50 mg by mouth at bedtime as needed for sleep.     Vibegron  (GEMTESA ) 75 MG TABS Take 1 tablet (75 mg total) by mouth daily. 28  tablet 0   No current facility-administered medications for this encounter.    No Known Allergies    Social History   Socioeconomic History   Marital status: Married    Spouse name: Cassandra Ho   Number of children: 3   Years of education: Not on file   Highest education level: Bachelor's degree (e.g., BA, AB, BS)  Occupational History   Occupation: Retired  Tobacco Use   Smoking status: Former    Types: Cigarettes   Smokeless tobacco: Never   Tobacco comments:    Quit '78  Vaping Use   Vaping status: Never Used  Substance and Sexual Activity   Alcohol use: Yes    Comment: ocass   Drug use: No   Sexual  activity: Not Currently    Partners: Male    Birth control/protection: Abstinence  Other Topics Concern   Not on file  Social History Narrative   ** Merged History Encounter **       Social Drivers of Health   Financial Resource Strain: Low Risk  (03/12/2023)   Overall Financial Resource Strain (CARDIA)    Difficulty of Paying Living Expenses: Not very hard  Food Insecurity: No Food Insecurity (03/10/2023)   Hunger Vital Sign    Worried About Running Out of Food in the Last Year: Never true    Ran Out of Food in the Last Year: Never true  Transportation Needs: No Transportation Needs (03/10/2023)   PRAPARE - Administrator, Civil Service (Medical): No    Lack of Transportation (Non-Medical): No  Physical Activity: Not on file  Stress: Not on file  Social Connections: Unknown (03/10/2023)   Social Connection and Isolation Panel [NHANES]    Frequency of Communication with Friends and Family: More than three times a week    Frequency of Social Gatherings with Friends and Family: More than three times a week    Attends Religious Services: Patient declined    Active Member of Clubs or Organizations: Patient declined    Attends Banker Meetings: Patient declined    Marital Status: Patient declined  Intimate Partner Violence: Not At Risk (03/10/2023)   Humiliation, Afraid, Rape, and Kick questionnaire    Fear of Current or Ex-Partner: No    Emotionally Abused: No    Physically Abused: No    Sexually Abused: No      Family History  Problem Relation Age of Onset   Thyroid  cancer Mother    Lung cancer Mother    Melanoma Mother    Diabetes Mother    Thyroid  disease Mother    Alcohol abuse Mother    Bladder Cancer Mother    Alcoholism Father    Cirrhosis Father    High blood pressure Father    High Cholesterol Father    Fibromyalgia Sister    Breast cancer Sister        30s   Breast cancer Maternal Grandmother        18s   Breast cancer Paternal  Grandmother        68s   Breast cancer Daughter 97   Breast cancer Maternal Aunt        15s    Vitals:   06/20/23 1502  BP: 105/61  Pulse: 67  SpO2: 95%  Weight: 128 kg (282 lb 3.2 oz)  Height: 5\' 6"  (1.676 m)    Wt Readings from Last 3 Encounters:  06/20/23 128 kg (282 lb 3.2 oz)  05/31/23 127.9 kg (282  lb)  05/09/23 127.9 kg (282 lb)   PHYSICAL EXAM: General:  elderly appearing.  No respiratory difficulty. Arrived in [facility] Neck: supple. JVD ~6 cm.  Cor: PMI nondisplaced. Regular rate & rhythm. No rubs, gallops or murmurs. Lungs: clear Extremities: no cyanosis, clubbing, rash, non-pitting LLE edema  Neuro: alert & oriented x 3. Moves all 4 extremities w/o difficulty. Affect pleasant.   ASSESSMENT & PLAN: 1. Chronic systolic, now recovered CHF: Echo in 1/25 showed EF 30-35%, normal RV, mild MR. 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  No FH of cardiomyopathy, no substance abuse.  No chest pain or CAD history. She is maintaining NSR on amiodarone . cMRI 4/25 EF 54%, RVEF 56%, possible prior myocarditis - NYHA class II (confounded by knee OA), not volume overloaded on exam.  - Continue Lasix  40 mg bid.  BMET/BNP today.  - Continue spironolactone  25 mg daily.  - Continue Toprol  XL 25 mg bid - Continue Entresto  49/51 bid.  - Continue Jardiance  10 mg daily.   2.  Atrial fibrillation: Paroxysmal.  Diagnosed in 1/25. DCCV to NSR 03/14/23 and again 03/22/23. She is now on amiodarone .  She is maintaining NSR now.  - Continue Toprol  XL 25 mg bid for rate control.  - Continue Eliquis  5 mg bid.  - Referred to EP. Deemed not a candidate for ablation with BMI. Plan for implantable loop recorder placement.  - Plan for Tikosyn after amio washout.  - Has home sleep study, needs to use. She's waiting for allergy season to be over before she does it. Has it on her bed stand and has not forgotten.   3.Hypothyroid: Continue levothyroxine .   4. HTN: BP not elevated.   5.OSA: Previously  diagnosed with OSA. Pending sleep study   She recently saw pain doctor at [facility] and requested advise on starting Celebrex. Discussed personally with Dr. Mitzie Ho and he does not advise daily use of Celebrex. Once a week would be ok but would avoid frequent use.   Follow up in 4-6 months with Dr. Mitzie Ho.   Cassandra Ho  06/20/2023

## 2023-06-19 NOTE — Telephone Encounter (Signed)
 Called to confirm/remind patient of their appointment at the [facility] on 06/20/23.   Appointment:   [x] Confirmed  [] Left mess   [] No answer/No voice mail  [] VM Full/unable to leave message  [] Phone not in service  Patient reminded to bring all medications and/or complete list.  Confirmed patient has transportation. Gave directions, instructed to utilize valet parking.

## 2023-06-19 NOTE — Telephone Encounter (Signed)
 Called to confirm/remind patient of their appointment at the [facility] on 05/24/23.    Appointment:              [] Confirmed             [x] Left mess              [] No answer/No voice mail             [] Phone not in service   Patient reminded to bring all medications and/or complete list.   Confirmed patient has transportation. Gave directions, instructed to utilize valet parking.

## 2023-06-20 ENCOUNTER — Ambulatory Visit (HOSPITAL_COMMUNITY)
Admission: RE | Admit: 2023-06-20 | Discharge: 2023-06-20 | Disposition: A | Discharge status: Home / Self Care | Source: Ambulatory Visit | Attending: Internal Medicine | Admitting: Internal Medicine

## 2023-06-20 ENCOUNTER — Encounter (HOSPITAL_COMMUNITY): Payer: Self-pay

## 2023-06-20 VITALS — BP 105/61 | HR 67 | Ht 66.0 in | Wt 282.2 lb

## 2023-06-20 DIAGNOSIS — D497 Neoplasm of unspecified behavior of endocrine glands and other parts of nervous system: Secondary | ICD-10-CM | POA: Diagnosis not present

## 2023-06-20 DIAGNOSIS — I480 Paroxysmal atrial fibrillation: Secondary | ICD-10-CM | POA: Diagnosis not present

## 2023-06-20 DIAGNOSIS — Z87891 Personal history of nicotine dependence: Secondary | ICD-10-CM | POA: Insufficient documentation

## 2023-06-20 DIAGNOSIS — I4891 Unspecified atrial fibrillation: Secondary | ICD-10-CM | POA: Diagnosis present

## 2023-06-20 DIAGNOSIS — Z79890 Hormone replacement therapy (postmenopausal): Secondary | ICD-10-CM | POA: Diagnosis not present

## 2023-06-20 DIAGNOSIS — Z79899 Other long term (current) drug therapy: Secondary | ICD-10-CM | POA: Diagnosis not present

## 2023-06-20 DIAGNOSIS — G4733 Obstructive sleep apnea (adult) (pediatric): Secondary | ICD-10-CM | POA: Diagnosis not present

## 2023-06-20 DIAGNOSIS — E039 Hypothyroidism, unspecified: Secondary | ICD-10-CM | POA: Insufficient documentation

## 2023-06-20 DIAGNOSIS — Z7901 Long term (current) use of anticoagulants: Secondary | ICD-10-CM | POA: Insufficient documentation

## 2023-06-20 DIAGNOSIS — I5032 Chronic diastolic (congestive) heart failure: Secondary | ICD-10-CM

## 2023-06-20 DIAGNOSIS — I10 Essential (primary) hypertension: Secondary | ICD-10-CM | POA: Diagnosis not present

## 2023-06-20 DIAGNOSIS — I110 Hypertensive heart disease with heart failure: Secondary | ICD-10-CM | POA: Insufficient documentation

## 2023-06-20 DIAGNOSIS — I5022 Chronic systolic (congestive) heart failure: Secondary | ICD-10-CM | POA: Diagnosis not present

## 2023-06-20 DIAGNOSIS — E785 Hyperlipidemia, unspecified: Secondary | ICD-10-CM | POA: Diagnosis not present

## 2023-06-20 LAB — BASIC METABOLIC PANEL WITH GFR
Anion gap: 9 (ref 5–15)
BUN: 28 mg/dL — ABNORMAL HIGH (ref 8–23)
CO2: 25 mmol/L (ref 22–32)
Calcium: 9.5 mg/dL (ref 8.9–10.3)
Chloride: 101 mmol/L (ref 98–111)
Creatinine, Ser: 0.91 mg/dL (ref 0.44–1.00)
GFR, Estimated: >60 mL/min (ref >60)
Glucose, Bld: 99 mg/dL (ref 70–99)
Potassium: 4.6 mmol/L (ref 3.5–5.1)
Sodium: 135 mmol/L (ref 135–145)

## 2023-06-20 LAB — BRAIN NATRIURETIC PEPTIDE: B Natriuretic Peptide: 12.3 pg/mL (ref 0.0–100.0)

## 2023-06-20 NOTE — Patient Instructions (Signed)
 Medication Changes:  No Changes In Medications at this time.   Lab Work:  Labs done today, your results will be available in MyChart, we will contact you for abnormal readings.  Follow-Up in: 4-6 months PLEASE CALL OUR OFFICE AROUND JULY  TO GET SCHEDULED FOR YOUR APPOINTMENT. PHONE NUMBER IS [phone] OPTION 2   At the [facility], you and your health needs are our priority. We have a designated team specialized in the treatment of Heart Failure. This Care Team includes your primary Heart Failure Specialized Cardiologist (physician), Advanced Practice Providers (APPs- Physician Assistants and Nurse Practitioners), and Pharmacist who all work together to provide you with the care you need, when you need it.   You may see any of the following providers on your designated Care Team at your next follow up:  Dr. Jules Oar Dr. Peder Bourdon Dr. Alwin Baars Dr. Judyth Nunnery Nieves Bars, NP Ruddy Corral, [place] [facility] [place], [place] Dennise Fitz, NP [place] Lee, NP Luster Salters, PharmD   Please be sure to bring in all your medications bottles to every appointment.   Need to Contact Us :  If you have any questions or concerns before your next appointment please send us  a message through [place] or call our office at [phone].    TO LEAVE A MESSAGE FOR THE NURSE SELECT OPTION 2, PLEASE LEAVE A MESSAGE INCLUDING: YOUR NAME DATE OF BIRTH CALL BACK NUMBER REASON FOR CALL**this is important as we prioritize the call backs  YOU WILL RECEIVE A CALL BACK THE SAME DAY AS LONG AS YOU CALL BEFORE 4:00 PM

## 2023-06-24 ENCOUNTER — Ambulatory Visit: Payer: Medicare Other | Admitting: Obstetrics

## 2023-06-24 NOTE — Telephone Encounter (Signed)
@   Dr Erminia Hazel you like repeat  echo? Patient seen at [facility] on 04/11/23 ordered repeat echo in 4 months. Has since established with AHF clinic. CMri done 05/27/23. Currently scheduled for echo 07/2023

## 2023-07-03 ENCOUNTER — Encounter (HOSPITAL_BASED_OUTPATIENT_CLINIC_OR_DEPARTMENT_OTHER): Payer: Self-pay | Admitting: Physical Therapy

## 2023-07-04 ENCOUNTER — Other Ambulatory Visit: Payer: Self-pay | Admitting: Cardiology

## 2023-07-04 ENCOUNTER — Ambulatory Visit: Admitting: Obstetrics

## 2023-07-04 ENCOUNTER — Encounter: Payer: Self-pay | Admitting: Obstetrics

## 2023-07-04 VITALS — BP 127/72 | HR 69

## 2023-07-04 DIAGNOSIS — Z4689 Encounter for fitting and adjustment of other specified devices: Secondary | ICD-10-CM | POA: Diagnosis not present

## 2023-07-04 DIAGNOSIS — N819 Female genital prolapse, unspecified: Secondary | ICD-10-CM | POA: Diagnosis not present

## 2023-07-04 DIAGNOSIS — N952 Postmenopausal atrophic vaginitis: Secondary | ICD-10-CM | POA: Diagnosis not present

## 2023-07-04 DIAGNOSIS — N3946 Mixed incontinence: Secondary | ICD-10-CM | POA: Diagnosis not present

## 2023-07-04 DIAGNOSIS — I480 Paroxysmal atrial fibrillation: Secondary | ICD-10-CM

## 2023-07-04 NOTE — Assessment & Plan Note (Addendum)
-   desires to replace pessary due to worsening leakage, self discontinued Gemtesa  due to lack of relief. - 01/07/23 POCT + leuk, culture with E. Coli resistant to Ampicillin, Augmentin , cefazolin, Zosyn, and bactrim. Rx macrobid  - prior bladder scan 42mL WNL - avoided pool exercises due to leakage, previously pending knee surgery after weight loss prior to recent A.fib diagnosis - We discussed the symptoms of overactive bladder (OAB), which include urinary urgency, urinary frequency, nocturia, with or without urge incontinence.  While we do not know the exact etiology of OAB, several treatment options exist. We discussed management including behavioral therapy (decreasing bladder irritants, urge suppression strategies, timed voids, bladder retraining), physical therapy, medication; for refractory cases posterior tibial nerve stimulation, sacral neuromodulation, and intravesical botulinum toxin injection.  For anticholinergic medications, we discussed the potential side effects of anticholinergics including dry eyes, dry mouth, constipation, cognitive impairment and urinary retention. For Beta-3 agonist medication, we discussed the potential side effect of elevated blood pressure which is more likely to occur in individuals with uncontrolled hypertension. - For treatment of stress urinary incontinence,  non-surgical options include expectant management, weight loss, physical therapy, as well as a pessary.  Surgical options include a midurethral sling, Burch urethropexy, and transurethral injection of a bulking agent. - encouraged refitting for incontinence pessary after resuming vaginal estrogen

## 2023-07-04 NOTE — Progress Notes (Signed)
 [facility] Return Visit  SUBJECTIVE  History of Present Illness: Cassandra Ho is a 78 y.o. female seen in follow-up for mixed urinary incontinence, pelvic organ porlapse, vaginal erosion with pessary use, vaginal discharge, nocturia, constipation and history of PMB. Plan at last visit was continue vaginal estrogen, gemtesa .   Underwent cardioversion on 05/05/23 for A. Fib and continues Xarelto . Pending loop implantable loop recorder placement next week.  Using Lasix  40mg  BID, reduced night time frequency when she changed her afternoon dosing to 5pm. Reports increased stress incontinence since 03/2023 when she goes up stairs or gets out of wheelchair. Desires refitting with incontinence pessary Stopped Gemtesa  for the past 3 weeks ago due to lack of relief. Continue fiber supplementation   Past Medical History: Patient  has a past medical history of Allergic rhinitis, Arthritis, BCC (basal cell carcinoma of skin), Constipation, Dyslipidemia, Edema of both lower extremities, GERD (gastroesophageal reflux disease), H/O: pituitary tumor, Headache, High cholesterol, Hypertension, Hypothyroidism, Hypothyroidism (acquired), Joint pain, Morbid obesity (HCC), OSA (obstructive sleep apnea), Osteoarthritis, Toxic solitary thyroid  nodule, and Transfusion history.   Past Surgical History: She  has a past surgical history that includes Carpal tunnel release (Bilateral); Knee arthroscopy (Right); Breast surgery; Ankle Arthrotomy (Left); Hip fracture surgery; Colonoscopy with propofol  (N/A, 06/15/2014); Breast biopsy (Bilateral); torn meniscus; Dilation and curettage of uterus (1973); Dilation and curettage of uterus (1974); Dilation and curettage of uterus (1975); D&C (1977); Dilation and curettage of uterus (1978); Dilation and curettage of uterus (1991); IUD removal (2000); TRANSESOPHAGEAL ECHOCARDIOGRAM (N/A, 03/14/2023); and CARDIOVERSION (N/A, 03/14/2023).   Medications: She has a current  medication list which includes the following prescription(s): acetaminophen , conjugated estrogens , duloxetine, empagliflozin , famotidine , furosemide , levothyroxine , loratadine , metoprolol  succinate, psyllium, rosuvastatin , entresto , spironolactone , trazodone, and apixaban .   Allergies: Patient has no known allergies.   Social History: Patient  reports that she has quit smoking. Her smoking use included cigarettes. She has never used smokeless tobacco. She reports current alcohol use. She reports that she does not use drugs.     OBJECTIVE     Physical Exam: Vitals:   07/04/23 1244  BP: 127/72  Pulse: 69   Physical Exam Constitutional:      General: She is not in acute distress.    Appearance: Normal appearance.  Genitourinary:     No lesions in the vagina.     Right Labia: No rash, tenderness, lesions, skin changes or Bartholin's cyst.    Left Labia: No tenderness, lesions, skin changes, Bartholin's cyst or rash.    No vaginal discharge, erythema, tenderness, bleeding, ulceration or granulation tissue.     Severe vaginal atrophy present. Cardiovascular:     Pulses: Normal pulses.  Pulmonary:     Effort: Pulmonary effort is normal. No respiratory distress.  Abdominal:     General: There is no distension.  Neurological:     Mental Status: She is alert.  Vitals reviewed. Exam conducted with a chaperone present.    A size 2 incontinence dish pessary was placed, removed due to discomfort. A size 1 incontinence dish pessary was placed, patient voided without difficulty. However discomfort was noted with sitting and pessary was removed. A size 0 incontinence ring was placed, expelled with valsalva. A size 2 cube pessary was placed, removed due to discomfort. A size 0 cube pessary was placed, it was comfortable, stayed in place with valsalva and was an appropriate size on examination, with one finger fitting between the pessary and the vaginal walls.  ASSESSMENT AND PLAN     Cassandra Ho is a 78 y.o. with:  1. 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 Urinary incontinence, mixed   3. Vaginal atrophy   4. Fitting and adjustment of pessary     Female genital prolapse, unspecified type Assessment & Plan: - size 2 ring with support pessary increased to size 3 in 05/2022 - desires pessary refitting. Failed size 1 (discomfort), 2 incontinence dish (discomfort), size 2 cube (discomfort). Fitting for size 0 cube pessary. Reassess in 2-3 weeks. - For treatment of pelvic organ prolapse, we discussed options for management including expectant management, conservative management, and surgical management, such as Kegels, a pessary, pelvic floor physical therapy, and specific surgical procedures. - removed pessary 01/07/23 due to ulceration at bilateral vaginal apices, resolved on exam - resume vaginal estrogen - encouraged splinting or pelvic wand use to ensure bladder and bowel emptying - on Xarelto  for A.fib and pending cardiac procedures   Urinary incontinence, mixed Assessment & Plan: - desires to replace pessary due to worsening leakage, self discontinued Gemtesa  due to lack of relief. - 01/07/23 POCT + leuk, culture with E. Coli resistant to Ampicillin, Augmentin , cefazolin, Zosyn, and bactrim. Rx macrobid  - prior bladder scan 42mL WNL - avoided pool exercises due to leakage, previously pending knee surgery after weight loss prior to recent A.fib diagnosis - We discussed the symptoms of overactive bladder (OAB), which include urinary urgency, urinary frequency, nocturia, with or without urge incontinence.  While we do not know the exact etiology of OAB, several treatment options exist. We discussed management including behavioral therapy (decreasing bladder irritants, urge suppression strategies, timed voids, bladder retraining), physical therapy, medication; for refractory cases posterior tibial nerve stimulation, sacral neuromodulation, and intravesical  botulinum toxin injection.  For anticholinergic medications, we discussed the potential side effects of anticholinergics including dry eyes, dry mouth, constipation, cognitive impairment and urinary retention. For Beta-3 agonist medication, we discussed the potential side effect of elevated blood pressure which is more likely to occur in individuals with uncontrolled hypertension. - For treatment of stress urinary incontinence,  non-surgical options include expectant management, weight loss, physical therapy, as well as a pessary.  Surgical options include a midurethral sling, Burch urethropexy, and transurethral injection of a bulking agent. - encouraged refitting for incontinence pessary after resuming vaginal estrogen   Vaginal atrophy Assessment & Plan: - likely due to vaginal ulcer and pessary use, unable to self manage - 01/07/23 Nuswab with BV s/p treatment - For symptomatic vaginal atrophy options include lubrication with a water-based lubricant, personal hygiene measures and barrier protection against wetness, and estrogen replacement in the form of vaginal cream, vaginal tablets, or a time-released vaginal ring.   - encouraged to resume low dose vaginal estrogen   Fitting and adjustment of pessary Assessment & Plan: - previously used size 2 and 3 ring with support pessary, discontinued in the past due to erosions - resume vaginal estrogen to improve comfort with pessary management and reduce discharge - failed size 1 and 2 incontinence dish, size 2 cube, and size 0 incontinence ring pessary - discussed risk of change in urinary or bowel symptoms, vaginal ulceration, discharge, bleeding, fistula formation. Explained that pt may require multiple sizes and types for fitting.  - trial of size 0 cube pessary, will try incontinence pessary if leakage worsens   Time spent: I spent 44 minutes dedicated to the care of this patient on the date of this encounter to include pre-visit review of  records,  face-to-face time with the patient discussing pessary fitting, vaginal atrophy, mixed urinary incontinence, pelvic organ prolapse, and post visit documentation.    Darlene Ehlers, MD

## 2023-07-04 NOTE — Assessment & Plan Note (Addendum)
-   likely due to vaginal ulcer and pessary use, unable to self manage - 01/07/23 Nuswab with BV s/p treatment - For symptomatic vaginal atrophy options include lubrication with a water-based lubricant, personal hygiene measures and barrier protection against wetness, and estrogen replacement in the form of vaginal cream, vaginal tablets, or a time-released vaginal ring.   - encouraged to resume low dose vaginal estrogen

## 2023-07-04 NOTE — Patient Instructions (Addendum)
 Resume vaginal estrogen 1g twice a week.   You are opting to try a size 0 cube pessary. This will keep the bulge inside and prevent it from getting worse. You can learn how to remove it yourself or we can do that for you every 3 months.   Please call if you are having pain, inability to void or have a bowel movement.

## 2023-07-04 NOTE — Assessment & Plan Note (Signed)
-   size 2 ring with support pessary increased to size 3 in 05/2022 - desires pessary refitting. Failed size 1 (discomfort), 2 incontinence dish (discomfort), size 2 cube (discomfort). Fitting for size 0 cube pessary. Reassess in 2-3 weeks. - For treatment of pelvic organ prolapse, we discussed options for management including expectant management, conservative management, and surgical management, such as Kegels, a pessary, pelvic floor physical therapy, and specific surgical procedures. - removed pessary 01/07/23 due to ulceration at bilateral vaginal apices, resolved on exam - resume vaginal estrogen - encouraged splinting or pelvic wand use to ensure bladder and bowel emptying - on Xarelto  for A.fib and pending cardiac procedures

## 2023-07-04 NOTE — Assessment & Plan Note (Addendum)
-   previously used size 2 and 3 ring with support pessary, discontinued in the past due to erosions - resume vaginal estrogen to improve comfort with pessary management and reduce discharge - failed size 1 and 2 incontinence dish, size 2 cube, and size 0 incontinence ring pessary - discussed risk of change in urinary or bowel symptoms, vaginal ulceration, discharge, bleeding, fistula formation. Explained that pt may require multiple sizes and types for fitting.  - trial of size 0 cube pessary, will try incontinence pessary if leakage worsens

## 2023-07-04 NOTE — Telephone Encounter (Signed)
 Prescription refill request for Eliquis  received. Indication: Afib  Last office visit: 06/20/23 (CHF)  Scr: 0.91 (06/20/23)  Age: 78 Weight: 128kg  Appropriate dose. Refill sent.

## 2023-07-10 ENCOUNTER — Encounter: Payer: Self-pay | Admitting: Cardiovascular Disease

## 2023-07-10 ENCOUNTER — Ambulatory Visit: Attending: Cardiovascular Disease | Admitting: Cardiovascular Disease

## 2023-07-10 VITALS — BP 118/66 | HR 64 | Ht 66.0 in | Wt 282.0 lb

## 2023-07-10 DIAGNOSIS — I4891 Unspecified atrial fibrillation: Secondary | ICD-10-CM

## 2023-07-10 DIAGNOSIS — I5032 Chronic diastolic (congestive) heart failure: Secondary | ICD-10-CM

## 2023-07-10 DIAGNOSIS — I10 Essential (primary) hypertension: Secondary | ICD-10-CM | POA: Diagnosis not present

## 2023-07-10 NOTE — Patient Instructions (Signed)
 Medication Instructions:  Your physician recommends that you continue on your current medications as directed. Please refer to the Current Medication list given to you today.  Labwork: None ordered.  Testing/Procedures: None ordered.  Follow-Up:  Your physician wants you to follow-up in: one year with Dr. York Pellant.  You will receive a reminder letter in the mail two months in advance. If you don't receive a letter, please call our office to schedule the follow-up appointment.  Implantable Loop Recorder Placement, Care After This sheet gives you information about how to care for yourself after your procedure. Your health care provider may also give you more specific instructions. If you have problems or questions, contact your health care provider.  What can I expect after the procedure? After the procedure, it is common to have: Soreness or discomfort near the incision. Some swelling or bruising near the incision.  Follow these instructions at home: Incision care  Monitor your cardiac device site for redness, swelling, and drainage. Call the device clinic at [phone] if you experience these symptoms or fever/chills.  Keep the large square bandage on your site for 24 hours and then you may remove it yourself. Keep the steri-strips underneath in place.   You may shower after 72 hours / 3 days from your procedure with the steri-strips in place. They will usually fall off on their own, or may be removed after 10 days. Pat dry.   Avoid lotions, ointments, or perfumes over your incision until it is well-healed.  Please do not submerge in water until your site is completely healed.   Your device is MRI compatible.   Remote monitoring is used to monitor your cardiac device from home. This monitoring is scheduled every month by our office. It allows [place] to keep an eye on the function of your device to ensure it is working properly.  If your wound site starts to bleed apply  pressure.    For help with the monitor please call [employer] Monitor Support Specialist directly at [phone].    If you have any questions/concerns please call the device clinic at [phone].  Activity  Return to your normal activities.  General instructions Follow instructions from your health care provider about how to manage your implantable loop recorder and transmit the information. Learn how to activate a recording if this is necessary for your type of device. You may go through a metal detection gate, and you may let someone hold a metal detector over your chest. Show your ID card if needed. Do not have an MRI unless you check with your health care provider first. Take over-the-counter and prescription medicines only as told by your health care provider. Keep all follow-up visits as told by your health care provider. This is important. Contact a health care provider if: You have redness, swelling, or pain around your incision. You have a fever. You have pain that is not relieved by your pain medicine. You have triggered your device because of fainting (syncope) or because of a heartbeat that feels like it is racing, slow, fluttering, or skipping (palpitations). Get help right away if you have: Chest pain. Difficulty breathing. Summary After the procedure, it is common to have soreness or discomfort near the incision. Change your dressing as told by your health care provider. Follow instructions from your health care provider about how to manage your implantable loop recorder and transmit the information. Keep all follow-up visits as told by your health care provider. This is important. This information  is not intended to replace advice given to you by your health care provider. Make sure you discuss any questions you have with your health care provider. Document Released: 01/17/2015 Document Revised: 03/23/2017 Document Reviewed: 03/23/2017 Elsevier Patient Education   2020 [employer].

## 2023-07-10 NOTE — Progress Notes (Signed)
 Electrophysiology Office Note:    Date:  07/10/2023   ID:  Cassandra Ho, DOB 1946/02/03, MRN [id number] PCP:  Thedora Finlay, PA    HeartCare Providers Cardiologist:  None Electrophysiologist:  Efraim Grange, MD     Referring MD: Annabell Key, [place]  E, PA   History of Present Illness:    Cassandra Ho is a 78 y.o. female with a medical history significant for atrial fibrillation, CHF with reduced ejection fraction, obstructive sleep apnea, history of hyperthyroidism with ablation of toxic nodule referred for management of atrial fibrillation.      Discussed the use of AI scribe software for clinical note transcription with the patient, who gave verbal consent to proceed.  History of Present Illness Cassandra Ho is a 78 year old female with atrial fibrillation who presents for evaluation and management of her condition. She was referred by Dr. Harlie Lieu to discuss management options for atrial fibrillation.  In January, she experienced a severe upper respiratory infection lasting three weeks, during which she felt too ill to seek medical attention. Eventually, she went to urgent care due to difficulty breathing and was found to be in atrial fibrillation, leading to a seven-day hospital stay. Amiodarone  was started.  She is currently on amiodarone  for atrial fibrillation. She also takes metoprolol  and furosemide  for heart-related issues. She has a history of decreased heart function, which has shown improvement based on a recent cardiac MRI.  She has a history of thyroid  issues, having had her thyroid  radiated 15 years ago.   She mentions difficulty with mobility due to knee problems and is seeing a pain doctor. Her BMI is 45, which poses additional risks for certain procedures.         Today, she reports she is doing well -- she has never had palpitations and was unaware of her AF. She presents today for placement of a loop recorder.    EKGs/Labs/Other Studies Reviewed Today:     Echocardiogram:  TTE March 10, 2023 Ejection fraction 30%.  Otherwise, normal structure and function     Advanced imaging:  Cardiac MRI 05/27/2023 EF 54%.  Small area of mid wall LGE at the apical septum.  Possibly prior myocarditis  EKG:         Physical Exam:    VS:  BP 118/66 (BP Location: Left Arm, Patient Position: Sitting, Cuff Size: Normal)   Pulse 64   Ht 5\' 6"  (1.676 m)   Wt 282 lb (127.9 kg)   BMI 45.52 kg/m     Wt Readings from Last 3 Encounters:  07/10/23 282 lb (127.9 kg)  06/20/23 282 lb 3.2 oz (128 kg)  05/31/23 282 lb (127.9 kg)     GEN:  Well nourished, well developed in no acute distress; morbidly obese CARDIAC: RRR, no murmurs, rubs, gallops RESPIRATORY:  Normal work of breathing MUSCULOSKELETAL: trace edema    ASSESSMENT & PLAN:     Persistent atrial fibrillation Occurred in the setting of acute illness January 2025 No symptoms other than CHF I recommended rhythm control, particularly due to occurrence of CHF with RVR She is not a candidate for ablation currently; her BMI is 45.52 today In the interim, prefer to have her on a safer medication for long-term use and amiodarone  We will plan for Tikosyn load after amiodarone  washes out.;  Pharmacy will review medications. I think aggressive monitoring is warranted due to her lack of symptoms other than heart failure, and I recommended placement of an implantable  loop recorder.  I described the procedure, the risks of minor infection and minor bleeding, and the monitoring process.  Morbid obesity Precludes ablation at this time We discussed the importance of weight loss for maintenance of sinus rhythm and general health Exercise is limited by osteoarthritis  Heart failure with recovered ejection fraction Continue Entresto  49-51, spironolactone  25, Jardiance  10 mg  Secondary hypercoagulable state Continue Eliquis  5 mg twice daily  SURGEON:   Efraim Grange, MD     PREPROCEDURE DIAGNOSIS:  Atrial fibrillation    POSTPROCEDURE DIAGNOSIS: Atrial fibrillation     PROCEDURES:   1. Implantable loop recorder implantation    INTRODUCTION:  Cassandra Ho presents with a history of atrial fibrillation The costs of loop recorder monitoring have been discussed with the patient.    DESCRIPTION OF PROCEDURE:  Informed written consent was obtained.  A timeout was performed. The patient required no sedation for the procedure today. The patients left chest was prepped and draped in the usual sterile fashion. The skin overlying the left parasternal region was infiltrated with lidocaine  for local analgesia.  A 0.5-cm incision was made over the left parasternal region over the 3rd intercostal space.  A subcutaneous ILR pocket was fashioned using a combination of sharp and blunt dissection.  A [employer] LUX  ([id number]) implantable loop recorder was then placed into the pocket  R waves were prominent.  Steri- Strips and a sterile dressing were then applied.  There were no early apparent complications.     CONCLUSIONS:   1. Successful implantation of a implantable loop recorder for a history of atrial fibrillation  2. No early apparent complications.   Efraim Grange, MD  Cardiac Electrophysiology      Signed, Efraim Grange, MD  07/10/2023 12:50 PM    [facility]

## 2023-07-18 ENCOUNTER — Ambulatory Visit (HOSPITAL_BASED_OUTPATIENT_CLINIC_OR_DEPARTMENT_OTHER): Admitting: Physical Therapy

## 2023-07-25 ENCOUNTER — Ambulatory Visit: Admitting: Obstetrics and Gynecology

## 2023-07-25 ENCOUNTER — Encounter: Payer: Self-pay | Admitting: Obstetrics and Gynecology

## 2023-07-25 VITALS — BP 100/59 | HR 70

## 2023-07-25 DIAGNOSIS — N3281 Overactive bladder: Secondary | ICD-10-CM | POA: Diagnosis not present

## 2023-07-25 DIAGNOSIS — N952 Postmenopausal atrophic vaginitis: Secondary | ICD-10-CM

## 2023-07-25 DIAGNOSIS — N3946 Mixed incontinence: Secondary | ICD-10-CM

## 2023-07-25 DIAGNOSIS — N819 Female genital prolapse, unspecified: Secondary | ICD-10-CM

## 2023-07-25 DIAGNOSIS — N814 Uterovaginal prolapse, unspecified: Secondary | ICD-10-CM

## 2023-07-25 NOTE — Progress Notes (Signed)
 [facility]   Subjective:      Chief Complaint:  Chief Complaint  Patient presents with   Follow-up    Cassandra Ho is a 78 y.o. female  pessary check   History of Present Illness: Cassandra Ho is a 79 y.o. female with stage I pelvic organ prolapse who presents for a pessary check. She is using a size #0 cube pessary. The pessary has been working well and she has no complaints. She is using vaginal estrogen. She denies vaginal bleeding.  Past Medical History: Patient  has a past medical history of Allergic rhinitis, Arthritis, BCC (basal cell carcinoma of skin), Constipation, Dyslipidemia, Edema of both lower extremities, GERD (gastroesophageal reflux disease), H/O: pituitary tumor, Headache, High cholesterol, Hypertension, Hypothyroidism, Hypothyroidism (acquired), Joint pain, Morbid obesity (HCC), OSA (obstructive sleep apnea), Osteoarthritis, Toxic solitary thyroid  nodule, and Transfusion history.   Past Surgical History: She  has a past surgical history that includes Carpal tunnel release (Bilateral); Knee arthroscopy (Right); Breast surgery; Ankle Arthrotomy (Left); Hip fracture surgery; Colonoscopy with propofol  (N/A, 06/15/2014); Breast biopsy (Bilateral); torn meniscus; Dilation and curettage of uterus (1973); Dilation and curettage of uterus (1974); Dilation and curettage of uterus (1975); D&C (1977); Dilation and curettage of uterus (1978); Dilation and curettage of uterus (1991); IUD removal (2000); TRANSESOPHAGEAL ECHOCARDIOGRAM (N/A, 03/14/2023); and CARDIOVERSION (N/A, 03/14/2023).   Medications: She has a current medication list which includes the following prescription(s): acetaminophen , apixaban , conjugated estrogens , duloxetine, empagliflozin , famotidine , furosemide , levothyroxine , loratadine , metoprolol  succinate, psyllium, rosuvastatin , entresto , spironolactone , and trazodone.   Allergies: Patient has no known allergies.   Social  History: Patient  reports that she has quit smoking. Her smoking use included cigarettes. She has never used smokeless tobacco. She reports current alcohol use. She reports that she does not use drugs.      Objective:     Physical Exam: BP (!) 100/59   Pulse 70  Gen: No apparent distress, A&O x 3. Detailed Urogynecologic Evaluation:  Pelvic Exam: Normal external female genitalia; Bartholin's and Skene's glands normal in appearance; urethral meatus normal in appearance, no urethral masses or discharge. The pessary was noted to be in place. It was removed and cleaned. Speculum exam revealed no lesions in the vagina. The pessary was replaced. It was comfortable to the patient and fit well.    Assessment/Plan:    Assessment: Cassandra Ho is a 78 y.o. with stage I pelvic organ prolapse and OAB here for a pessary check. She is doing well.  Plan: She will keep the pessary in place until next visit. She will continue to use estrogen. She will follow-up in 3 months for a pessary check or sooner as needed.  All questions were answered.

## 2023-07-25 NOTE — Patient Instructions (Signed)
 Continue estrogen x2 weekly.   Please call if you notice bleeding, increased irritation, or feel things are not supportive.

## 2023-08-05 ENCOUNTER — Ambulatory Visit: Payer: Medicare Other | Admitting: Cardiology

## 2023-08-05 ENCOUNTER — Other Ambulatory Visit (HOSPITAL_COMMUNITY): Payer: Medicare Other

## 2023-08-05 DIAGNOSIS — G8929 Other chronic pain: Secondary | ICD-10-CM | POA: Diagnosis not present

## 2023-08-05 DIAGNOSIS — M25561 Pain in right knee: Secondary | ICD-10-CM | POA: Diagnosis not present

## 2023-08-05 DIAGNOSIS — M25562 Pain in left knee: Secondary | ICD-10-CM | POA: Diagnosis not present

## 2023-08-05 DIAGNOSIS — M170 Bilateral primary osteoarthritis of knee: Secondary | ICD-10-CM | POA: Diagnosis not present

## 2023-08-05 NOTE — Progress Notes (Deleted)
  Cardiology Office Note:   Date:  08/05/2023  ID:  Cassandra Ho, DOB August 13, 1945, MRN [id number] PCP: Annabell Key, [place]  E, PA  [facility] Providers Cardiologist:  None Electrophysiologist:  Efraim Grange, MD { Click to update primary MD,subspecialty MD or APP then REFRESH:1}   History of Present Illness:   Cassandra Ho is a 78 y.o. female ***  Discussed the use of AI scribe software for clinical note transcription with the patient, who gave verbal consent to proceed.  History of Present Illness      Today patient denies chest pain, shortness of breath, lower extremity edema, fatigue, palpitations, melena, hematuria, hemoptysis, diaphoresis, weakness, presyncope, syncope, orthopnea, and PND.   Studies Reviewed:    EKG:        ***  Risk Assessment/Calculations:   {Does this patient have ATRIAL FIBRILLATION?:[id number]} No BP recorded.  {Refresh Note OR Click here to enter BP  :1}***   STOP-Bang Score:  6  { Consider Dx Sleep Disordered Breathing or Sleep Apnea  ICD G47.33          :1}     Physical Exam:   VS:  There were no vitals taken for this visit.   Wt Readings from Last 3 Encounters:  07/10/23 282 lb (127.9 kg)  06/20/23 282 lb 3.2 oz (128 kg)  05/31/23 282 lb (127.9 kg)     Physical Exam  Physical Exam    ASSESSMENT AND PLAN:     Assessment and Plan Assessment & Plan          {Are you ordering a CV Procedure (e.g. stress test, cath, DCCV, TEE, etc)?   Press F2        :[id number]}   Signed, Leala Prince, PA-C

## 2023-08-06 ENCOUNTER — Ambulatory Visit: Payer: Medicare Other | Admitting: Cardiology

## 2023-08-12 ENCOUNTER — Ambulatory Visit: Attending: Cardiology

## 2023-08-12 DIAGNOSIS — I4891 Unspecified atrial fibrillation: Secondary | ICD-10-CM | POA: Diagnosis not present

## 2023-08-12 LAB — CUP PACEART REMOTE DEVICE CHECK
Date Time Interrogation Session: 20250623002800
Implantable Pulse Generator Implant Date: 20250521
Pulse Gen Serial Number: 146174

## 2023-08-14 ENCOUNTER — Other Ambulatory Visit: Payer: Self-pay

## 2023-08-14 ENCOUNTER — Ambulatory Visit (HOSPITAL_BASED_OUTPATIENT_CLINIC_OR_DEPARTMENT_OTHER): Attending: Pain Medicine | Admitting: Physical Therapy

## 2023-08-14 ENCOUNTER — Encounter (HOSPITAL_BASED_OUTPATIENT_CLINIC_OR_DEPARTMENT_OTHER): Payer: Self-pay | Admitting: Physical Therapy

## 2023-08-14 DIAGNOSIS — M25562 Pain in left knee: Secondary | ICD-10-CM | POA: Diagnosis not present

## 2023-08-14 DIAGNOSIS — M25561 Pain in right knee: Secondary | ICD-10-CM | POA: Diagnosis not present

## 2023-08-14 DIAGNOSIS — R2681 Unsteadiness on feet: Secondary | ICD-10-CM | POA: Diagnosis not present

## 2023-08-14 DIAGNOSIS — R262 Difficulty in walking, not elsewhere classified: Secondary | ICD-10-CM | POA: Diagnosis not present

## 2023-08-14 DIAGNOSIS — G8929 Other chronic pain: Secondary | ICD-10-CM | POA: Insufficient documentation

## 2023-08-14 DIAGNOSIS — M6281 Muscle weakness (generalized): Secondary | ICD-10-CM | POA: Diagnosis not present

## 2023-08-14 NOTE — Therapy (Signed)
 OUTPATIENT PHYSICAL THERAPY LOWER EXTREMITY EVALUATION   Patient Name: Cassandra Ho MRN: [id number] DOB:1945/12/31, 78 y.o., female Today's Date: 08/14/2023  END OF SESSION:  PT End of Session - 08/14/23 1155     Visit Number 1    Number of Visits 16    Date for PT Re-Evaluation 10/11/23    Authorization Type [employer] mcr    Progress Note Due on Visit 10    PT Start Time 1120    PT Stop Time 1159    PT Time Calculation (min) 39 min    Activity Tolerance Patient tolerated treatment well    Behavior During Therapy WFL for tasks assessed/performed          Past Medical History:  Diagnosis Date   Allergic rhinitis    Arthritis    generalized-ankles and knees   BCC (basal cell carcinoma of skin)    Constipation    Dyslipidemia    Edema of both lower extremities    GERD (gastroesophageal reflux disease)    H/O: pituitary tumor    irradicated no problems now   Headache    migraines in past   High cholesterol    Hypertension    Hypothyroidism    Hypothyroidism (acquired)    Post-ablation   Joint pain    Morbid obesity (HCC)    OSA (obstructive sleep apnea)    intolerant to CPAP and now using an oral device   Osteoarthritis    Toxic solitary thyroid  nodule    radioactive iodine therapy with 31.4 mCi of I-131 on 12/05/2007   Transfusion history    with hip surgery   Past Surgical History:  Procedure Laterality Date   ANKLE ARTHROTOMY Left    ankle and foot reconstruction ;retained hardware   BREAST BIOPSY Bilateral    Many years ago, no scar seen    BREAST SURGERY     x2 biopsies(benign)   CARDIOVERSION N/A 03/14/2023   Procedure: CARDIOVERSION;  Surgeon: Raford Riggs, MD;  [facility] INVASIVE CV LAB;  Service: Cardiovascular;  Laterality: N/A;   CARPAL TUNNEL RELEASE Bilateral    COLONOSCOPY WITH PROPOFOL  N/A 06/15/2014   Procedure: COLONOSCOPY WITH PROPOFOL ;  Surgeon: Gladis MARLA Louder, MD;  Location: [facility];  Service: Endoscopy;  Laterality:  N/A;   D&C  1977   DILATION AND CURETTAGE OF UTERUS  1973   SAB   DILATION AND CURETTAGE OF UTERUS  1974   DILATION AND CURETTAGE OF UTERUS  1975   DILATION AND CURETTAGE OF UTERUS  1978   DILATION AND CURETTAGE OF UTERUS  1991   HIP FRACTURE SURGERY     repaired with cader bone and metal plates   IUD REMOVAL  2000   hysteroscopy   KNEE ARTHROSCOPY Right    torn meniscus     TRANSESOPHAGEAL ECHOCARDIOGRAM (CATH LAB) N/A 03/14/2023   Procedure: TRANSESOPHAGEAL ECHOCARDIOGRAM;  Surgeon: Raford Riggs, MD;  Location: [facility] INVASIVE CV LAB;  Service: Cardiovascular;  Laterality: N/A;   Patient Active Problem List   Diagnosis Date Noted   PAF (paroxysmal atrial fibrillation) (HCC) 03/22/2023   Obesity, class 3 03/13/2023   Hypothyroid 03/13/2023   Chronic heart failure with preserved ejection fraction (HFpEF) (HCC) 03/13/2023   Chronic HFrEF (heart failure with reduced ejection fraction) (HCC) 03/13/2023   Acute respiratory failure with hypoxia (HCC) 03/10/2023   Acute hypoxemic respiratory failure (HCC) 03/10/2023   Hypokalemia 03/09/2023   Atrial fibrillation with RVR (HCC) 03/08/2023   Acute on chronic systolic CHF (congestive  heart failure) (HCC) 03/08/2023   Iatrogenic hyperthyroidism 03/08/2023   Urinary incontinence, mixed 01/07/2023   Vaginal erosion secondary to pessary use (HCC) 01/07/2023   Female genital prolapse 01/07/2023   Vaginal atrophy 01/07/2023   History of postmenopausal bleeding 01/07/2023   Nocturia 01/07/2023   Constipation 01/07/2023   Class 3 severe obesity with serious comorbidity and body mass index (BMI) of 45.0 to 49.9 in adult 11/27/2021   Vitamin D  deficiency 06/07/2021   Insulin  resistance 06/07/2021   OSA (obstructive sleep apnea) 10/16/2013   Dyslipidemia 10/16/2013   Obesity 10/16/2013   Essential hypertension, benign 10/16/2013   Fitting and adjustment of pessary 10/16/2013    PCP: [place]  Cleotilde PA  REFERRING PROVIDER: Catherene Toribio Pac, MD   REFERRING DIAG: M17.0 (ICD-10-CM) - Bilateral primary osteoarthritis of knee   THERAPY DIAG:  Bilateral chronic knee pain  Muscle weakness (generalized)  Difficulty in walking, not elsewhere classified  Unsteadiness on feet  Rationale for Evaluation and Treatment: Rehabilitation  ONSET DATE: chronic  SUBJECTIVE:   SUBJECTIVE STATEMENT: Had a virus attack my heart in jan cannot get knee replacements for 11 months. Left knee> pain than right. 10 years ago left ankle reconstruction  Fell off of ladder ~ 70yrs ago and fx left femur.  Left knee goes out and I fall  PERTINENT HISTORY: Left fx fx 15 yrs ago Ankle arthrotomy 10 yrs ago OA CHF PAIN:  Are you having pain? Yes: NPRS scale: current 4/10; worst 10/10; least Pain location: knee Pain description: ache Aggravating factors: standing 4 minutes; walk <5 min Relieving factors: no OTC drugs due to bleeding risks, ice, resting, sitting  PRECAUTIONS: None  RED FLAGS: None   WEIGHT BEARING RESTRICTIONS: No  FALLS:  Has patient fallen in last 6 months? Yes. Number of falls 1  LIVING ENVIRONMENT: Lives with: lives with their family Lives in: House/apartment Stairs: Yes: Internal: 1 flight not used much to garage 5 steps  steps; on right going up Has following equipment at home: Single point cane, Wheelchair (manual), and transport chair  OCCUPATION: retired  PLOF: Ind with equipment  PATIENT GOALS: decrease pain,maintain mobility  NEXT MD VISIT: next month  OBJECTIVE:  Note: Objective measures were completed at Evaluation unless otherwise noted.  DIAGNOSTIC FINDINGS: OA bilat knees. L>R  PATIENT SURVEYS:  LEFS  Extreme difficulty/unable (0), Quite a bit of difficulty (1), Moderate difficulty (2), Little difficulty (3), No difficulty (4) Survey date:    Any of your usual work, housework or school activities 2  2. Usual hobbies, recreational or sporting activities 3  3. Getting into/out of the  bath 2  4. Walking between rooms 2  5. Putting on socks/shoes 3  6. Squatting  0  7. Lifting an object, like a bag of groceries from the floor 3  8. Performing light activities around your home 3  9. Performing heavy activities around your home 1  10. Getting into/out of a car 3  11. Walking 2 blocks 0  12. Walking 1 mile 0  13. Going up/down 10 stairs (1 flight) 1  14. Standing for 1 hour 0  15.  sitting for 1 hour 4  16. Running on even ground 0  17. Running on uneven ground 0  18. Making sharp turns while running fast 0  19. Hopping  1  20. Rolling over in bed 2  Score total:  30/80     COGNITION: Overall cognitive status: Within functional limits for tasks assessed  SENSATION: WFL   MUSCLE LENGTH: Hamstrings: tested in sitting slightly tight   POSTURE: rounded shoulders and left knee varus deformity  PALPATION: Minor TTP along joint lines bilateral knees  LOWER EXTREMITY ROM:  Active ROM Right eval Left eval  Hip flexion    Hip extension    Hip abduction    Hip adduction    Hip internal rotation    Hip external rotation    Knee flexion 110d 100d  Knee extension 0 0  Ankle dorsiflexion    Ankle plantarflexion    Ankle inversion    Ankle eversion     (Blank rows = not tested)  LOWER EXTREMITY MMT:  MMT Right eval Left eval  Hip flexion 32.9 43.3  Hip extension    Hip abduction    Hip adduction    Hip internal rotation    Hip external rotation    Knee flexion    Knee extension 40.2 48.5  Ankle dorsiflexion    Ankle plantarflexion    Ankle inversion    Ankle eversion     (Blank rows = not tested)    FUNCTIONAL TESTS:  Timed up and go (TUG): uable to tolerate without canes  STS from armed wc: heavy ue support Initial balance with cga  Balance FT: cl supervision  GAIT: Distance walked: 20 ft without ad Assistive device utilized: 2 canes (did not bring with her today) Level of assistance: CGA Comments: Antalgic gait:Offloading R  and L, minimal knee flex during swing, hip circumduction to advance le                                                                                                                                TREATMENT  Eval Self care:Posture and Optometrist handout and instruction    PATIENT EDUCATION:  Education details: Discussed eval findings, rehab rationale, aquatic program progression/POC and pools in area. Patient is in agreement  Person educated: Patient and Spouse Education method: Explanation Education comprehension: verbalized understanding  HOME EXERCISE PROGRAM: TBA  ASSESSMENT:  CLINICAL IMPRESSION: Patient is a 78 y.o. f who was seen today for physical therapy evaluation and treatment for oa bilat knees. She presents with husband who assisted her in [facility] to setting.  Pt reports pain in bilateral knees L>R and is wanting TKR but due to a cardiac illness earlier this year is not a candidate for another 11 months.  Her pain sensitivity is high.  She is unable to take pain meds nor anti-inflammatories due to cardiac issue. Exam demonstrates fairly good strength in quads.  Her fall risk is high as demonstrated through difficulty with immediate standing balance, requiring assistance to gain FT position and heavy ue assistance with all transitional movements..  She reports use of bilateral cane with amb in home if not furniture surfing. She did not bring her canes and is unable to tolerate completing other functional tests. Pt has had  recent fall due  to left knee giving out.  She is a good candidate for skilled PT both land and aquatic based.  Plan to use the properties of water initially to initiate movement and encourage muscle length, improve stability/balance and manage pain then transition onto land for increased loading to build strength  OBJECTIVE IMPAIRMENTS: Abnormal gait, decreased activity tolerance, decreased balance, decreased mobility, difficulty walking, decreased ROM,  decreased strength, postural dysfunction, obesity, and pain.   ACTIVITY LIMITATIONS: carrying, lifting, bending, sitting, standing, squatting, sleeping, stairs, transfers, and locomotion level  PARTICIPATION LIMITATIONS: meal prep, cleaning, driving, shopping, community activity, and yard work  PERSONAL FACTORS: Time since onset of injury/illness/exacerbation and 1-2 comorbidities: see Pmhx are also affecting patient's functional outcome.   REHAB POTENTIAL: Good  CLINICAL DECISION MAKING: Evolving/moderate complexity  EVALUATION COMPLEXITY: Moderate   GOALS: Goals reviewed with patient? Yes  SHORT TERM GOALS: Target date: 7/25  Pt will tolerate full aquatic sessions consistently without increase in pain and with improving function to demonstrate good toleration and effectiveness of intervention.  Baseline: Goal status: INITIAL  2.  Pt will negotiate steps entering and exiting pool Baseline:  Goal status: INITIAL  3.  Pt will have information and make decisions on accessing pool in future for management of condtion Baseline:  Goal status: INITIAL    LONG TERM GOALS: Target date: 10/11/23  Pt to improve on LEFS by at least 9 point to demonstrate statistically significant Improvement in function. Baseline:30/80  Goal status: INITIAL  2.  Pt will be indep with final HEP's (land and aquatic as appropriate) for continued management of condition Baseline:  Goal status: INITIAL  3.  Pt will tolerate walking 10 consecutive minutes to demonstrate improved toleration to amb Baseline:  Goal status: INITIAL  4.  Pt will tolerate walking to or from setting (500 ft)  using approp AD and engaging in aquatic therapy session without excessive fatigue or increase in pain to demonstrate improved toleration to activity.  Baseline:  Goal status: INITIAL  5.  Pt will report decrease in pain by at least 50% for improved toleration to activity/quality of life and to demonstrate improved  management of pain. Baseline:  Goal status: INITIAL  6.  Pt will report improvement in functional mobility and ADL's at home Baseline: wants to at least maintain Goal status: INITIAL   PLAN:  PT FREQUENCY: 2x/week  PT DURATION: 8 weeks  PLANNED INTERVENTIONS: 97164- PT Re-evaluation, 97750- Physical Performance Testing, 97110-Therapeutic exercises, 97530- Therapeutic activity, 97112- Neuromuscular re-education, 97535- Self Care, 02859- Manual therapy, [phone]- Gait training, [phone]- Orthotic Initial, [phone]- Aquatic Therapy, [phone]- Electrical stimulation (unattended), [phone]- Ionotophoresis 4mg /ml Dexamethasone , 79439 (1-2 muscles), 20561 (3+ muscles)- Dry Needling, Patient/Family education, Balance training, Stair training, Taping, Joint mobilization, DME instructions, Cryotherapy, and Moist heat  PLAN FOR NEXT SESSION: aquatic for first 3-4 weeks tthe alternate with land   Ronal Kem) Kayani Rapaport MPT 08/14/23 12:18 PM [facility] [address] [place], [place], [address] Phone: [phone]   Fax:  [phone]   Date of referral: 06/04/23 Referring provider:   Catherene Toribio Pac, MD   Referring diagnosis? M17.0 (ICD-10-CM) - Bilateral primary osteoarthritis of knee  Treatment diagnosis? (if different than referring diagnosis) no  What was this (referring dx) caused by? Arthritis  Nature of Condition: Chronic (continuous duration > 3 months)   Laterality: Both  Current Functional Measure Score: LEFS 30/80  Objective measurements identify impairments when they are compared to normal values, the uninvolved extremity, and prior level of  function.  [x]  Yes  []  No  Objective assessment of functional ability: Moderate functional limitations   Briefly describe symptoms: chronic knee pain due to OA  How did symptoms start: chronic pain  Average pain intensity:  Last 24 hours: 4/10  Past week: 4/10  How often does the  pt experience symptoms? Constantly  How much have the symptoms interfered with usual daily activities? Quite a bit  How has condition changed since care began at this facility? NA - initial visit  In general, how is the patients overall health? Good   BACK PAIN (STarT Back Screening Tool) No

## 2023-08-15 ENCOUNTER — Ambulatory Visit: Payer: Self-pay | Admitting: Cardiovascular Disease

## 2023-08-19 ENCOUNTER — Ambulatory Visit (HOSPITAL_BASED_OUTPATIENT_CLINIC_OR_DEPARTMENT_OTHER): Admitting: Physical Therapy

## 2023-08-21 ENCOUNTER — Ambulatory Visit: Admitting: Obstetrics and Gynecology

## 2023-08-21 ENCOUNTER — Encounter: Payer: Self-pay | Admitting: Obstetrics and Gynecology

## 2023-08-21 ENCOUNTER — Ambulatory Visit (HOSPITAL_BASED_OUTPATIENT_CLINIC_OR_DEPARTMENT_OTHER): Admitting: Physical Therapy

## 2023-08-21 VITALS — BP 109/69 | HR 61

## 2023-08-21 DIAGNOSIS — N819 Female genital prolapse, unspecified: Secondary | ICD-10-CM

## 2023-08-21 NOTE — Progress Notes (Signed)
 [facility] Return Visit  SUBJECTIVE  History of Present Illness: Cassandra Ho is a 78 y.o. female seen in follow-up for pessary re-insertion. Patient reports she was walking up the steps and strained and felt it come down. She would like the pessary replaced.     Past Medical History: Patient  has a past medical history of Allergic rhinitis, Arthritis, BCC (basal cell carcinoma of skin), Constipation, Dyslipidemia, Edema of both lower extremities, GERD (gastroesophageal reflux disease), H/O: pituitary tumor, Headache, High cholesterol, Hypertension, Hypothyroidism, Hypothyroidism (acquired), Joint pain, Morbid obesity (HCC), OSA (obstructive sleep apnea), Osteoarthritis, Toxic solitary thyroid  nodule, and Transfusion history.   Past Surgical History: She  has a past surgical history that includes Carpal tunnel release (Bilateral); Knee arthroscopy (Right); Breast surgery; Ankle Arthrotomy (Left); Hip fracture surgery; Colonoscopy with propofol  (N/A, 06/15/2014); Breast biopsy (Bilateral); torn meniscus; Dilation and curettage of uterus (1973); Dilation and curettage of uterus (1974); Dilation and curettage of uterus (1975); D&C (1977); Dilation and curettage of uterus (1978); Dilation and curettage of uterus (1991); IUD removal (2000); TRANSESOPHAGEAL ECHOCARDIOGRAM (N/A, 03/14/2023); and CARDIOVERSION (N/A, 03/14/2023).   Medications: She has a current medication list which includes the following prescription(s): acetaminophen , apixaban , conjugated estrogens , duloxetine, empagliflozin , famotidine , furosemide , levothyroxine , loratadine , metoprolol  succinate, psyllium, rosuvastatin , entresto , spironolactone , and trazodone.   Allergies: Patient has no known allergies.   Social History: Patient  reports that she has quit smoking. Her smoking use included cigarettes. She has never used smokeless tobacco. She reports current alcohol use. She reports that she does not use drugs.      OBJECTIVE     Physical Exam: Vitals:   08/21/23 0840  BP: 109/69  Pulse: 61   Gen: No apparent distress, A&O x 3.  Detailed Urogynecologic Evaluation:  External vaginal tissues mildly irritated. No noted bleeding, mass, or other discharge. Size #0 cube pessary re-inserted without difficulty. Encouraged patient to cough and valsalva and pessary was noted to stay in place.     ASSESSMENT AND PLAN    Ms. Fata is a 78 y.o. with:  1. Female genital prolapse, unspecified type    Pessary replaced today. Patient to continue use of vaginal estrogen cream x2 weekly. Patient to call if any issues and we can consider going up to a size #1 cube if needed.    Patient to follow up in 3 months or sooner if needed.    Angeles Paolucci G Teyton Pattillo, NP

## 2023-08-30 ENCOUNTER — Ambulatory Visit (HOSPITAL_BASED_OUTPATIENT_CLINIC_OR_DEPARTMENT_OTHER): Attending: Pain Medicine | Admitting: Physical Therapy

## 2023-08-30 ENCOUNTER — Encounter (HOSPITAL_BASED_OUTPATIENT_CLINIC_OR_DEPARTMENT_OTHER): Payer: Self-pay | Admitting: Physical Therapy

## 2023-08-30 DIAGNOSIS — M25562 Pain in left knee: Secondary | ICD-10-CM | POA: Diagnosis not present

## 2023-08-30 DIAGNOSIS — R262 Difficulty in walking, not elsewhere classified: Secondary | ICD-10-CM | POA: Insufficient documentation

## 2023-08-30 DIAGNOSIS — M6281 Muscle weakness (generalized): Secondary | ICD-10-CM | POA: Diagnosis not present

## 2023-08-30 DIAGNOSIS — R2681 Unsteadiness on feet: Secondary | ICD-10-CM | POA: Insufficient documentation

## 2023-08-30 DIAGNOSIS — G8929 Other chronic pain: Secondary | ICD-10-CM | POA: Insufficient documentation

## 2023-08-30 DIAGNOSIS — M25561 Pain in right knee: Secondary | ICD-10-CM | POA: Insufficient documentation

## 2023-08-30 NOTE — Therapy (Addendum)
 OUTPATIENT PHYSICAL THERAPY LOWER EXTREMITY TREATMENT   Patient Name: Cassandra Ho MRN: [id number] DOB:[birth year], 78 y.o., female Today's Date: 08/30/2023  END OF SESSION:  PT End of Session - 08/30/23 1519     Visit Number 2    Number of Visits 16    Date for PT Re-Evaluation 10/11/23    Authorization Type [employer] mcr    Authorization Time Period 08/15/23-8/21/225    Authorization - Visit Number 2    Authorization - Number of Visits 12    Progress Note Due on Visit 10    PT Start Time 1505    PT Stop Time 1550    PT Time Calculation (min) 45 min    Behavior During Therapy WFL for tasks assessed/performed          Past Medical History:  Diagnosis Date   Allergic rhinitis    Arthritis    generalized-ankles and knees   BCC (basal cell carcinoma of skin)    Constipation    Dyslipidemia    Edema of both lower extremities    GERD (gastroesophageal reflux disease)    H/O: pituitary tumor    irradicated no problems now   Headache    migraines in past   High cholesterol    Hypertension    Hypothyroidism    Hypothyroidism (acquired)    Post-ablation   Joint pain    Morbid obesity (HCC)    OSA (obstructive sleep apnea)    intolerant to CPAP and now using an oral device   Osteoarthritis    Toxic solitary thyroid  nodule    radioactive iodine therapy with 31.4 mCi of I-131 on 12/05/2007   Transfusion history    with hip surgery   Past Surgical History:  Procedure Laterality Date   ANKLE ARTHROTOMY Left    ankle and foot reconstruction ;retained hardware   BREAST BIOPSY Bilateral    Many years ago, no scar seen    BREAST SURGERY     x2 biopsies(benign)   CARDIOVERSION N/A 03/14/2023   Procedure: CARDIOVERSION;  Surgeon: Raford Riggs, MD;  Location: [facility] INVASIVE CV LAB;  Service: Cardiovascular;  Laterality: N/A;   CARPAL TUNNEL RELEASE Bilateral    COLONOSCOPY WITH PROPOFOL  N/A 06/15/2014   Procedure: COLONOSCOPY WITH PROPOFOL ;  Surgeon: Gladis MARLA Louder,  MD;  Location: [facility] ENDOSCOPY;  Service: Endoscopy;  Laterality: N/A;   D&C  1977   DILATION AND CURETTAGE OF UTERUS  1973   SAB   DILATION AND CURETTAGE OF UTERUS  1974   DILATION AND CURETTAGE OF UTERUS  1975   DILATION AND CURETTAGE OF UTERUS  1978   DILATION AND CURETTAGE OF UTERUS  1991   HIP FRACTURE SURGERY     repaired with cader bone and metal plates   IUD REMOVAL  2000   hysteroscopy   KNEE ARTHROSCOPY Right    torn meniscus     TRANSESOPHAGEAL ECHOCARDIOGRAM (CATH LAB) N/A 03/14/2023   Procedure: TRANSESOPHAGEAL ECHOCARDIOGRAM;  Surgeon: Raford Riggs, MD;  Location: [facility] INVASIVE CV LAB;  Service: Cardiovascular;  Laterality: N/A;   Patient Active Problem List   Diagnosis Date Noted   PAF (paroxysmal atrial fibrillation) (HCC) 03/22/2023   Obesity, class 3 03/13/2023   Hypothyroid 03/13/2023   Chronic heart failure with preserved ejection fraction (HFpEF) (HCC) 03/13/2023   Chronic HFrEF (heart failure with reduced ejection fraction) (HCC) 03/13/2023   Acute respiratory failure with hypoxia (HCC) 03/10/2023   Acute hypoxemic respiratory failure (HCC) 03/10/2023   Hypokalemia 03/09/2023  Atrial fibrillation with RVR (HCC) 03/08/2023   Acute on chronic systolic CHF (congestive heart failure) (HCC) 03/08/2023   Iatrogenic hyperthyroidism 03/08/2023   Urinary incontinence, mixed 01/07/2023   Vaginal erosion secondary to pessary use (HCC) 01/07/2023   Female genital prolapse 01/07/2023   Vaginal atrophy 01/07/2023   History of postmenopausal bleeding 01/07/2023   Nocturia 01/07/2023   Constipation 01/07/2023   Class 3 severe obesity with serious comorbidity and body mass index (BMI) of 45.0 to 49.9 in adult 11/27/2021   Vitamin D  deficiency 06/07/2021   Insulin  resistance 06/07/2021   OSA (obstructive sleep apnea) 10/16/2013   Dyslipidemia 10/16/2013   Obesity 10/16/2013   Essential hypertension, benign 10/16/2013   Fitting and adjustment of pessary 10/16/2013     PCP: Virginia  Cleotilde PA  REFERRING PROVIDER: Catherene Toribio Pac, MD   REFERRING DIAG: M17.0 (ICD-10-CM) - Bilateral primary osteoarthritis of knee   THERAPY DIAG:  Bilateral chronic knee pain  Muscle weakness (generalized)  Difficulty in walking, not elsewhere classified  Unsteadiness on feet  Rationale for Evaluation and Treatment: Rehabilitation  ONSET DATE: chronic  SUBJECTIVE:   SUBJECTIVE STATEMENT: Pt reports no new changes since last visit.   Pt arrives in [facility], pushed by husband. She reports that she uses 2 canes when moving around the house.   POOL ACCESS: currently none.  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 Left knee> pain than right. 10 years ago left ankle reconstruction  Fell off of ladder ~ 77yrs ago and fx left femur.  Left knee goes out and I fall  PERTINENT HISTORY: Left femur fx 15 yrs ago Ankle arthrotomy 10 yrs ago OA CHF PAIN:  Are you having pain? Yes: NPRS scale: current 5/10 Pain location: bil knee Pain description: ache Aggravating factors: standing 4 minutes; walk <5 min Relieving factors: no OTC drugs due to bleeding risks, ice, resting, sitting  PRECAUTIONS: None  RED FLAGS: None   WEIGHT BEARING RESTRICTIONS: No  FALLS:  Has patient fallen in last 6 months? Yes. Number of falls 1  LIVING ENVIRONMENT: Lives with: lives with their family Lives in: House/apartment Stairs: Yes: Internal: 1 flight not used much to garage 5 steps  steps; on right going up Has following equipment at home: Single point cane, Wheelchair (manual), and transport chair  OCCUPATION: retired  PLOF: Ind with equipment  PATIENT GOALS: decrease pain,maintain mobility  NEXT MD VISIT: next month  OBJECTIVE:  Note: Objective measures were completed at Evaluation unless otherwise noted.  DIAGNOSTIC FINDINGS: OA bilat knees. L>R  PATIENT SURVEYS:  LEFS  Extreme difficulty/unable  (0), Quite a bit of difficulty (1), Moderate difficulty (2), Little difficulty (3), No difficulty (4) Survey date:    Any of your usual work, housework or school activities 2  2. Usual hobbies, recreational or sporting activities 3  3. Getting into/out of the bath 2  4. Walking between rooms 2  5. Putting on socks/shoes 3  6. Squatting  0  7. Lifting an object, like a bag of groceries from the floor 3  8. Performing light activities around your home 3  9. Performing heavy activities around your home 1  10. Getting into/out of a car 3  11. Walking 2 blocks 0  12. Walking 1 mile 0  13. Going up/down 10 stairs (1 flight) 1  14. Standing for 1 hour 0  15.  sitting for 1 hour 4  16. Running on even  ground 0  17. Running on uneven ground 0  18. Making sharp turns while running fast 0  19. Hopping  1  20. Rolling over in bed 2  Score total:  30/80     COGNITION: Overall cognitive status: Within functional limits for tasks assessed     SENSATION: WFL   MUSCLE LENGTH: Hamstrings: tested in sitting slightly tight   POSTURE: rounded shoulders and left knee varus deformity  PALPATION: Minor TTP along joint lines bilateral knees  LOWER EXTREMITY ROM:  Active ROM Right eval Left eval  Hip flexion    Hip extension    Hip abduction    Hip adduction    Hip internal rotation    Hip external rotation    Knee flexion 110d 100d  Knee extension 0 0  Ankle dorsiflexion    Ankle plantarflexion    Ankle inversion    Ankle eversion     (Blank rows = not tested)  LOWER EXTREMITY MMT:  MMT Right eval Left eval  Hip flexion 32.9 43.3  Hip extension    Hip abduction    Hip adduction    Hip internal rotation    Hip external rotation    Knee flexion    Knee extension 40.2 48.5  Ankle dorsiflexion    Ankle plantarflexion    Ankle inversion    Ankle eversion     (Blank rows = not tested)    FUNCTIONAL TESTS:  Timed up and go (TUG): uable to tolerate without  canes  STS from armed wc: heavy ue support Initial balance with cga  Balance FT: cl supervision  GAIT: Distance walked: 20 ft without ad Assistive device utilized: 2 canes (did not bring with her today) Level of assistance: CGA Comments: Antalgic gait:Offloading R and L, minimal knee flex during swing, hip circumduction to advance le                                                                                                                                TREATMENT  [facility] Adult PT Treatment:                                             Date: 08/30/23 Pt seen for aquatic therapy today.  Treatment took place in water 3.5-4.75 ft in depth at the [facility] pool. Temp of water was 91.  Pt entered the pool via stairs in side-step motion leading with RLE with BUE on rail, exited the pool via lift chair.  - Intro to aquatic therapy principles - unsupported walking forward/ backward - one episode buckling/locking in Lt knee - at wall- gentle marching in place - UE on barbell:  side stepping -> side marching;  forward walking small kick (painful) forward/ backward marching (painful)  - at bench in water:  alternating LAQ; hip  abdct/ add; flutter kick (gentle); cycle;  STS with feet on blue step with forward arm reach x 8 - straddling noodle with UE support on corner -> hand floats -> corner: cycling  - straddling noodle with UE on corner: hip abdct/ add; cross country ski; cycling  Pt requires the buoyancy and hydrostatic pressure of water for support, and to offload joints by unweighting joint load by at least 50 % in navel deep water and by at least 75-80% in chest to neck deep water.  Viscosity of the water is needed for resistance of strengthening. Water current perturbations provides challenge to standing balance requiring increased core activation.       PATIENT EDUCATION:  Education details: intro to aquatic therapy  Person educated: Patient and Spouse Education method:  Explanation Education comprehension: verbalized understanding  HOME EXERCISE PROGRAM: TBA  ASSESSMENT:  CLINICAL IMPRESSION: Pt demonstrates safety and independence in aquatic setting with therapist instructing from deck. Pt demonstrates confidence in setting, moving throughout all depths easily.  Pt is directed through various movement patterns and trials in both sitting and standing positions. Pt reported some locking/catching/bucking in Lt knee with initial forward/ backward gait; improved with brief rest period at wall.  Pt reported gradual reduction of knee pain.  Used lift to exit pool to avoid aggravating knees upon conclusion of session.   Goals are ongoing.     Initial impression:  Patient is a 78 y.o. f who was seen today for physical therapy evaluation and treatment for oa bilat knees. She presents with husband who assisted her in [facility] to setting.  Pt reports pain in bilateral knees L>R and is wanting TKR but due to a cardiac illness earlier this year is not a candidate for another 11 months.  Her pain sensitivity is high.  She is unable to take pain meds nor anti-inflammatories due to cardiac issue. Exam demonstrates fairly good strength in quads.  Her fall risk is high as demonstrated through difficulty with immediate standing balance, requiring assistance to gain FT position and heavy ue assistance with all transitional movements..  She reports use of bilateral cane with amb in home if not furniture surfing. She did not bring her canes and is unable to tolerate completing other functional tests. Pt has had  recent fall due to left knee giving out.  She is a good candidate for skilled PT both land and aquatic based.  Plan to use the properties of water initially to initiate movement and encourage muscle length, improve stability/balance and manage pain then transition onto land for increased loading to build strength  OBJECTIVE IMPAIRMENTS: Abnormal gait, decreased activity tolerance,  decreased balance, decreased mobility, difficulty walking, decreased ROM, decreased strength, postural dysfunction, obesity, and pain.   ACTIVITY LIMITATIONS: carrying, lifting, bending, sitting, standing, squatting, sleeping, stairs, transfers, and locomotion level  PARTICIPATION LIMITATIONS: meal prep, cleaning, driving, shopping, community activity, and yard work  PERSONAL FACTORS: Time since onset of injury/illness/exacerbation and 1-2 comorbidities: see Pmhx are also affecting patient's functional outcome.   REHAB POTENTIAL: Good  CLINICAL DECISION MAKING: Evolving/moderate complexity  EVALUATION COMPLEXITY: Moderate   GOALS: Goals reviewed with patient? Yes  SHORT TERM GOALS: Target date: 09/13/23  Pt will tolerate full aquatic sessions consistently without increase in pain and with improving function to demonstrate good toleration and effectiveness of intervention.  Baseline: Goal status: INITIAL  2.  Pt will negotiate steps entering and exiting pool Baseline:  Goal status: INITIAL  3.  Pt will have information and make  decisions on accessing pool in future for management of condtion Baseline:  Goal status: INITIAL    LONG TERM GOALS: Target date: 10/11/23  Pt to improve on LEFS by at least 9 point to demonstrate statistically significant Improvement in function. Baseline:30/80  Goal status: INITIAL  2.  Pt will be indep with final HEP's (land and aquatic as appropriate) for continued management of condition Baseline:  Goal status: INITIAL  3.  Pt will tolerate walking 10 consecutive minutes to demonstrate improved toleration to amb Baseline:  Goal status: INITIAL  4.  Pt will tolerate walking to or from setting (500 ft)  using approp AD and engaging in aquatic therapy session without excessive fatigue or increase in pain to demonstrate improved toleration to activity.  Baseline:  Goal status: INITIAL  5.  Pt will report decrease in pain by at least 50% for  improved toleration to activity/quality of life and to demonstrate improved management of pain. Baseline:  Goal status: INITIAL  6.  Pt will report improvement in functional mobility and ADL's at home Baseline: wants to at least maintain Goal status: INITIAL   PLAN:  PT FREQUENCY: 2x/week  PT DURATION: 8 weeks  PLANNED INTERVENTIONS: 97164- PT Re-evaluation, 97750- Physical Performance Testing, 97110-Therapeutic exercises, 97530- Therapeutic activity, 97112- Neuromuscular re-education, 97535- Self Care, 02859- Manual therapy, [id number]- Gait training, [phone]- Orthotic Initial, [phone]- Aquatic Therapy, [phone]- Electrical stimulation (unattended), [phone]- Ionotophoresis 4mg /ml Dexamethasone , 79439 (1-2 muscles), 20561 (3+ muscles)- Dry Needling, Patient/Family education, Balance training, Stair training, Taping, Joint mobilization, DME instructions, Cryotherapy, and Moist heat  Approved codes (insurance auth): therapeutic exercise, neuro -re ed; gait training; manual therapy; therapeutic activity; self care; performance testing; aquatic therapy; mechanical traction; US ; orthotic fit; vaso; PT re-eval.   PLAN FOR NEXT SESSION: aquatic for first 3-4 weeks tthe alternate with land  Delon Aquas, PTA 08/30/23 4:10 PM [facility] [address] [place], [place], [address] Phone: [phone]   Fax:  [phone]    Date of referral: 06/04/23 Referring provider:   Catherene Toribio Pac, MD   Referring diagnosis? M17.0 (ICD-10-CM) - Bilateral primary osteoarthritis of knee  Treatment diagnosis? (if different than referring diagnosis) no  What was this (referring dx) caused by? Arthritis  Nature of Condition: Chronic (continuous duration > 3 months)   Laterality: Both  Current Functional Measure Score: LEFS 30/80  Objective measurements identify impairments when they are compared to normal values, the uninvolved extremity, and  prior level of function.  [x]  Yes  []  No  Objective assessment of functional ability: Moderate functional limitations   Briefly describe symptoms: chronic knee pain due to OA  How did symptoms start: chronic pain  Average pain intensity:  Last 24 hours: 4/10  Past week: 4/10  How often does the pt experience symptoms? Constantly  How much have the symptoms interfered with usual daily activities? Quite a bit  How has condition changed since care began at this facility? NA - initial visit  In general, how is the patients overall health? Good   BACK PAIN (STarT Back Screening Tool) No

## 2023-09-04 ENCOUNTER — Encounter (HOSPITAL_BASED_OUTPATIENT_CLINIC_OR_DEPARTMENT_OTHER): Payer: Self-pay | Admitting: Physical Therapy

## 2023-09-04 ENCOUNTER — Encounter: Payer: Self-pay | Admitting: Cardiology

## 2023-09-04 ENCOUNTER — Ambulatory Visit (HOSPITAL_BASED_OUTPATIENT_CLINIC_OR_DEPARTMENT_OTHER): Admitting: Physical Therapy

## 2023-09-04 DIAGNOSIS — M25562 Pain in left knee: Secondary | ICD-10-CM | POA: Diagnosis not present

## 2023-09-04 DIAGNOSIS — M6281 Muscle weakness (generalized): Secondary | ICD-10-CM | POA: Diagnosis not present

## 2023-09-04 DIAGNOSIS — G8929 Other chronic pain: Secondary | ICD-10-CM

## 2023-09-04 DIAGNOSIS — R262 Difficulty in walking, not elsewhere classified: Secondary | ICD-10-CM

## 2023-09-04 DIAGNOSIS — R2681 Unsteadiness on feet: Secondary | ICD-10-CM | POA: Diagnosis not present

## 2023-09-04 DIAGNOSIS — M25561 Pain in right knee: Secondary | ICD-10-CM | POA: Diagnosis not present

## 2023-09-04 NOTE — Therapy (Signed)
 OUTPATIENT PHYSICAL THERAPY LOWER EXTREMITY TREATMENT   Patient Name: Cassandra Ho MRN: [id number] DOB:09-21-1945, 78 y.o., female Today's Date: 09/04/2023  END OF SESSION:  PT End of Session - 09/04/23 1102     Visit Number 3    Number of Visits 16    Date for PT Re-Evaluation 10/11/23    Authorization Type [employer] mcr    Authorization Time Period 08/15/23-8/21/225    Authorization - Visit Number 3    Authorization - Number of Visits 12    Progress Note Due on Visit 10    PT Start Time 1101    PT Stop Time 1145    PT Time Calculation (min) 44 min    Activity Tolerance Patient tolerated treatment well    Behavior During Therapy WFL for tasks assessed/performed          Past Medical History:  Diagnosis Date   Allergic rhinitis    Arthritis    generalized-ankles and knees   BCC (basal cell carcinoma of skin)    Constipation    Dyslipidemia    Edema of both lower extremities    GERD (gastroesophageal reflux disease)    H/O: pituitary tumor    irradicated no problems now   Headache    migraines in past   High cholesterol    Hypertension    Hypothyroidism    Hypothyroidism (acquired)    Post-ablation   Joint pain    Morbid obesity (HCC)    OSA (obstructive sleep apnea)    intolerant to CPAP and now using an oral device   Osteoarthritis    Toxic solitary thyroid  nodule    radioactive iodine therapy with 31.4 mCi of I-131 on 12/05/2007   Transfusion history    with hip surgery   Past Surgical History:  Procedure Laterality Date   ANKLE ARTHROTOMY Left    ankle and foot reconstruction ;retained hardware   BREAST BIOPSY Bilateral    Many years ago, no scar seen    BREAST SURGERY     x2 biopsies(benign)   CARDIOVERSION N/A 03/14/2023   Procedure: CARDIOVERSION;  Surgeon: Raford Riggs, MD;  Location: [facility] INVASIVE CV LAB;  Service: Cardiovascular;  Laterality: N/A;   CARPAL TUNNEL RELEASE Bilateral    COLONOSCOPY WITH PROPOFOL  N/A 06/15/2014    Procedure: COLONOSCOPY WITH PROPOFOL ;  Surgeon: Gladis MARLA Louder, MD;  Location: [facility];  Service: Endoscopy;  Laterality: N/A;   D&C  1977   DILATION AND CURETTAGE OF UTERUS  1973   SAB   DILATION AND CURETTAGE OF UTERUS  1974   DILATION AND CURETTAGE OF UTERUS  1975   DILATION AND CURETTAGE OF UTERUS  1978   DILATION AND CURETTAGE OF UTERUS  1991   HIP FRACTURE SURGERY     repaired with cader bone and metal plates   IUD REMOVAL  2000   hysteroscopy   KNEE ARTHROSCOPY Right    torn meniscus     TRANSESOPHAGEAL ECHOCARDIOGRAM (CATH LAB) N/A 03/14/2023   Procedure: TRANSESOPHAGEAL ECHOCARDIOGRAM;  Surgeon: Raford Riggs, MD;  Location: [facility] INVASIVE CV LAB;  Service: Cardiovascular;  Laterality: N/A;   Patient Active Problem List   Diagnosis Date Noted   PAF (paroxysmal atrial fibrillation) (HCC) 03/22/2023   Obesity, class 3 03/13/2023   Hypothyroid 03/13/2023   Chronic heart failure with preserved ejection fraction (HFpEF) (HCC) 03/13/2023   Chronic HFrEF (heart failure with reduced ejection fraction) (HCC) 03/13/2023   Acute respiratory failure with hypoxia (HCC) 03/10/2023   Acute hypoxemic  respiratory failure (HCC) 03/10/2023   Hypokalemia 03/09/2023   Atrial fibrillation with RVR (HCC) 03/08/2023   Acute on chronic systolic CHF (congestive heart failure) (HCC) 03/08/2023   Iatrogenic hyperthyroidism 03/08/2023   Urinary incontinence, mixed 01/07/2023   Vaginal erosion secondary to pessary use (HCC) 01/07/2023   Female genital prolapse 01/07/2023   Vaginal atrophy 01/07/2023   History of postmenopausal bleeding 01/07/2023   Nocturia 01/07/2023   Constipation 01/07/2023   Class 3 severe obesity with serious comorbidity and body mass index (BMI) of 45.0 to 49.9 in adult 11/27/2021   Vitamin D  deficiency 06/07/2021   Insulin  resistance 06/07/2021   OSA (obstructive sleep apnea) 10/16/2013   Dyslipidemia 10/16/2013   Obesity 10/16/2013   Essential hypertension,  benign 10/16/2013   Fitting and adjustment of pessary 10/16/2013    PCP: [place]  Cleotilde PA  REFERRING PROVIDER: Catherene Toribio Pac, MD   REFERRING DIAG: M17.0 (ICD-10-CM) - Bilateral primary osteoarthritis of knee   THERAPY DIAG:  Bilateral chronic knee pain  Muscle weakness (generalized)  Difficulty in walking, not elsewhere classified  Unsteadiness on feet  Rationale for Evaluation and Treatment: Rehabilitation  ONSET DATE: chronic  SUBJECTIVE:   SUBJECTIVE STATEMENT:   Pt arrives in [facility], pushed by husband. She reports tiredness after last session with initial slight uptick in pain a few hours then decrease in pain the next day.  POOL ACCESS: currently none.  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 Left knee> pain than right. 10 years ago left ankle reconstruction  Fell off of ladder ~ 84yrs ago and fx left femur.  Left knee goes out and I fall  PERTINENT HISTORY: Left femur fx 15 yrs ago Ankle arthrotomy 10 yrs ago OA CHF PAIN:  Are you having pain? Yes: NPRS scale: current 5/10 Pain location: bil knee Pain description: ache Aggravating factors: standing 4 minutes; walk <5 min Relieving factors: no OTC drugs due to bleeding risks, ice, resting, sitting  PRECAUTIONS: None  RED FLAGS: None   WEIGHT BEARING RESTRICTIONS: No  FALLS:  Has patient fallen in last 6 months? Yes. Number of falls 1  LIVING ENVIRONMENT: Lives with: lives with their family Lives in: House/apartment Stairs: Yes: Internal: 1 flight not used much to garage 5 steps  steps; on right going up Has following equipment at home: Single point cane, Wheelchair (manual), and transport chair  OCCUPATION: retired  PLOF: Ind with equipment  PATIENT GOALS: decrease pain,maintain mobility  NEXT MD VISIT: next month  OBJECTIVE:  Note: Objective measures were completed at Evaluation unless otherwise noted.  DIAGNOSTIC  FINDINGS: OA bilat knees. L>R  PATIENT SURVEYS:  LEFS  Extreme difficulty/unable (0), Quite a bit of difficulty (1), Moderate difficulty (2), Little difficulty (3), No difficulty (4) Survey date:    Any of your usual work, housework or school activities 2  2. Usual hobbies, recreational or sporting activities 3  3. Getting into/out of the bath 2  4. Walking between rooms 2  5. Putting on socks/shoes 3  6. Squatting  0  7. Lifting an object, like a bag of groceries from the floor 3  8. Performing light activities around your home 3  9. Performing heavy activities around your home 1  10. Getting into/out of a car 3  11. Walking 2 blocks 0  12. Walking 1 mile 0  13. Going up/down 10 stairs (1 flight) 1  14. Standing for 1 hour 0  15.  sitting for 1 hour 4  16. Running on even ground 0  17. Running on uneven ground 0  18. Making sharp turns while running fast 0  19. Hopping  1  20. Rolling over in bed 2  Score total:  30/80     COGNITION: Overall cognitive status: Within functional limits for tasks assessed     SENSATION: WFL   MUSCLE LENGTH: Hamstrings: tested in sitting slightly tight   POSTURE: rounded shoulders and left knee varus deformity  PALPATION: Minor TTP along joint lines bilateral knees  LOWER EXTREMITY ROM:  Active ROM Right eval Left eval  Hip flexion    Hip extension    Hip abduction    Hip adduction    Hip internal rotation    Hip external rotation    Knee flexion 110d 100d  Knee extension 0 0  Ankle dorsiflexion    Ankle plantarflexion    Ankle inversion    Ankle eversion     (Blank rows = not tested)  LOWER EXTREMITY MMT:  MMT Right eval Left eval  Hip flexion 32.9 43.3  Hip extension    Hip abduction    Hip adduction    Hip internal rotation    Hip external rotation    Knee flexion    Knee extension 40.2 48.5  Ankle dorsiflexion    Ankle plantarflexion    Ankle inversion    Ankle eversion     (Blank rows = not  tested)    FUNCTIONAL TESTS:  Timed up and go (TUG): uable to tolerate without canes  STS from armed wc: heavy ue support Initial balance with cga  Balance FT: cl supervision  GAIT: Distance walked: 20 ft without ad Assistive device utilized: 2 canes (did not bring with her today) Level of assistance: CGA Comments: Antalgic gait:Offloading R and L, minimal knee flex during swing, hip circumduction to advance le                                                                                                                                TREATMENT  [facility] Adult PT Treatment:                                             Date: 09/02/23 Pt seen for aquatic therapy today.  Treatment took place in water 3.5-4.75 ft in depth at the [facility] pool. Temp of water was 91.  Pt entered the pool via stairs in side-step motion leading with RLE with BUE on rail, exited the pool via lift chair.  - UE on barbell:  forward/backward and side stepping 3.7 ft - at wall 3.8->4.2 ft: toe raises; heel raises (left ankle fused); HS curls; hip add/abd; hip extension.  (Multiple squatting rest periods due to complaints of left knee burning, mostly resolves with increased  depth of water.) -Straddling noodle ue support corner wall: hip add/abd; hip flex/extension   --cycling across pool ue support HB - at bench in water:  alternating LAQ -4.3 in: tandem stance leading L then right ue support YHB x 20s   --SLS as above x 20s each.  Vc for core stability to assist with balance -3.40ft: tandem stance ue support RBHB ->shoulder add/abd x 5 ea leading R/L  Pt requires the buoyancy and hydrostatic pressure of water for support, and to offload joints by unweighting joint load by at least 50 % in navel deep water and by at least 75-80% in chest to neck deep water.  Viscosity of the water is needed for resistance of strengthening. Water current perturbations provides challenge to standing balance requiring  increased core activation.       PATIENT EDUCATION:  Education details: intro to aquatic therapy  Person educated: Patient and Spouse Education method: Explanation Education comprehension: verbalized understanding  HOME EXERCISE PROGRAM: TBA  ASSESSMENT:  CLINICAL IMPRESSION: Pt reports good response to first aquatic session with just some residual tiredness.   No pain or excessive fatigue. Progressed exercises today adding new and increasing reps of last sessions. She does report burning in stance leg requiring squatted rest periods throughout standing exercises completed in 3.8 ft with improved toleration with increased depth.  Able to gain sitting balance straddling noodle with cues for core stability cycling for non-loaded hip and knee movement. Good toleration to session with reduction of pain to ~ 2/10     Initial impression:  Patient is a 78 y.o. f who was seen today for physical therapy evaluation and treatment for oa bilat knees. She presents with husband who assisted her in [facility] to setting.  Pt reports pain in bilateral knees L>R and is wanting TKR but due to a cardiac illness earlier this year is not a candidate for another 11 months.  Her pain sensitivity is high.  She is unable to take pain meds nor anti-inflammatories due to cardiac issue. Exam demonstrates fairly good strength in quads.  Her fall risk is high as demonstrated through difficulty with immediate standing balance, requiring assistance to gain FT position and heavy ue assistance with all transitional movements..  She reports use of bilateral cane with amb in home if not furniture surfing. She did not bring her canes and is unable to tolerate completing other functional tests. Pt has had  recent fall due to left knee giving out.  She is a good candidate for skilled PT both land and aquatic based.  Plan to use the properties of water initially to initiate movement and encourage muscle length, improve  stability/balance and manage pain then transition onto land for increased loading to build strength  OBJECTIVE IMPAIRMENTS: Abnormal gait, decreased activity tolerance, decreased balance, decreased mobility, difficulty walking, decreased ROM, decreased strength, postural dysfunction, obesity, and pain.   ACTIVITY LIMITATIONS: carrying, lifting, bending, sitting, standing, squatting, sleeping, stairs, transfers, and locomotion level  PARTICIPATION LIMITATIONS: meal prep, cleaning, driving, shopping, community activity, and yard work  PERSONAL FACTORS: Time since onset of injury/illness/exacerbation and 1-2 comorbidities: see Pmhx are also affecting patient's functional outcome.   REHAB POTENTIAL: Good  CLINICAL DECISION MAKING: Evolving/moderate complexity  EVALUATION COMPLEXITY: Moderate   GOALS: Goals reviewed with patient? Yes  SHORT TERM GOALS: Target date: 09/13/23  Pt will tolerate full aquatic sessions consistently without increase in pain and with improving function to demonstrate good toleration and effectiveness of intervention.  Baseline: Goal status: INITIAL  2.  Pt will negotiate steps entering and exiting pool Baseline:  Goal status: INITIAL  3.  Pt will have information and make decisions on accessing pool in future for management of condtion Baseline:  Goal status: INITIAL    LONG TERM GOALS: Target date: 10/11/23  Pt to improve on LEFS by at least 9 point to demonstrate statistically significant Improvement in function. Baseline:30/80  Goal status: INITIAL  2.  Pt will be indep with final HEP's (land and aquatic as appropriate) for continued management of condition Baseline:  Goal status: INITIAL  3.  Pt will tolerate walking 10 consecutive minutes to demonstrate improved toleration to amb Baseline:  Goal status: INITIAL  4.  Pt will tolerate walking to or from setting (500 ft)  using approp AD and engaging in aquatic therapy session without excessive  fatigue or increase in pain to demonstrate improved toleration to activity.  Baseline:  Goal status: INITIAL  5.  Pt will report decrease in pain by at least 50% for improved toleration to activity/quality of life and to demonstrate improved management of pain. Baseline:  Goal status: INITIAL  6.  Pt will report improvement in functional mobility and ADL's at home Baseline: wants to at least maintain Goal status: INITIAL   PLAN:  PT FREQUENCY: 2x/week  PT DURATION: 8 weeks  PLANNED INTERVENTIONS: 97164- PT Re-evaluation, 97750- Physical Performance Testing, 97110-Therapeutic exercises, 97530- Therapeutic activity, 97112- Neuromuscular re-education, 97535- Self Care, 02859- Manual therapy, [phone]- Gait training, [phone]- Orthotic Initial, [phone]- Aquatic Therapy, [phone]- Electrical stimulation (unattended), [phone]- Ionotophoresis 4mg /ml Dexamethasone , 20560 (1-2 muscles), 20561 (3+ muscles)- Dry Needling, Patient/Family education, Balance training, Stair training, Taping, Joint mobilization, DME instructions, Cryotherapy, and Moist heat  Approved codes (insurance auth): therapeutic exercise, neuro -re ed; gait training; manual therapy; therapeutic activity; self care; performance testing; aquatic therapy; mechanical traction; US ; orthotic fit; vaso; PT re-eval.   PLAN FOR NEXT SESSION: aquatic for first 3-4 weeks tthe alternate with land  Ronal Kem) Darrian Grzelak MPT 09/04/23 11:08 AM [facility] [address] [place], [place], [address] Phone: [phone]   Fax:  [phone]     Date of referral: 06/04/23 Referring provider:   Catherene Toribio Pac, MD   Referring diagnosis? M17.0 (ICD-10-CM) - Bilateral primary osteoarthritis of knee  Treatment diagnosis? (if different than referring diagnosis) no  What was this (referring dx) caused by? Arthritis  Nature of Condition: Chronic (continuous duration > 3 months)   Laterality:  Both  Current Functional Measure Score: LEFS 30/80  Objective measurements identify impairments when they are compared to normal values, the uninvolved extremity, and prior level of function.  [x]  Yes  []  No  Objective assessment of functional ability: Moderate functional limitations   Briefly describe symptoms: chronic knee pain due to OA  How did symptoms start: chronic pain  Average pain intensity:  Last 24 hours: 4/10  Past week: 4/10  How often does the pt experience symptoms? Constantly  How much have the symptoms interfered with usual daily activities? Quite a bit  How has condition changed since care began at this facility? NA - initial visit  In general, how is the patients overall health? Good   BACK PAIN (STarT Back Screening Tool) No

## 2023-09-11 ENCOUNTER — Encounter (HOSPITAL_BASED_OUTPATIENT_CLINIC_OR_DEPARTMENT_OTHER): Payer: Self-pay | Admitting: Physical Therapy

## 2023-09-11 ENCOUNTER — Ambulatory Visit (HOSPITAL_BASED_OUTPATIENT_CLINIC_OR_DEPARTMENT_OTHER): Admitting: Physical Therapy

## 2023-09-11 DIAGNOSIS — M6281 Muscle weakness (generalized): Secondary | ICD-10-CM | POA: Diagnosis not present

## 2023-09-11 DIAGNOSIS — R2681 Unsteadiness on feet: Secondary | ICD-10-CM

## 2023-09-11 DIAGNOSIS — G8929 Other chronic pain: Secondary | ICD-10-CM

## 2023-09-11 DIAGNOSIS — R262 Difficulty in walking, not elsewhere classified: Secondary | ICD-10-CM

## 2023-09-11 DIAGNOSIS — M25562 Pain in left knee: Secondary | ICD-10-CM | POA: Diagnosis not present

## 2023-09-11 DIAGNOSIS — M25561 Pain in right knee: Secondary | ICD-10-CM | POA: Diagnosis not present

## 2023-09-11 NOTE — Progress Notes (Unsigned)
 Electrophysiology Office Note:    Date:  09/12/2023   ID:  Cassandra Ho, DOB [birth year], MRN [id number] PCP:  Cleotilde, [place]  E, PA   [facility] Providers Cardiologist:  None Electrophysiologist:  Eulas FORBES Furbish, MD     Referring MD: Cleotilde, [place]  E, PA   History of Present Illness:    Cassandra Ho is a 78 y.o. female with a medical history significant for atrial fibrillation, CHF with reduced ejection fraction, obstructive sleep apnea, history of hyperthyroidism with ablation of toxic nodule referred for management of atrial fibrillation.      Discussed the use of AI scribe software for clinical note transcription with the patient, who gave verbal consent to proceed.  History of Present Illness Cassandra Ho is a 78 year old female with atrial fibrillation who presents for evaluation and management of her condition. She was referred by Dr. Vergil to discuss management options for atrial fibrillation.  In January, she experienced a severe upper respiratory infection lasting three weeks, during which she felt too ill to seek medical attention. Eventually, she went to urgent care due to difficulty breathing and was found to be in atrial fibrillation, leading to a seven-day hospital stay. Amiodarone  was started.  She is currently on amiodarone  for atrial fibrillation. She also takes metoprolol  and furosemide  for heart-related issues. She has a history of decreased heart function, which has shown improvement based on a recent cardiac MRI.  She has a history of thyroid  issues, having had her thyroid  radiated 15 years ago.   She mentions difficulty with mobility due to knee problems and is seeing a pain doctor. Her BMI is 45, which poses additional risks for certain procedures.         Today, she reports she is doing well -- she has never had palpitations and was unaware of her AF. She presents today for placement of a loop recorder.    EKGs/Labs/Other Studies Reviewed Today:     Echocardiogram:  TTE March 10, 2023 Ejection fraction 30%.  Otherwise, normal structure and function     Advanced imaging:  Cardiac MRI 05/27/2023 EF 54%.  Small area of mid wall LGE at the apical septum.  Possibly prior myocarditis  EKG:   EKG Interpretation Date/Time:  Thursday September 12 2023 09:42:53 EDT Ventricular Rate:  68 PR Interval:  154 QRS Duration:  92 QT Interval:  410 QTC Calculation: 435 R Axis:   -31  Text Interpretation: Normal sinus rhythm Left axis deviation When compared with ECG of 10-Jul-2023 11:52, Minimal criteria for Anterior infarct are no longer Present Confirmed by Furbish Eulas [phone]) on 09/12/2023 9:52:40 AM     Physical Exam:    VS:  BP 120/64 (BP Location: Left Arm, Patient Position: Sitting, Cuff Size: Large)   Pulse 68   Ht 5' 6 (1.676 m)   Wt 282 lb (127.9 kg)   SpO2 96%   BMI 45.52 kg/m     Wt Readings from Last 3 Encounters:  09/12/23 282 lb (127.9 kg)  07/10/23 282 lb (127.9 kg)  06/20/23 282 lb 3.2 oz (128 kg)     GEN:  Well nourished, well developed in no acute distress; morbidly obese CARDIAC: RRR, no murmurs, rubs, gallops RESPIRATORY:  Normal work of breathing MUSCULOSKELETAL: trace edema    ASSESSMENT & PLAN:     Persistent atrial fibrillation Occurred in the setting of acute illness January 2025 Monitoring with [facility] ILR No symptoms other than CHF I recommended rhythm control,  particularly due to occurrence of CHF with RVR She is not a candidate for ablation currently; her BMI is 45.52 today In the interim, prefer to have her on a safer medication for long-term use and amiodarone  It has been about 3 months since discontinuation of amiodarone  --EKG and labs reviewed today.  Will plan for Tikosyn load.  Pharmacy will review medications.  The indication and risks of Tikosyn been discussed with the patient.   Morbid obesity Precludes ablation at  this time We discussed the importance of weight loss for maintenance of sinus rhythm and general health Exercise is limited by osteoarthritis  Heart failure with recovered ejection fraction Continue Entresto  49-51, spironolactone  25, Jardiance  10 mg  Secondary hypercoagulable state Continue Eliquis  5 mg twice daily      Signed, Eulas FORBES Furbish, MD  09/12/2023 9:54 AM    [facility]

## 2023-09-11 NOTE — Therapy (Signed)
 OUTPATIENT PHYSICAL THERAPY LOWER EXTREMITY TREATMENT   Patient Name: Cassandra Ho MRN: [id number] DOB:[birth year], 78 y.o., female Today's Date: 09/11/2023  END OF SESSION:  PT End of Session - 09/11/23 1059     Visit Number 4    Number of Visits 16    Date for PT Re-Evaluation 10/11/23    Authorization Type [employer] mcr    Authorization Time Period 08/15/23-8/21/225    Authorization - Number of Visits 12    Progress Note Due on Visit 10    PT Start Time 1101    PT Stop Time 1143    PT Time Calculation (min) 42 min    Activity Tolerance Patient tolerated treatment well    Behavior During Therapy WFL for tasks assessed/performed          Past Medical History:  Diagnosis Date   Allergic rhinitis    Arthritis    generalized-ankles and knees   BCC (basal cell carcinoma of skin)    Constipation    Dyslipidemia    Edema of both lower extremities    GERD (gastroesophageal reflux disease)    H/O: pituitary tumor    irradicated no problems now   Headache    migraines in past   High cholesterol    Hypertension    Hypothyroidism    Hypothyroidism (acquired)    Post-ablation   Joint pain    Morbid obesity (HCC)    OSA (obstructive sleep apnea)    intolerant to CPAP and now using an oral device   Osteoarthritis    Toxic solitary thyroid  nodule    radioactive iodine therapy with 31.4 mCi of I-131 on 12/05/2007   Transfusion history    with hip surgery   Past Surgical History:  Procedure Laterality Date   ANKLE ARTHROTOMY Left    ankle and foot reconstruction ;retained hardware   BREAST BIOPSY Bilateral    Many years ago, no scar seen    BREAST SURGERY     x2 biopsies(benign)   CARDIOVERSION N/A 03/14/2023   Procedure: CARDIOVERSION;  Surgeon: Raford Riggs, MD;  Location: [facility] INVASIVE CV LAB;  Service: Cardiovascular;  Laterality: N/A;   CARPAL TUNNEL RELEASE Bilateral    COLONOSCOPY WITH PROPOFOL  N/A 06/15/2014   Procedure: COLONOSCOPY WITH PROPOFOL ;   Surgeon: Gladis MARLA Louder, MD;  Location: [facility] END[facility]COPY;  Service: Endoscopy;  Laterality: N/A;   D&C  1977   DILATION AND CURETTAGE OF UTERUS  1973   SAB   DILATION AND CURETTAGE OF UTERUS  1974   DILATION AND CURETTAGE OF UTERUS  1975   DILATION AND CURETTAGE OF UTERUS  1978   DILATION AND CURETTAGE OF UTERUS  1991   HIP FRACTURE SURGERY     repaired with cader bone and metal plates   IUD REMOVAL  2000   hysteroscopy   KNEE ARTHROSCOPY Right    torn meniscus     TRANSESOPHAGEAL ECHOCARDIOGRAM (CATH LAB) N/A 03/14/2023   Procedure: TRANSESOPHAGEAL ECHOCARDIOGRAM;  Surgeon: Raford Riggs, MD;  Location: [facility] INVASIVE CV LAB;  Service: Cardiovascular;  Laterality: N/A;   Patient Active Problem List   Diagnosis Date Noted   PAF (paroxysmal atrial fibrillation) (HCC) 03/22/2023   Obesity, class 3 03/13/2023   Hypothyroid 03/13/2023   Chronic heart failure with preserved ejection fraction (HFpEF) (HCC) 03/13/2023   Chronic HFrEF (heart failure with reduced ejection fraction) (HCC) 03/13/2023   Acute respiratory failure with hypoxia (HCC) 03/10/2023   Acute hypoxemic respiratory failure (HCC) 03/10/2023   Hypokalemia 03/09/2023  Atrial fibrillation with RVR (HCC) 03/08/2023   Acute on chronic systolic CHF (congestive heart failure) (HCC) 03/08/2023   Iatrogenic hyperthyroidism 03/08/2023   Urinary incontinence, mixed 01/07/2023   Vaginal erosion secondary to pessary use (HCC) 01/07/2023   Female genital prolapse 01/07/2023   Vaginal atrophy 01/07/2023   History of postmenopausal bleeding 01/07/2023   Nocturia 01/07/2023   Constipation 01/07/2023   Class 3 severe obesity with serious comorbidity and body mass index (BMI) of 45.0 to 49.9 in adult 11/27/2021   Vitamin D  deficiency 06/07/2021   Insulin  resistance 06/07/2021   OSA (obstructive sleep apnea) 10/16/2013   Dyslipidemia 10/16/2013   Obesity 10/16/2013   Essential hypertension, benign 10/16/2013   Fitting and adjustment  of pessary 10/16/2013    PCP: Virginia  Cleotilde PA  REFERRING PROVIDER: Catherene Toribio Pac, MD   REFERRING DIAG: M17.0 (ICD-10-CM) - Bilateral primary osteoarthritis of knee   THERAPY DIAG:  Bilateral chronic knee pain  Muscle weakness (generalized)  Difficulty in walking, not elsewhere classified  Unsteadiness on feet  Rationale for Evaluation and Treatment: Rehabilitation  ONSET DATE: chronic  SUBJECTIVE:   SUBJECTIVE STATEMENT:   Pt arrives in [facility], pushed by husband.  [place]   POOL ACCESS: currently none.  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 Left knee> pain than right. 10 years ago left ankle reconstruction  Fell off of ladder ~ 42yrs ago and fx left femur.  Left knee goes out and I fall  PERTINENT HISTORY: Left femur fx 15 yrs ago Ankle arthrotomy 10 yrs ago OA CHF PAIN:  Are you having pain? Yes: NPRS scale: current 2/10 Pain location: bil knee Pain description: ache Aggravating factors: standing 4 minutes; walk <5 min Relieving factors: no OTC drugs due to bleeding risks, ice, resting, sitting  PRECAUTIONS: None  RED FLAGS: None   WEIGHT BEARING RESTRICTIONS: No  FALLS:  Has patient fallen in last 6 months? Yes. Number of falls 1  LIVING ENVIRONMENT: Lives with: lives with their family Lives in: House/apartment Stairs: Yes: Internal: 1 flight not used much to garage 5 steps  steps; on right going up Has following equipment at home: Single point cane, Wheelchair (manual), and transport chair  OCCUPATION: retired  PLOF: Ind with equipment  PATIENT GOALS: decrease pain,maintain mobility  NEXT MD VISIT: next month  OBJECTIVE:  Note: Objective measures were completed at Evaluation unless otherwise noted.  DIAGNOSTIC FINDINGS: OA bilat knees. L>R  PATIENT SURVEYS:  LEFS  Extreme difficulty/unable (0), Quite a bit of  difficulty (1), Moderate difficulty (2), Little difficulty (3), No difficulty (4) Survey date:    Any of your usual work, housework or school activities 2  2. Usual hobbies, recreational or sporting activities 3  3. Getting into/out of the bath 2  4. Walking between rooms 2  5. Putting on socks/shoes 3  6. Squatting  0  7. Lifting an object, like a bag of groceries from the floor 3  8. Performing light activities around your home 3  9. Performing heavy activities around your home 1  10. Getting into/out of a car 3  11. Walking 2 blocks 0  12. Walking 1 mile 0  13. Going up/down 10 stairs (1 flight) 1  14. Standing for 1 hour 0  15.  sitting for 1 hour 4  16. Running on even ground 0  17. Running on uneven  ground 0  18. Making sharp turns while running fast 0  19. Hopping  1  20. Rolling over in bed 2  Score total:  30/80     COGNITION: Overall cognitive status: Within functional limits for tasks assessed     SENSATION: WFL   MUSCLE LENGTH: Hamstrings: tested in sitting slightly tight   POSTURE: rounded shoulders and left knee varus deformity  PALPATION: Minor TTP along joint lines bilateral knees  LOWER EXTREMITY ROM:  Active ROM Right eval Left eval  Hip flexion    Hip extension    Hip abduction    Hip adduction    Hip internal rotation    Hip external rotation    Knee flexion 110d 100d  Knee extension 0 0  Ankle dorsiflexion    Ankle plantarflexion    Ankle inversion    Ankle eversion     (Blank rows = not tested)  LOWER EXTREMITY MMT:  MMT Right eval Left eval  Hip flexion 32.9 43.3  Hip extension    Hip abduction    Hip adduction    Hip internal rotation    Hip external rotation    Knee flexion    Knee extension 40.2 48.5  Ankle dorsiflexion    Ankle plantarflexion    Ankle inversion    Ankle eversion     (Blank rows = not tested)    FUNCTIONAL TESTS:  Timed up and go (TUG): uable to tolerate without canes  STS from armed wc:  heavy ue support Initial balance with cga  Balance FT: cl supervision  GAIT: Distance walked: 20 ft without ad Assistive device utilized: 2 canes (did not bring with her today) Level of assistance: CGA Comments: Antalgic gait:Offloading R and L, minimal knee flex during swing, hip circumduction to advance le                                                                                                                                TREATMENT  [facility] Adult PT Treatment:                                             Date: 09/10/23 Pt seen for aquatic therapy today.  Treatment took place in water 3.5-4.75 ft in depth at the [facility] pool. Temp of water was 91.  Pt entered the pool via stairs in side-step motion leading with RLE with BUE on rail, exited the pool via lift chair.  - UE on barbell:  forward/backward and side stepping 3.7 ft - ue support barbells standing 3.8 ft: toe raises; heel raises (left ankle fused); HS curls; hip add/abd; hip extension.   (Multiple squatting rest periods due to left knee burning) -4.3 in: tandem stance leading L then right ue support YHB x 20s   --SLS as above x 20s  each.  Vc for core stability to assist with balance -HS and gastroc stretching at step 20s hold x 3 ea LE -Straddling noodle ue support corner wall:  hip flex/extension (good quad stretch)   --cycling across pool ue support HB    Pt requires the buoyancy and hydrostatic pressure of water for support, and to offload joints by unweighting joint load by at least 50 % in navel deep water and by at least 75-80% in chest to neck deep water.  Viscosity of the water is needed for resistance of strengthening. Water current perturbations provides challenge to standing balance requiring increased core activation.       PATIENT EDUCATION:  Education details: intro to aquatic therapy  Person educated: Patient and Spouse Education method: Explanation Education comprehension: verbalized  understanding  HOME EXERCISE PROGRAM: TBA  ASSESSMENT:  CLINICAL IMPRESSION: Movement submerged completed with increasing confidence and improving balance/proprioception. Added in stretching today for hs and gastroc lengthening which increases burning in right knee. She is provided multiple rest/recovery periods.Cycling on noodle reduces burning. Visually upon exiting pool bilateral knee extension improved. Pt encouraged to ice if need be to alleviate any residual pain from todays session. She has been entering pool using steps but exiting via lift due to fatigue at end of sessions. No decisions have been made for pool access post episode.  All STG addressed.     Initial impression:  Patient is a 78 y.o. f who was seen today for physical therapy evaluation and treatment for oa bilat knees. She presents with husband who assisted her in [facility] to setting.  Pt reports pain in bilateral knees L>R and is wanting TKR but due to a cardiac illness earlier this year is not a candidate for another 11 months.  Her pain sensitivity is high.  She is unable to take pain meds nor anti-inflammatories due to cardiac issue. Exam demonstrates fairly good strength in quads.  Her fall risk is high as demonstrated through difficulty with immediate standing balance, requiring assistance to gain FT position and heavy ue assistance with all transitional movements..  She reports use of bilateral cane with amb in home if not furniture surfing. She did not bring her canes and is unable to tolerate completing other functional tests. Pt has had  recent fall due to left knee giving out.  She is a good candidate for skilled PT both land and aquatic based.  Plan to use the properties of water initially to initiate movement and encourage muscle length, improve stability/balance and manage pain then transition onto land for increased loading to build strength  OBJECTIVE IMPAIRMENTS: Abnormal gait, decreased activity tolerance,  decreased balance, decreased mobility, difficulty walking, decreased ROM, decreased strength, postural dysfunction, obesity, and pain.   ACTIVITY LIMITATIONS: carrying, lifting, bending, sitting, standing, squatting, sleeping, stairs, transfers, and locomotion level  PARTICIPATION LIMITATIONS: meal prep, cleaning, driving, shopping, community activity, and yard work  PERSONAL FACTORS: Time since onset of injury/illness/exacerbation and 1-2 comorbidities: see Pmhx are also affecting patient's functional outcome.   REHAB POTENTIAL: Good  CLINICAL DECISION MAKING: Evolving/moderate complexity  EVALUATION COMPLEXITY: Moderate   GOALS: Goals reviewed with patient? Yes  SHORT TERM GOALS: Target date: 09/13/23  Pt will tolerate full aquatic sessions consistently without increase in pain and with improving function to demonstrate good toleration and effectiveness of intervention.  Baseline: Goal status: Met 09/11/23  2.  Pt will negotiate steps entering and exiting pool Baseline:  Goal status: In progress 09/11/23  3.  Pt will have  information and make decisions on accessing pool in future for management of condtion Baseline:  Goal status: In progress 09/11/23    LONG TERM GOALS: Target date: 10/11/23  Pt to improve on LEFS by at least 9 point to demonstrate statistically significant Improvement in function. Baseline:30/80  Goal status: INITIAL  2.  Pt will be indep with final HEP's (land and aquatic as appropriate) for continued management of condition Baseline:  Goal status: INITIAL  3.  Pt will tolerate walking 10 consecutive minutes to demonstrate improved toleration to amb Baseline:  Goal status: INITIAL  4.  Pt will tolerate walking to or from setting (500 ft)  using approp AD and engaging in aquatic therapy session without excessive fatigue or increase in pain to demonstrate improved toleration to activity.  Baseline:  Goal status: INITIAL  5.  Pt will report decrease in  pain by at least 50% for improved toleration to activity/quality of life and to demonstrate improved management of pain. Baseline:  Goal status: INITIAL  6.  Pt will report improvement in functional mobility and ADL's at home Baseline: wants to at least maintain Goal status: INITIAL   PLAN:  PT FREQUENCY: 2x/week  PT DURATION: 8 weeks  PLANNED INTERVENTIONS: 97164- PT Re-evaluation, 97750- Physical Performance Testing, 97110-Therapeutic exercises, 97530- Therapeutic activity, 97112- Neuromuscular re-education, 97535- Self Care, 02859- Manual therapy, [phone]- Gait training, [phone]- Orthotic Initial, [phone]- Aquatic Therapy, [phone]- Electrical stimulation (unattended), [phone]- Ionotophoresis 4mg /ml Dexamethasone , 79439 (1-2 muscles), 20561 (3+ muscles)- Dry Needling, Patient/Family education, Balance training, Stair training, Taping, Joint mobilization, DME instructions, Cryotherapy, and Moist heat  Approved codes (insurance auth): therapeutic exercise, neuro -re ed; gait training; manual therapy; therapeutic activity; self care; performance testing; aquatic therapy; mechanical traction; US ; orthotic fit; vaso; PT re-eval.   PLAN FOR NEXT SESSION: aquatic for first 3-4 weeks tthe alternate with land  Ronal Kem) Olon Russ MPT 09/11/23 11:00 AM [facility] [address] [place], [place], [address] Phone: [phone]   Fax:  [phone]     Date of referral: 06/04/23 Referring provider:   Catherene Toribio Pac, MD   Referring diagnosis? M17.0 (ICD-10-CM) - Bilateral primary osteoarthritis of knee  Treatment diagnosis? (if different than referring diagnosis) no  What was this (referring dx) caused by? Arthritis  Nature of Condition: Chronic (continuous duration > 3 months)   Laterality: Both  Current Functional Measure Score: LEFS 30/80  Objective measurements identify impairments when they are compared to normal values, the  uninvolved extremity, and prior level of function.  [x]  Yes  []  No  Objective assessment of functional ability: Moderate functional limitations   Briefly describe symptoms: chronic knee pain due to OA  How did symptoms start: chronic pain  Average pain intensity:  Last 24 hours: 4/10  Past week: 4/10  How often does the pt experience symptoms? Constantly  How much have the symptoms interfered with usual daily activities? Quite a bit  How has condition changed since care began at this facility? NA - initial visit  In general, how is the patients overall health? Good   BACK PAIN (STarT Back Screening Tool) No

## 2023-09-12 ENCOUNTER — Ambulatory Visit

## 2023-09-12 ENCOUNTER — Ambulatory Visit: Attending: Cardiovascular Disease | Admitting: Cardiovascular Disease

## 2023-09-12 ENCOUNTER — Encounter: Payer: Self-pay | Admitting: Cardiovascular Disease

## 2023-09-12 DIAGNOSIS — I4891 Unspecified atrial fibrillation: Secondary | ICD-10-CM

## 2023-09-12 DIAGNOSIS — I5022 Chronic systolic (congestive) heart failure: Secondary | ICD-10-CM | POA: Diagnosis not present

## 2023-09-12 DIAGNOSIS — I480 Paroxysmal atrial fibrillation: Secondary | ICD-10-CM | POA: Diagnosis not present

## 2023-09-12 LAB — CUP PACEART REMOTE DEVICE CHECK
Date Time Interrogation Session: 20250724103200
Implantable Pulse Generator Implant Date: 20250521
Pulse Gen Serial Number: 146174

## 2023-09-12 NOTE — Patient Instructions (Addendum)
 Medication Instructions:  Your physician recommends that you continue on your current medications as directed. Please refer to the Current Medication list given to you today.  *If you need a refill on your cardiac medications before your next appointment, please call your pharmacy*  Lab Work: None ordered.  You may go to any [facility] Location for your lab work:  [facility] - [id number] ([facility]) - [address] [phone] [address]  [place] - [address]   [place]  - [address]   [place] - [address] - [address] [employer]  - [address] [place] ([facility]   If you have labs (blood work) drawn today and your tests are completely normal, you will receive your results only by: [employer] (if you have MyChart)  If you have any lab test that is abnormal or we need to change your treatment, we will call you or send a MyChart message to review the results.  Testing/Procedures: Dofetilide (Tikosyn) Hospital Admission  Preparing for admission:  Check with drug insurance company for cost of drug to ensure affordability --- Price check the generic of Tikosyn which is Dofetilide 500 mcg twice a day.   GoodRx is an option if insurance copay is unaffordable. Patient assistance is also available.  A pharmacist will review all your medications for potential interactions with Tikosyn. If any medication changes are needed prior to admission we will be in touch with you.  If any new medications are started AFTER your admission date is set with [place]ade RN [phone]). Please notify our office immediately so your medication list can be updated and reviewed by our pharmacist again.  Please ensure no missed doses of your anticoagulation (blood thinner) for 3 weeks prior to admission. If a dose is missed please notify our office immediately.   If you are on coumadin/warfarin we will require 4 weekly therapeutic INR checks prior to admission.  No Benadryl is allowed 3 days prior to admission.   On day of admission:  [facility] office visit will be scheduled on the morning of admission for preliminary labs and EKG.  Please take your morning medications and have a normal breakfast prior to arrival to the appointment.     Tikosyn initiation requires a 3 night/4 day hospital stay with constant telemetry monitoring. You will have an EKG after each dose of Tikosyn as well as daily lab draws.  You may bring personal belongings/clothing with you to the hospital. Please leave your suitcase in the car until you arrive in admissions.  If the drug does not convert you to normal rhythm a cardioversion will be performed after the 4th dose of Tikosyn.   Time of admission is dependent on bed availability in the hospital. In some instances, you will be sent home until bed is available. Rarely admission can be delayed to the following day if hospital census prevents available beds.  Questions please call our office at [phone]   Follow-Up: At [facility], you and your health needs are our priority.  As part of our continuing mission to provide you with exceptional heart care, we have created designated Provider Care Teams.  These Care Teams include your primary Cardiologist (physician) and Advanced Practice Providers (APPs -  Physician Assistants and Nurse Practitioners) who all work together to provide you with the care you need,  when you need it.  We recommend signing up for the patient portal called MyChart.  Sign up information is provided on this After Visit Summary.  MyChart is used to connect with patients for Virtual Visits (Telemedicine).  Patients are able to view lab/test results, encounter notes, upcoming appointments, etc.  Non-urgent messages can be sent to your provider as well.   To learn more about what you can do with  MyChart, go to [email or web address].    Your next appointment:   To be determined

## 2023-09-13 ENCOUNTER — Encounter: Payer: Self-pay | Admitting: Obstetrics and Gynecology

## 2023-09-13 ENCOUNTER — Telehealth: Payer: Self-pay | Admitting: Pharmacist

## 2023-09-13 NOTE — Telephone Encounter (Signed)
 Medication list reviewed in anticipation of upcoming Tikosyn initiation. Patient is not taking any contraindicated but is taking 3 QTc prolonging medications.   Furosemide :  may increase QTc-prolonging effects of Dofetilide due to its effects on potassium and magnesium. Please ensure close monitoring of potassium, magnesium and renal function. Please ensure K is 4-5 and Magnesium >2.  Spironolactone : Spironolactone  may increase serum concentration of CYP3A4 Substrates (Tikosyn) increasing the risk of toxicity and QTc prolongation  Trazodone: Indeterminate QTc prolonging risk- may increase the QTc longing effect of Tikosyn. Chart says as needed. I would find out if patient takes this nightly or if she takes it on occasion.   Patient is anticoagulated on Eliquis  on the appropriate dose. Please ensure that patient has not missed any anticoagulation doses in the 3 weeks prior to Tikosyn initiation.   Patient will need to be counseled to avoid use of Benadryl while on Tikosyn and in the 2-3 days prior to Tikosyn initiation.  Amiodarone  was stopped > 3 months ago. Level not required.

## 2023-09-13 NOTE — Telephone Encounter (Signed)
 Patient instructed to contact PCP regarding changing trazodone to different agent.

## 2023-09-16 ENCOUNTER — Telehealth (HOSPITAL_COMMUNITY): Payer: Self-pay

## 2023-09-16 NOTE — Telephone Encounter (Signed)
 Initiated prior authorization for Tikosyn admission. Date of service: 10/01/2023 Pending authorization # [id number] [facility] will reach out for clinical information.

## 2023-09-16 NOTE — Progress Notes (Signed)
 Bsx Loop Recorder

## 2023-09-18 ENCOUNTER — Encounter (HOSPITAL_BASED_OUTPATIENT_CLINIC_OR_DEPARTMENT_OTHER): Payer: Self-pay | Admitting: Physical Therapy

## 2023-09-18 ENCOUNTER — Ambulatory Visit (HOSPITAL_BASED_OUTPATIENT_CLINIC_OR_DEPARTMENT_OTHER): Admitting: Physical Therapy

## 2023-09-18 DIAGNOSIS — R262 Difficulty in walking, not elsewhere classified: Secondary | ICD-10-CM

## 2023-09-18 DIAGNOSIS — R2681 Unsteadiness on feet: Secondary | ICD-10-CM | POA: Diagnosis not present

## 2023-09-18 DIAGNOSIS — G8929 Other chronic pain: Secondary | ICD-10-CM | POA: Diagnosis not present

## 2023-09-18 DIAGNOSIS — M6281 Muscle weakness (generalized): Secondary | ICD-10-CM

## 2023-09-18 DIAGNOSIS — M25561 Pain in right knee: Secondary | ICD-10-CM | POA: Diagnosis not present

## 2023-09-18 DIAGNOSIS — M25562 Pain in left knee: Secondary | ICD-10-CM | POA: Diagnosis not present

## 2023-09-18 NOTE — Telephone Encounter (Signed)
 Once patient is admitted [facility] will require pre certification for Tikosyn admission.  Authorization # [id number]

## 2023-09-18 NOTE — Therapy (Signed)
 OUTPATIENT PHYSICAL THERAPY LOWER EXTREMITY TREATMENT   Patient Name: Cassandra Ho MRN: [id number] DOB:[birth year], 78 y.o., female Today's Date: 09/18/2023  END OF SESSION:  PT End of Session - 09/18/23 1409     Visit Number 5    Number of Visits 16    Date for PT Re-Evaluation 10/11/23    Authorization Type [employer] mcr    Authorization Time Period 08/15/23-8/21/225    Authorization - Visit Number 5    Authorization - Number of Visits 12    Progress Note Due on Visit 10    PT Start Time 1402    PT Stop Time 1445    PT Time Calculation (min) 43 min    Activity Tolerance Patient tolerated treatment well    Behavior During Therapy WFL for tasks assessed/performed          Past Medical History:  Diagnosis Date   Allergic rhinitis    Arthritis    generalized-ankles and knees   BCC (basal cell carcinoma of skin)    Constipation    Dyslipidemia    Edema of both lower extremities    GERD (gastroesophageal reflux disease)    H/O: pituitary tumor    irradicated no problems now   Headache    migraines in past   High cholesterol    Hypertension    Hypothyroidism    Hypothyroidism (acquired)    Post-ablation   Joint pain    Morbid obesity (HCC)    OSA (obstructive sleep apnea)    intolerant to CPAP and now using an oral device   Osteoarthritis    Toxic solitary thyroid  nodule    radioactive iodine therapy with 31.4 mCi of I-131 on 12/05/2007   Transfusion history    with hip surgery   Past Surgical History:  Procedure Laterality Date   ANKLE ARTHROTOMY Left    ankle and foot reconstruction ;retained hardware   BREAST BIOPSY Bilateral    Many years ago, no scar seen    BREAST SURGERY     x2 biopsies(benign)   CARDIOVERSION N/A 03/14/2023   Procedure: CARDIOVERSION;  Surgeon: Raford Riggs, MD;  Location: [facility] INVASIVE CV LAB;  Service: Cardiovascular;  Laterality: N/A;   CARPAL TUNNEL RELEASE Bilateral    COLONOSCOPY WITH PROPOFOL  N/A 06/15/2014    Procedure: COLONOSCOPY WITH PROPOFOL ;  Surgeon: Gladis MARLA Louder, MD;  Location: [facility];  Service: Endoscopy;  Laterality: N/A;   D&C  1977   DILATION AND CURETTAGE OF UTERUS  1973   SAB   DILATION AND CURETTAGE OF UTERUS  1974   DILATION AND CURETTAGE OF UTERUS  1975   DILATION AND CURETTAGE OF UTERUS  1978   DILATION AND CURETTAGE OF UTERUS  1991   HIP FRACTURE SURGERY     repaired with cader bone and metal plates   IUD REMOVAL  2000   hysteroscopy   KNEE ARTHROSCOPY Right    torn meniscus     TRANSESOPHAGEAL ECHOCARDIOGRAM (CATH LAB) N/A 03/14/2023   Procedure: TRANSESOPHAGEAL ECHOCARDIOGRAM;  Surgeon: Raford Riggs, MD;  Location: [facility] INVASIVE CV LAB;  Service: Cardiovascular;  Laterality: N/A;   Patient Active Problem List   Diagnosis Date Noted   PAF (paroxysmal atrial fibrillation) (HCC) 03/22/2023   Obesity, class 3 03/13/2023   Hypothyroid 03/13/2023   Chronic heart failure with preserved ejection fraction (HFpEF) (HCC) 03/13/2023   Chronic HFrEF (heart failure with reduced ejection fraction) (HCC) 03/13/2023   Acute respiratory failure with hypoxia (HCC) 03/10/2023   Acute hypoxemic  respiratory failure (HCC) 03/10/2023   Hypokalemia 03/09/2023   Atrial fibrillation with RVR (HCC) 03/08/2023   Acute on chronic systolic CHF (congestive heart failure) (HCC) 03/08/2023   Iatrogenic hyperthyroidism 03/08/2023   Urinary incontinence, mixed 01/07/2023   Vaginal erosion secondary to pessary use (HCC) 01/07/2023   Female genital prolapse 01/07/2023   Vaginal atrophy 01/07/2023   History of postmenopausal bleeding 01/07/2023   Nocturia 01/07/2023   Constipation 01/07/2023   Class 3 severe obesity with serious comorbidity and body mass index (BMI) of 45.0 to 49.9 in adult 11/27/2021   Vitamin D  deficiency 06/07/2021   Insulin  resistance 06/07/2021   OSA (obstructive sleep apnea) 10/16/2013   Dyslipidemia 10/16/2013   Obesity 10/16/2013   Essential hypertension,  benign 10/16/2013   Fitting and adjustment of pessary 10/16/2013    PCP: Virginia  Cleotilde PA  REFERRING PROVIDER: Catherene Toribio Pac, MD   REFERRING DIAG: M17.0 (ICD-10-CM) - Bilateral primary osteoarthritis of knee   THERAPY DIAG:  Bilateral chronic knee pain  Muscle weakness (generalized)  Difficulty in walking, not elsewhere classified  Rationale for Evaluation and Treatment: Rehabilitation  ONSET DATE: chronic  SUBJECTIVE:   SUBJECTIVE STATEMENT:   Pt arrives in [facility], pushed by husband.  Different kind of soreness after last session but was better by morning pain 4/10   POOL ACCESS: currently none.  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 Left knee> pain than right. 10 years ago left ankle reconstruction  Fell off of ladder ~ 42yrs ago and fx left femur.  Left knee goes out and I fall  PERTINENT HISTORY: Left femur fx 15 yrs ago Ankle arthrotomy 10 yrs ago OA CHF PAIN:  Are you having pain? Yes: NPRS scale: current 4/10 Pain location: bil knee Pain description: ache Aggravating factors: standing 4 minutes; walk <5 min Relieving factors: no OTC drugs due to bleeding risks, ice, resting, sitting  PRECAUTIONS: None  RED FLAGS: None   WEIGHT BEARING RESTRICTIONS: No  FALLS:  Has patient fallen in last 6 months? Yes. Number of falls 1  LIVING ENVIRONMENT: Lives with: lives with their family Lives in: House/apartment Stairs: Yes: Internal: 1 flight not used much to garage 5 steps  steps; on right going up Has following equipment at home: Single point cane, Wheelchair (manual), and transport chair  OCCUPATION: retired  PLOF: Ind with equipment  PATIENT GOALS: decrease pain,maintain mobility  NEXT MD VISIT: next month  OBJECTIVE:  Note: Objective measures were completed at Evaluation unless otherwise noted.  DIAGNOSTIC FINDINGS: OA bilat knees. L>R  PATIENT SURVEYS:  LEFS   Extreme difficulty/unable (0), Quite a bit of difficulty (1), Moderate difficulty (2), Little difficulty (3), No difficulty (4) Survey date:    Any of your usual work, housework or school activities 2  2. Usual hobbies, recreational or sporting activities 3  3. Getting into/out of the bath 2  4. Walking between rooms 2  5. Putting on socks/shoes 3  6. Squatting  0  7. Lifting an object, like a bag of groceries from the floor 3  8. Performing light activities around your home 3  9. Performing heavy activities around your home 1  10. Getting into/out of a car 3  11. Walking 2 blocks 0  12. Walking 1 mile 0  13. Going up/down 10 stairs (1 flight) 1  14. Standing for 1 hour 0  15.  sitting for 1 hour 4  16. Running on  even ground 0  17. Running on uneven ground 0  18. Making sharp turns while running fast 0  19. Hopping  1  20. Rolling over in bed 2  Score total:  30/80     COGNITION: Overall cognitive status: Within functional limits for tasks assessed     SENSATION: WFL   MUSCLE LENGTH: Hamstrings: tested in sitting slightly tight   POSTURE: rounded shoulders and left knee varus deformity  PALPATION: Minor TTP along joint lines bilateral knees  LOWER EXTREMITY ROM:  Active ROM Right eval Left eval  Hip flexion    Hip extension    Hip abduction    Hip adduction    Hip internal rotation    Hip external rotation    Knee flexion 110d 100d  Knee extension 0 0  Ankle dorsiflexion    Ankle plantarflexion    Ankle inversion    Ankle eversion     (Blank rows = not tested)  LOWER EXTREMITY MMT:  MMT Right eval Left eval  Hip flexion 32.9 43.3  Hip extension    Hip abduction    Hip adduction    Hip internal rotation    Hip external rotation    Knee flexion    Knee extension 40.2 48.5  Ankle dorsiflexion    Ankle plantarflexion    Ankle inversion    Ankle eversion     (Blank rows = not tested)    FUNCTIONAL TESTS:  Timed up and go (TUG): uable to  tolerate without canes  STS from armed wc: heavy ue support Initial balance with cga  Balance FT: cl supervision  GAIT: Distance walked: 20 ft without ad Assistive device utilized: 2 canes (did not bring with her today) Level of assistance: CGA Comments: Antalgic gait:Offloading R and L, minimal knee flex during swing, hip circumduction to advance le                                                                                                                                TREATMENT  [facility] Adult PT Treatment:                                             Date: 09/18/23 Pt seen for aquatic therapy today.  Treatment took place in water 3.5-4.75 ft in depth at the [facility] pool. Temp of water was 91.  Pt entered the pool via stairs in side-step motion leading with RLE with BUE on rail, exited the pool via lift chair.  - Unsupported:  forward/backward and side stepping 3.7 ft - ue support barbells standing 3.8 ft: toe raises; heel raises (left ankle fused); HS curls; hip add/abd; hip extension.   (No burning in knees with exercise today) -4.3 in: tandem stance leading L then right ue support YHB x 20s   --SLS  as above x 20s each.  -Noodle pull down yellow wide stance then staggered stances x 10,  3.8 ft -cycling on noodle across pool ue support HB -Straddling noodle ue support corner wall: hip add/abd;  hip flex/extension (good quad stretch) -HS and gastroc stretching at step 20s hold x 3 ea LE   Pt requires the buoyancy and hydrostatic pressure of water for support, and to offload joints by unweighting joint load by at least 50 % in navel deep water and by at least 75-80% in chest to neck deep water.  Viscosity of the water is needed for resistance of strengthening. Water current perturbations provides challenge to standing balance requiring increased core activation.       PATIENT EDUCATION:  Education details: intro to aquatic therapy  Person educated: Patient and  Spouse Education method: Explanation Education comprehension: verbalized understanding  HOME EXERCISE PROGRAM: TBA  ASSESSMENT:  CLINICAL IMPRESSION: Pt negotiates stairs without difficulty forward facing.  Continues to use ue for support and step to pattern. Improved balance and reduction in pain as demonstrated through amb submerged unsupported.  Left knee continues to be more painful than right. She requires multiple rest periods throughout session for left knee discomfort. Pt considering joining [facility] for pool access.       Initial impression:  Patient is a 78 y.o. f who was seen today for physical therapy evaluation and treatment for oa bilat knees. She presents with husband who assisted her in [facility] to setting.  Pt reports pain in bilateral knees L>R and is wanting TKR but due to a cardiac illness earlier this year is not a candidate for another 11 months.  Her pain sensitivity is high.  She is unable to take pain meds nor anti-inflammatories due to cardiac issue. Exam demonstrates fairly good strength in quads.  Her fall risk is high as demonstrated through difficulty with immediate standing balance, requiring assistance to gain FT position and heavy ue assistance with all transitional movements..  She reports use of bilateral cane with amb in home if not furniture surfing. She did not bring her canes and is unable to tolerate completing other functional tests. Pt has had  recent fall due to left knee giving out.  She is a good candidate for skilled PT both land and aquatic based.  Plan to use the properties of water initially to initiate movement and encourage muscle length, improve stability/balance and manage pain then transition onto land for increased loading to build strength  OBJECTIVE IMPAIRMENTS: Abnormal gait, decreased activity tolerance, decreased balance, decreased mobility, difficulty walking, decreased ROM, decreased strength, postural dysfunction, obesity, and pain.    ACTIVITY LIMITATIONS: carrying, lifting, bending, sitting, standing, squatting, sleeping, stairs, transfers, and locomotion level  PARTICIPATION LIMITATIONS: meal prep, cleaning, driving, shopping, community activity, and yard work  PERSONAL FACTORS: Time since onset of injury/illness/exacerbation and 1-2 comorbidities: see Pmhx are also affecting patient's functional outcome.   REHAB POTENTIAL: Good  CLINICAL DECISION MAKING: Evolving/moderate complexity  EVALUATION COMPLEXITY: Moderate   GOALS: Goals reviewed with patient? Yes  SHORT TERM GOALS: Target date: 09/13/23  Pt will tolerate full aquatic sessions consistently without increase in pain and with improving function to demonstrate good toleration and effectiveness of intervention.  Baseline: Goal status: Met 09/11/23  2.  Pt will negotiate steps entering and exiting pool Baseline:  Goal status: In progress 09/11/23  3.  Pt will have information and make decisions on accessing pool in future for management of condtion Baseline:  Goal status: In  progress 09/11/23; Met 09/19/23    LONG TERM GOALS: Target date: 10/11/23  Pt to improve on LEFS by at least 9 point to demonstrate statistically significant Improvement in function. Baseline:30/80  Goal status: INITIAL  2.  Pt will be indep with final HEP's (land and aquatic as appropriate) for continued management of condition Baseline:  Goal status: INITIAL  3.  Pt will tolerate walking 10 consecutive minutes to demonstrate improved toleration to amb Baseline:  Goal status: INITIAL  4.  Pt will tolerate walking to or from setting (500 ft)  using approp AD and engaging in aquatic therapy session without excessive fatigue or increase in pain to demonstrate improved toleration to activity.  Baseline:  Goal status: INITIAL  5.  Pt will report decrease in pain by at least 50% for improved toleration to activity/quality of life and to demonstrate improved management of  pain. Baseline:  Goal status: INITIAL  6.  Pt will report improvement in functional mobility and ADL's at home Baseline: wants to at least maintain Goal status: INITIAL   PLAN:  PT FREQUENCY: 2x/week  PT DURATION: 8 weeks  PLANNED INTERVENTIONS: 97164- PT Re-evaluation, 97750- Physical Performance Testing, 97110-Therapeutic exercises, 97530- Therapeutic activity, 97112- Neuromuscular re-education, 97535- Self Care, [id number]- Manual therapy, [phone]- Gait training, [phone]- Orthotic Initial, [phone]- Aquatic Therapy, [phone]- Electrical stimulation (unattended), [phone]- Ionotophoresis 4mg /ml Dexamethasone , 79439 (1-2 muscles), 20561 (3+ muscles)- Dry Needling, Patient/Family education, Balance training, Stair training, Taping, Joint mobilization, DME instructions, Cryotherapy, and Moist heat  Approved codes (insurance auth): therapeutic exercise, neuro -re ed; gait training; manual therapy; therapeutic activity; self care; performance testing; aquatic therapy; mechanical traction; US ; orthotic fit; vaso; PT re-eval.   PLAN FOR NEXT SESSION: aquatic for first 3-4 weeks tthe alternate with land  Ronal Kem) Ramelo Oetken MPT 09/18/23 2:12 PM [facility] [address] [place], [place], [address] Phone: [phone]   Fax:  [phone]     Date of referral: 06/04/23 Referring provider:   Catherene Toribio Pac, MD   Referring diagnosis? M17.0 (ICD-10-CM) - Bilateral primary osteoarthritis of knee  Treatment diagnosis? (if different than referring diagnosis) no  What was this (referring dx) caused by? Arthritis  Nature of Condition: Chronic (continuous duration > 3 months)   Laterality: Both  Current Functional Measure Score: LEFS 30/80  Objective measurements identify impairments when they are compared to normal values, the uninvolved extremity, and prior level of function.  [x]  Yes  []  No  Objective assessment of functional ability:  Moderate functional limitations   Briefly describe symptoms: chronic knee pain due to OA  How did symptoms start: chronic pain  Average pain intensity:  Last 24 hours: 4/10  Past week: 4/10  How often does the pt experience symptoms? Constantly  How much have the symptoms interfered with usual daily activities? Quite a bit  How has condition changed since care began at this facility? NA - initial visit  In general, how is the patients overall health? Good   BACK PAIN (STarT Back Screening Tool) No

## 2023-09-20 ENCOUNTER — Ambulatory Visit (HOSPITAL_BASED_OUTPATIENT_CLINIC_OR_DEPARTMENT_OTHER): Attending: Pain Medicine

## 2023-09-20 ENCOUNTER — Encounter (HOSPITAL_BASED_OUTPATIENT_CLINIC_OR_DEPARTMENT_OTHER): Payer: Self-pay

## 2023-09-20 DIAGNOSIS — R2681 Unsteadiness on feet: Secondary | ICD-10-CM | POA: Diagnosis not present

## 2023-09-20 DIAGNOSIS — G8929 Other chronic pain: Secondary | ICD-10-CM | POA: Diagnosis not present

## 2023-09-20 DIAGNOSIS — M25562 Pain in left knee: Secondary | ICD-10-CM | POA: Diagnosis not present

## 2023-09-20 DIAGNOSIS — R262 Difficulty in walking, not elsewhere classified: Secondary | ICD-10-CM | POA: Diagnosis not present

## 2023-09-20 DIAGNOSIS — M6281 Muscle weakness (generalized): Secondary | ICD-10-CM | POA: Diagnosis not present

## 2023-09-20 DIAGNOSIS — M25561 Pain in right knee: Secondary | ICD-10-CM | POA: Insufficient documentation

## 2023-09-20 NOTE — Therapy (Signed)
 OUTPATIENT PHYSICAL THERAPY LOWER EXTREMITY TREATMENT   Patient Name: Cassandra Ho MRN: [id number] DOB:1945-06-24, 78 y.o., female Today's Date: 09/20/2023  END OF SESSION:  PT End of Session - 09/20/23 1137     Visit Number 6    Number of Visits 16    Date for PT Re-Evaluation 10/11/23    Authorization Type [employer] mcr    Authorization Time Period 08/15/23-8/21/225    Authorization - Visit Number 6    Authorization - Number of Visits 12    Progress Note Due on Visit 10    PT Start Time 1105    PT Stop Time 1145    PT Time Calculation (min) 40 min    Activity Tolerance Patient tolerated treatment well    Behavior During Therapy WFL for tasks assessed/performed           Past Medical History:  Diagnosis Date   Allergic rhinitis    Arthritis    generalized-ankles and knees   BCC (basal cell carcinoma of skin)    Constipation    Dyslipidemia    Edema of both lower extremities    GERD (gastroesophageal reflux disease)    H/O: pituitary tumor    irradicated no problems now   Headache    migraines in past   High cholesterol    Hypertension    Hypothyroidism    Hypothyroidism (acquired)    Post-ablation   Joint pain    Morbid obesity (HCC)    OSA (obstructive sleep apnea)    intolerant to CPAP and now using an oral device   Osteoarthritis    Toxic solitary thyroid  nodule    radioactive iodine therapy with 31.4 mCi of I-131 on 12/05/2007   Transfusion history    with hip surgery   Past Surgical History:  Procedure Laterality Date   ANKLE ARTHROTOMY Left    ankle and foot reconstruction ;retained hardware   BREAST BIOPSY Bilateral    Many years ago, no scar seen    BREAST SURGERY     x2 biopsies(benign)   CARDIOVERSION N/A 03/14/2023   Procedure: CARDIOVERSION;  Surgeon: Raford Riggs, MD;  Location: [facility] INVASIVE CV LAB;  Service: Cardiovascular;  Laterality: N/A;   CARPAL TUNNEL RELEASE Bilateral    COLONOSCOPY WITH PROPOFOL  N/A 06/15/2014    Procedure: COLONOSCOPY WITH PROPOFOL ;  Surgeon: Gladis MARLA Louder, MD;  Location: [facility];  Service: Endoscopy;  Laterality: N/A;   D&C  1977   DILATION AND CURETTAGE OF UTERUS  1973   SAB   DILATION AND CURETTAGE OF UTERUS  1974   DILATION AND CURETTAGE OF UTERUS  1975   DILATION AND CURETTAGE OF UTERUS  1978   DILATION AND CURETTAGE OF UTERUS  1991   HIP FRACTURE SURGERY     repaired with cader bone and metal plates   IUD REMOVAL  2000   hysteroscopy   KNEE ARTHROSCOPY Right    torn meniscus     TRANSESOPHAGEAL ECHOCARDIOGRAM (CATH LAB) N/A 03/14/2023   Procedure: TRANSESOPHAGEAL ECHOCARDIOGRAM;  Surgeon: Raford Riggs, MD;  Location: [facility] INVASIVE CV LAB;  Service: Cardiovascular;  Laterality: N/A;   Patient Active Problem List   Diagnosis Date Noted   PAF (paroxysmal atrial fibrillation) (HCC) 03/22/2023   Obesity, class 3 03/13/2023   Hypothyroid 03/13/2023   Chronic heart failure with preserved ejection fraction (HFpEF) (HCC) 03/13/2023   Chronic HFrEF (heart failure with reduced ejection fraction) (HCC) 03/13/2023   Acute respiratory failure with hypoxia (HCC) 03/10/2023   Acute  hypoxemic respiratory failure (HCC) 03/10/2023   Hypokalemia 03/09/2023   Atrial fibrillation with RVR (HCC) 03/08/2023   Acute on chronic systolic CHF (congestive heart failure) (HCC) 03/08/2023   Iatrogenic hyperthyroidism 03/08/2023   Urinary incontinence, mixed 01/07/2023   Vaginal erosion secondary to pessary use (HCC) 01/07/2023   Female genital prolapse 01/07/2023   Vaginal atrophy 01/07/2023   History of postmenopausal bleeding 01/07/2023   Nocturia 01/07/2023   Constipation 01/07/2023   Class 3 severe obesity with serious comorbidity and body mass index (BMI) of 45.0 to 49.9 in adult 11/27/2021   Vitamin D  deficiency 06/07/2021   Insulin  resistance 06/07/2021   OSA (obstructive sleep apnea) 10/16/2013   Dyslipidemia 10/16/2013   Obesity 10/16/2013   Essential hypertension,  benign 10/16/2013   Fitting and adjustment of pessary 10/16/2013    PCP: Virginia  Cleotilde PA  REFERRING PROVIDER: Catherene Toribio Pac, MD   REFERRING DIAG: M17.0 (ICD-10-CM) - Bilateral primary osteoarthritis of knee   THERAPY DIAG:  Bilateral chronic knee pain  Muscle weakness (generalized)  Difficulty in walking, not elsewhere classified  Unsteadiness on feet  Rationale for Evaluation and Treatment: Rehabilitation  ONSET DATE: chronic  SUBJECTIVE:   SUBJECTIVE STATEMENT:   Pt arrives in [facility], pushed by husband.  Pt reports pool therapy has bene going well. L knee buckles and R knee pops and causes her to lose her balance.    POOL ACCESS: currently none.  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 Left knee> pain than right. 10 years ago left ankle reconstruction  Fell off of ladder ~ 17yrs ago and fx left femur.  Left knee goes out and I fall  PERTINENT HISTORY: Left femur fx 15 yrs ago Ankle arthrotomy 10 yrs ago OA CHF PAIN:  Are you having pain? Yes: NPRS scale: current 4/10 Pain location: bil knee Pain description: ache Aggravating factors: standing 4 minutes; walk <5 min Relieving factors: no OTC drugs due to bleeding risks, ice, resting, sitting  PRECAUTIONS: None  RED FLAGS: None   WEIGHT BEARING RESTRICTIONS: No  FALLS:  Has patient fallen in last 6 months? Yes. Number of falls 1  LIVING ENVIRONMENT: Lives with: lives with their family Lives in: House/apartment Stairs: Yes: Internal: 1 flight not used much to garage 5 steps  steps; on right going up Has following equipment at home: Single point cane, Wheelchair (manual), and transport chair  OCCUPATION: retired  PLOF: Ind with equipment  PATIENT GOALS: decrease pain,maintain mobility  NEXT MD VISIT: next month  OBJECTIVE:  Note: Objective measures were completed at Evaluation unless otherwise noted.  DIAGNOSTIC FINDINGS:  OA bilat knees. L>R  PATIENT SURVEYS:  LEFS  Extreme difficulty/unable (0), Quite a bit of difficulty (1), Moderate difficulty (2), Little difficulty (3), No difficulty (4) Survey date:    Any of your usual work, housework or school activities 2  2. Usual hobbies, recreational or sporting activities 3  3. Getting into/out of the bath 2  4. Walking between rooms 2  5. Putting on socks/shoes 3  6. Squatting  0  7. Lifting an object, like a bag of groceries from the floor 3  8. Performing light activities around your home 3  9. Performing heavy activities around your home 1  10. Getting into/out of a car 3  11. Walking 2 blocks 0  12. Walking 1 mile 0  13. Going up/down 10 stairs (1 flight) 1  14. Standing for 1  hour 0  15.  sitting for 1 hour 4  16. Running on even ground 0  17. Running on uneven ground 0  18. Making sharp turns while running fast 0  19. Hopping  1  20. Rolling over in bed 2  Score total:  30/80     COGNITION: Overall cognitive status: Within functional limits for tasks assessed     SENSATION: WFL   MUSCLE LENGTH: Hamstrings: tested in sitting slightly tight   POSTURE: rounded shoulders and left knee varus deformity  PALPATION: Minor TTP along joint lines bilateral knees  LOWER EXTREMITY ROM:  Active ROM Right eval Left eval  Hip flexion    Hip extension    Hip abduction    Hip adduction    Hip internal rotation    Hip external rotation    Knee flexion 110d 100d  Knee extension 0 0  Ankle dorsiflexion    Ankle plantarflexion    Ankle inversion    Ankle eversion     (Blank rows = not tested)  LOWER EXTREMITY MMT:  MMT Right eval Left eval  Hip flexion 32.9 43.3  Hip extension    Hip abduction    Hip adduction    Hip internal rotation    Hip external rotation    Knee flexion    Knee extension 40.2 48.5  Ankle dorsiflexion    Ankle plantarflexion    Ankle inversion    Ankle eversion     (Blank rows = not  tested)    FUNCTIONAL TESTS:  Timed up and go (TUG): uable to tolerate without canes  STS from armed wc: heavy ue support Initial balance with cga  Balance FT: cl supervision  GAIT: Distance walked: 20 ft without ad Assistive device utilized: 2 canes (did not bring with her today) Level of assistance: CGA Comments: Antalgic gait:Offloading R and L, minimal knee flex during swing, hip circumduction to advance le                                                                                                                                TREATMENT  [facility] Adult PT Treatment:                                               09/20/23 -gait with FWW x14ft -Roller to L quad -SAQ bilateral- alternating x77min -PROM bil knees (minimal) -Partial bridge with focus on glute activation-LES on red physioball x6 -DKTC with red physioball x1.31min -Seated marching 2x10 -seated HR/TR 2x10 -seated LAQ x5 no weight, x10ea 3# -seated adductor squeeze 5 2x10 -Nu step x64min L2 (seat 10)  Date: 09/18/23 Pt seen for aquatic therapy today.  Treatment took place in water 3.5-4.75 ft in depth at the [facility] pool. Temp of water was 91.  Pt entered the  pool via stairs in side-step motion leading with RLE with BUE on rail, exited the pool via lift chair.  - Unsupported:  forward/backward and side stepping 3.7 ft - ue support barbells standing 3.8 ft: toe raises; heel raises (left ankle fused); HS curls; hip add/abd; hip extension.   (No burning in knees with exercise today) -4.3 in: tandem stance leading L then right ue support YHB x 20s   --SLS as above x 20s each.  -Noodle pull down yellow wide stance then staggered stances x 10,  3.8 ft -cycling on noodle across pool ue support HB -Straddling noodle ue support corner wall: hip add/abd;  hip flex/extension (good quad stretch) -HS and gastroc stretching at step 20s hold x 3 ea LE   Pt requires the buoyancy and hydrostatic pressure of water  for support, and to offload joints by unweighting joint load by at least 50 % in navel deep water and by at least 75-80% in chest to neck deep water.  Viscosity of the water is needed for resistance of strengthening. Water current perturbations provides challenge to standing balance requiring increased core activation.       PATIENT EDUCATION:  Education details: intro to aquatic therapy  Person educated: Patient and Spouse Education method: Explanation Education comprehension: verbalized understanding  HOME EXERCISE PROGRAM: TBA  ASSESSMENT:  CLINICAL IMPRESSION:  Good tolerance for supine exercises. Greater quad tightness on L LE felt so used roller tool on this side with good response. She did have mild L knee discomfort with SAQ. With bridges, cuing was provided for glute and core engagement as well as breathing techniques. Trialed nu-step without complaint of pain, citing positive response to this. Trialed use of FWW as pt reports having poor experience with rollator in the past. Pt reported feeling more steady with this and may borrow a friend's to see how she likes it for at home. Educated pt on DOMS and instructed to use ice to decrease pain. Will plan to prescribe land HEP at next visit based on pt response to initial session.   Pt negotiates stairs without difficulty forward facing.  Continues to use ue for support and step to pattern. Improved balance and reduction in pain as demonstrated through amb submerged unsupported.  Left knee continues to be more painful than right. She requires multiple rest periods throughout session for left knee discomfort. Pt considering joining [facility] for pool access.       Initial impression:  Patient is a 78 y.o. f who was seen today for physical therapy evaluation and treatment for oa bilat knees. She presents with husband who assisted her in [facility] to setting.  Pt reports pain in bilateral knees L>R and is wanting TKR but due to a cardiac  illness earlier this year is not a candidate for another 11 months.  Her pain sensitivity is high.  She is unable to take pain meds nor anti-inflammatories due to cardiac issue. Exam demonstrates fairly good strength in quads.  Her fall risk is high as demonstrated through difficulty with immediate standing balance, requiring assistance to gain FT position and heavy ue assistance with all transitional movements..  She reports use of bilateral cane with amb in home if not furniture surfing. She did not bring her canes and is unable to tolerate completing other functional tests. Pt has had  recent fall due to left knee giving out.  She is a good candidate for skilled PT both land and aquatic based.  Plan to use the properties  of water initially to initiate movement and encourage muscle length, improve stability/balance and manage pain then transition onto land for increased loading to build strength  OBJECTIVE IMPAIRMENTS: Abnormal gait, decreased activity tolerance, decreased balance, decreased mobility, difficulty walking, decreased ROM, decreased strength, postural dysfunction, obesity, and pain.   ACTIVITY LIMITATIONS: carrying, lifting, bending, sitting, standing, squatting, sleeping, stairs, transfers, and locomotion level  PARTICIPATION LIMITATIONS: meal prep, cleaning, driving, shopping, community activity, and yard work  PERSONAL FACTORS: Time since onset of injury/illness/exacerbation and 1-2 comorbidities: see Pmhx are also affecting patient's functional outcome.   REHAB POTENTIAL: Good  CLINICAL DECISION MAKING: Evolving/moderate complexity  EVALUATION COMPLEXITY: Moderate   GOALS: Goals reviewed with patient? Yes  SHORT TERM GOALS: Target date: 09/13/23  Pt will tolerate full aquatic sessions consistently without increase in pain and with improving function to demonstrate good toleration and effectiveness of intervention.  Baseline: Goal status: Met 09/11/23  2.  Pt will  negotiate steps entering and exiting pool Baseline:  Goal status: In progress 09/11/23  3.  Pt will have information and make decisions on accessing pool in future for management of condtion Baseline:  Goal status: In progress 09/11/23; Met 09/19/23    LONG TERM GOALS: Target date: 10/11/23  Pt to improve on LEFS by at least 9 point to demonstrate statistically significant Improvement in function. Baseline:30/80  Goal status: INITIAL  2.  Pt will be indep with final HEP's (land and aquatic as appropriate) for continued management of condition Baseline:  Goal status: INITIAL  3.  Pt will tolerate walking 10 consecutive minutes to demonstrate improved toleration to amb Baseline:  Goal status: INITIAL  4.  Pt will tolerate walking to or from setting (500 ft)  using approp AD and engaging in aquatic therapy session without excessive fatigue or increase in pain to demonstrate improved toleration to activity.  Baseline:  Goal status: INITIAL  5.  Pt will report decrease in pain by at least 50% for improved toleration to activity/quality of life and to demonstrate improved management of pain. Baseline:  Goal status: INITIAL  6.  Pt will report improvement in functional mobility and ADL's at home Baseline: wants to at least maintain Goal status: INITIAL   PLAN:  PT FREQUENCY: 2x/week  PT DURATION: 8 weeks  PLANNED INTERVENTIONS: 97164- PT Re-evaluation, 97750- Physical Performance Testing, 97110-Therapeutic exercises, 97530- Therapeutic activity, 97112- Neuromuscular re-education, 97535- Self Care, 02859- Manual therapy, [phone]- Gait training, [phone]- Orthotic Initial, [phone]- Aquatic Therapy, [phone]- Electrical stimulation (unattended), [phone]- Ionotophoresis 4mg /ml Dexamethasone , 79439 (1-2 muscles), 20561 (3+ muscles)- Dry Needling, Patient/Family education, Balance training, Stair training, Taping, Joint mobilization, DME instructions, Cryotherapy, and Moist heat  Approved codes  (insurance auth): therapeutic exercise, neuro -re ed; gait training; manual therapy; therapeutic activity; self care; performance testing; aquatic therapy; mechanical traction; US ; orthotic fit; vaso; PT re-eval.   PLAN FOR NEXT SESSION: aquatic for first 3-4 weeks tthe alternate with land  Asberry Rodes, PTA  09/20/23 1:31 PM [facility] [address] [place], [place], [address] Phone: [phone]   Fax:  [phone]     Date of referral: 06/04/23 Referring provider:   Catherene Toribio Pac, MD   Referring diagnosis? M17.0 (ICD-10-CM) - Bilateral primary osteoarthritis of knee  Treatment diagnosis? (if different than referring diagnosis) no  What was this (referring dx) caused by? Arthritis  Nature of Condition: Chronic (continuous duration > 3 months)   Laterality: Both  Current Functional Measure Score: LEFS 30/80  Objective measurements identify impairments  when they are compared to normal values, the uninvolved extremity, and prior level of function.  [x]  Yes  []  No  Objective assessment of functional ability: Moderate functional limitations   Briefly describe symptoms: chronic knee pain due to OA  How did symptoms start: chronic pain  Average pain intensity:  Last 24 hours: 4/10  Past week: 4/10  How often does the pt experience symptoms? Constantly  How much have the symptoms interfered with usual daily activities? Quite a bit  How has condition changed since care began at this facility? NA - initial visit  In general, how is the patients overall health? Good   BACK PAIN (STarT Back Screening Tool) No

## 2023-09-23 ENCOUNTER — Ambulatory Visit: Admitting: Obstetrics and Gynecology

## 2023-09-23 ENCOUNTER — Encounter: Payer: Self-pay | Admitting: Obstetrics and Gynecology

## 2023-09-23 VITALS — BP 91/57 | HR 67

## 2023-09-23 DIAGNOSIS — N3946 Mixed incontinence: Secondary | ICD-10-CM

## 2023-09-23 DIAGNOSIS — M170 Bilateral primary osteoarthritis of knee: Secondary | ICD-10-CM | POA: Diagnosis not present

## 2023-09-23 DIAGNOSIS — N393 Stress incontinence (female) (male): Secondary | ICD-10-CM | POA: Diagnosis not present

## 2023-09-23 DIAGNOSIS — M25562 Pain in left knee: Secondary | ICD-10-CM | POA: Diagnosis not present

## 2023-09-23 DIAGNOSIS — N819 Female genital prolapse, unspecified: Secondary | ICD-10-CM | POA: Diagnosis not present

## 2023-09-23 DIAGNOSIS — G8929 Other chronic pain: Secondary | ICD-10-CM | POA: Diagnosis not present

## 2023-09-23 DIAGNOSIS — M25561 Pain in right knee: Secondary | ICD-10-CM | POA: Diagnosis not present

## 2023-09-23 NOTE — Progress Notes (Signed)
 [facility]   Subjective:     Chief Complaint:  Chief Complaint  Patient presents with   pessary refitting    History of Present Illness: Cassandra Ho is a 78 y.o. female with stage I pelvic organ prolapse and stress incontinence who presents for a pessary check. She is using a size #0 cube pessary. The pessary has not been supporting her as well and she reports having more leakage. . She is using vaginal estrogen. She denies vaginal bleeding.  Past Medical History: Patient  has a past medical history of Allergic rhinitis, Arthritis, BCC (basal cell carcinoma of skin), Constipation, Dyslipidemia, Edema of both lower extremities, GERD (gastroesophageal reflux disease), H/O: pituitary tumor, Headache, High cholesterol, Hypertension, Hypothyroidism, Hypothyroidism (acquired), Joint pain, Morbid obesity (HCC), OSA (obstructive sleep apnea), Osteoarthritis, Toxic solitary thyroid  nodule, and Transfusion history.   Past Surgical History: She  has a past surgical history that includes Carpal tunnel release (Bilateral); Knee arthroscopy (Right); Breast surgery; Ankle Arthrotomy (Left); Hip fracture surgery; Colonoscopy with propofol  (N/A, 06/15/2014); Breast biopsy (Bilateral); torn meniscus; Dilation and curettage of uterus (1973); Dilation and curettage of uterus (1974); Dilation and curettage of uterus (1975); D&C (1977); Dilation and curettage of uterus (1978); Dilation and curettage of uterus (1991); IUD removal (2000); TRANSESOPHAGEAL ECHOCARDIOGRAM (N/A, 03/14/2023); and CARDIOVERSION (N/A, 03/14/2023).   Medications: She has a current medication list which includes the following prescription(s): acetaminophen , apixaban , celecoxib, conjugated estrogens , duloxetine, empagliflozin , famotidine , furosemide , levothyroxine , loratadine , metoprolol  succinate, psyllium, rosuvastatin , entresto , spironolactone , and trazodone.   Allergies: Patient has no known allergies.   Social  History: Patient  reports that she has quit smoking. Her smoking use included cigarettes. She has never used smokeless tobacco. She reports current alcohol use. She reports that she does not use drugs.      Objective:    Physical Exam: BP (!) 91/57 (BP Location: Right Arm, Patient Position: Sitting, Cuff Size: Large)   Pulse 67  Gen: No apparent distress, A&O x 3. Detailed Urogynecologic Evaluation:  Pelvic Exam: Normal external female genitalia; Bartholin's and Skene's glands normal in appearance; urethral meatus normal in appearance, no urethral masses or discharge. The pessary was noted to be in place. It was removed and cleaned. Speculum exam revealed no lesions in the vagina. The pessary was replaced with a #2 cube. Patient reported it was comfortable and seemed to fit well. She was able to void without pessary expulsion.    Assessment/Plan:    Assessment: Cassandra Ho is a 78 y.o. with stage I pelvic organ prolapse and stress incontinence here for a pessary check. She is doing well.  Plan: She will keep the pessary in place until next visit. She will continue to use estrogen. She will follow-up in 2 months for a pessary check or sooner as needed.  All questions were answered.

## 2023-09-25 ENCOUNTER — Ambulatory Visit (HOSPITAL_BASED_OUTPATIENT_CLINIC_OR_DEPARTMENT_OTHER)

## 2023-09-25 ENCOUNTER — Encounter (HOSPITAL_BASED_OUTPATIENT_CLINIC_OR_DEPARTMENT_OTHER): Payer: Self-pay

## 2023-09-25 DIAGNOSIS — G8929 Other chronic pain: Secondary | ICD-10-CM | POA: Diagnosis not present

## 2023-09-25 DIAGNOSIS — M25561 Pain in right knee: Secondary | ICD-10-CM | POA: Diagnosis not present

## 2023-09-25 DIAGNOSIS — M6281 Muscle weakness (generalized): Secondary | ICD-10-CM | POA: Diagnosis not present

## 2023-09-25 DIAGNOSIS — R2681 Unsteadiness on feet: Secondary | ICD-10-CM | POA: Diagnosis not present

## 2023-09-25 DIAGNOSIS — R262 Difficulty in walking, not elsewhere classified: Secondary | ICD-10-CM | POA: Diagnosis not present

## 2023-09-25 DIAGNOSIS — M25562 Pain in left knee: Secondary | ICD-10-CM | POA: Diagnosis not present

## 2023-09-25 NOTE — Therapy (Signed)
 OUTPATIENT PHYSICAL THERAPY LOWER EXTREMITY TREATMENT   Patient Name: Cassandra Ho MRN: [id number] DOB:04/27/1945, 78 y.o., female Today's Date: 09/25/2023  END OF SESSION:  PT End of Session - 09/25/23 1009     Visit Number 7    Number of Visits 16    Date for PT Re-Evaluation 10/11/23    Authorization Type [employer] mcr    Authorization Time Period 08/15/23-8/21/225    Authorization - Visit Number 7    Authorization - Number of Visits 12    Progress Note Due on Visit 10    PT Start Time 1020    PT Stop Time 1100    PT Time Calculation (min) 40 min    Activity Tolerance Patient tolerated treatment well    Behavior During Therapy WFL for tasks assessed/performed            Past Medical History:  Diagnosis Date   Allergic rhinitis    Arthritis    generalized-ankles and knees   BCC (basal cell carcinoma of skin)    Constipation    Dyslipidemia    Edema of both lower extremities    GERD (gastroesophageal reflux disease)    H/O: pituitary tumor    irradicated no problems now   Headache    migraines in past   High cholesterol    Hypertension    Hypothyroidism    Hypothyroidism (acquired)    Post-ablation   Joint pain    Morbid obesity (HCC)    OSA (obstructive sleep apnea)    intolerant to CPAP and now using an oral device   Osteoarthritis    Toxic solitary thyroid  nodule    radioactive iodine therapy with 31.4 mCi of I-131 on 12/05/2007   Transfusion history    with hip surgery   Past Surgical History:  Procedure Laterality Date   ANKLE ARTHROTOMY Left    ankle and foot reconstruction ;retained hardware   BREAST BIOPSY Bilateral    Many years ago, no scar seen    BREAST SURGERY     x2 biopsies(benign)   CARDIOVERSION N/A 03/14/2023   Procedure: CARDIOVERSION;  Surgeon: Raford Riggs, MD;  Location: [facility] INVASIVE CV LAB;  Service: Cardiovascular;  Laterality: N/A;   CARPAL TUNNEL RELEASE Bilateral    COLONOSCOPY WITH PROPOFOL  N/A 06/15/2014    Procedure: COLONOSCOPY WITH PROPOFOL ;  Surgeon: Gladis MARLA Louder, MD;  Location: [facility];  Service: Endoscopy;  Laterality: N/A;   D&C  1977   DILATION AND CURETTAGE OF UTERUS  1973   SAB   DILATION AND CURETTAGE OF UTERUS  1974   DILATION AND CURETTAGE OF UTERUS  1975   DILATION AND CURETTAGE OF UTERUS  1978   DILATION AND CURETTAGE OF UTERUS  1991   HIP FRACTURE SURGERY     repaired with cader bone and metal plates   IUD REMOVAL  2000   hysteroscopy   KNEE ARTHROSCOPY Right    torn meniscus     TRANSESOPHAGEAL ECHOCARDIOGRAM (CATH LAB) N/A 03/14/2023   Procedure: TRANSESOPHAGEAL ECHOCARDIOGRAM;  Surgeon: Raford Riggs, MD;  Location: [facility] INVASIVE CV LAB;  Service: Cardiovascular;  Laterality: N/A;   Patient Active Problem List   Diagnosis Date Noted   PAF (paroxysmal atrial fibrillation) (HCC) 03/22/2023   Obesity, class 3 03/13/2023   Hypothyroid 03/13/2023   Chronic heart failure with preserved ejection fraction (HFpEF) (HCC) 03/13/2023   Chronic HFrEF (heart failure with reduced ejection fraction) (HCC) 03/13/2023   Acute respiratory failure with hypoxia (HCC) 03/10/2023  Acute hypoxemic respiratory failure (HCC) 03/10/2023   Hypokalemia 03/09/2023   Atrial fibrillation with RVR (HCC) 03/08/2023   Acute on chronic systolic CHF (congestive heart failure) (HCC) 03/08/2023   Iatrogenic hyperthyroidism 03/08/2023   Urinary incontinence, mixed 01/07/2023   Vaginal erosion secondary to pessary use (HCC) 01/07/2023   Female genital prolapse 01/07/2023   Vaginal atrophy 01/07/2023   History of postmenopausal bleeding 01/07/2023   Nocturia 01/07/2023   Constipation 01/07/2023   Class 3 severe obesity with serious comorbidity and body mass index (BMI) of 45.0 to 49.9 in adult 11/27/2021   Vitamin D  deficiency 06/07/2021   Insulin  resistance 06/07/2021   OSA (obstructive sleep apnea) 10/16/2013   Dyslipidemia 10/16/2013   Obesity 10/16/2013   Essential hypertension,  benign 10/16/2013   Fitting and adjustment of pessary 10/16/2013    PCP: Virginia  Cleotilde PA  REFERRING PROVIDER: Catherene Toribio Pac, MD   REFERRING DIAG: M17.0 (ICD-10-CM) - Bilateral primary osteoarthritis of knee   THERAPY DIAG:  Bilateral chronic knee pain  Muscle weakness (generalized)  Difficulty in walking, not elsewhere classified  Rationale for Evaluation and Treatment: Rehabilitation  ONSET DATE: chronic  SUBJECTIVE:   SUBJECTIVE STATEMENT:   Pt arrives in [facility], pushed by husband and brings bilateral SPCs. She ambulated from lobby to clinic using bil canes.  Pt reports her R hip and her low back was sore after last session, but this went away after using the heating pad. Knees aren't doing too bad today.  I could use the canes when I got out of bed today. Pt reports she purchased a walker and is doing well with this.    POOL ACCESS: currently none.  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 Left knee> pain than right. 10 years ago left ankle reconstruction  Fell off of ladder ~ 79yrs ago and fx left femur.  Left knee goes out and I fall  PERTINENT HISTORY: Left femur fx 15 yrs ago Ankle arthrotomy 10 yrs ago OA CHF PAIN:  Are you having pain? Yes: NPRS scale: current 2/10 Pain location: bil knee Pain description: ache Aggravating factors: standing 4 minutes; walk <5 min Relieving factors: no OTC drugs due to bleeding risks, ice, resting, sitting  PRECAUTIONS: None  RED FLAGS: None   WEIGHT BEARING RESTRICTIONS: No  FALLS:  Has patient fallen in last 6 months? Yes. Number of falls 1  LIVING ENVIRONMENT: Lives with: lives with their family Lives in: House/apartment Stairs: Yes: Internal: 1 flight not used much to garage 5 steps  steps; on right going up Has following equipment at home: Single point cane, Wheelchair (manual), and transport chair  OCCUPATION: retired  PLOF: Ind with  equipment  PATIENT GOALS: decrease pain,maintain mobility  NEXT MD VISIT: next month  OBJECTIVE:  Note: Objective measures were completed at Evaluation unless otherwise noted.  DIAGNOSTIC FINDINGS: OA bilat knees. L>R  PATIENT SURVEYS:  LEFS  Extreme difficulty/unable (0), Quite a bit of difficulty (1), Moderate difficulty (2), Little difficulty (3), No difficulty (4) Survey date:    Any of your usual work, housework or school activities 2  2. Usual hobbies, recreational or sporting activities 3  3. Getting into/out of the bath 2  4. Walking between rooms 2  5. Putting on socks/shoes 3  6. Squatting  0  7. Lifting an object, like a bag of groceries from the floor 3  8. Performing light activities around your home 3  9. Performing heavy activities around your home 1  10. Getting into/out of a car 3  11. Walking 2 blocks 0  12. Walking 1 mile 0  13. Going up/down 10 stairs (1 flight) 1  14. Standing for 1 hour 0  15.  sitting for 1 hour 4  16. Running on even ground 0  17. Running on uneven ground 0  18. Making sharp turns while running fast 0  19. Hopping  1  20. Rolling over in bed 2  Score total:  30/80     COGNITION: Overall cognitive status: Within functional limits for tasks assessed     SENSATION: WFL   MUSCLE LENGTH: Hamstrings: tested in sitting slightly tight   POSTURE: rounded shoulders and left knee varus deformity  PALPATION: Minor TTP along joint lines bilateral knees  LOWER EXTREMITY ROM:  Active ROM Right eval Left eval  Hip flexion    Hip extension    Hip abduction    Hip adduction    Hip internal rotation    Hip external rotation    Knee flexion 110d 100d  Knee extension 0 0  Ankle dorsiflexion    Ankle plantarflexion    Ankle inversion    Ankle eversion     (Blank rows = not tested)  LOWER EXTREMITY MMT:  MMT Right eval Left eval  Hip flexion 32.9 43.3  Hip extension    Hip abduction    Hip adduction    Hip internal  rotation    Hip external rotation    Knee flexion    Knee extension 40.2 48.5  Ankle dorsiflexion    Ankle plantarflexion    Ankle inversion    Ankle eversion     (Blank rows = not tested)    FUNCTIONAL TESTS:  Timed up and go (TUG): uable to tolerate without canes  STS from armed wc: heavy ue support Initial balance with cga  Balance FT: cl supervision  GAIT: Distance walked: 20 ft without ad Assistive device utilized: 2 canes (did not bring with her today) Level of assistance: CGA Comments: Antalgic gait:Offloading R and L, minimal knee flex during swing, hip circumduction to advance le                                                                                                                                TREATMENT  [facility] Adult PT Treatment:                                               09/25/23 -gait with FWW x39ft -Roller to L quad -SAQ bilateral- alternating x15min -PROM bil knees (minimal) -Partial bridge with focus on glute activation-LES on red physioball x6 -DKTC with red physioball x1.62min -Seated marching 2x10 -seated LAQ x5 no weight, x10ea 3# on R k -seated adductor  squeeze 5 2x10 -Nu step x34min L2 (seat 10)   09/20/23 -gait with FWW x47ft -Roller to L quad -SAQ bilateral- alternating x16min -PROM bil knees (minimal) -Partial bridge with focus on glute activation-LES on red physioball x6 -DKTC with red physioball x1.54min -Seated marching 2x10 -seated HR/TR 2x10 -seated LAQ x5 no weight, x10ea 3# -seated adductor squeeze 5 2x10 -Nu step x40min L2 (seat 10)  Date: 09/18/23 Pt seen for aquatic therapy today.  Treatment took place in water 3.5-4.75 ft in depth at the [facility] pool. Temp of water was 91.  Pt entered the pool via stairs in side-step motion leading with RLE with BUE on rail, exited the pool via lift chair.  - Unsupported:  forward/backward and side stepping 3.7 ft - ue support barbells standing 3.8 ft: toe raises; heel  raises (left ankle fused); HS curls; hip add/abd; hip extension.   (No burning in knees with exercise today) -4.3 in: tandem stance leading L then right ue support YHB x 20s   --SLS as above x 20s each.  -Noodle pull down yellow wide stance then staggered stances x 10,  3.8 ft -cycling on noodle across pool ue support HB -Straddling noodle ue support corner wall: hip add/abd;  hip flex/extension (good quad stretch) -HS and gastroc stretching at step 20s hold x 3 ea LE   Pt requires the buoyancy and hydrostatic pressure of water for support, and to offload joints by unweighting joint load by at least 50 % in navel deep water and by at least 75-80% in chest to neck deep water.  Viscosity of the water is needed for resistance of strengthening. Water current perturbations provides challenge to standing balance requiring increased core activation.       PATIENT EDUCATION:  Education details: intro to aquatic therapy  Person educated: Patient and Spouse Education method: Explanation Education comprehension: verbalized understanding  HOME EXERCISE PROGRAM: TBA  ASSESSMENT:  CLINICAL IMPRESSION:  L knee continues to be painful with knee flexion/extension. Pt very tender in distal quads and areas surrounding patella. Unable to tolerate attempts of patella mobilizations due to tenderness. Pt did respond well to [facility] using red physioball. Pt also reports benefit from Nu-step- will try in beginning of session vs end next time. Will continue to monitor pain level as we progress functional strength and mobility.    Pt negotiates stairs without difficulty forward facing.  Continues to use ue for support and step to pattern. Improved balance and reduction in pain as demonstrated through amb submerged unsupported.  Left knee continues to be more painful than right. She requires multiple rest periods throughout session for left knee discomfort. Pt considering joining [facility] for pool  access.       Initial impression:  Patient is a 78 y.o. f who was seen today for physical therapy evaluation and treatment for oa bilat knees. She presents with husband who assisted her in [place] to setting.  Pt reports pain in bilateral knees L>R and is wanting TKR but due to a cardiac illness earlier this year is not a candidate for another 11 months.  Her pain sensitivity is high.  She is unable to take pain meds nor anti-inflammatories due to cardiac issue. Exam demonstrates fairly good strength in quads.  Her fall risk is high as demonstrated through difficulty with immediate standing balance, requiring assistance to gain FT position and heavy ue assistance with all transitional movements..  She reports use of bilateral cane with amb in home if not furniture  surfing. She did not bring her canes and is unable to tolerate completing other functional tests. Pt has had  recent fall due to left knee giving out.  She is a good candidate for skilled PT both land and aquatic based.  Plan to use the properties of water initially to initiate movement and encourage muscle length, improve stability/balance and manage pain then transition onto land for increased loading to build strength  OBJECTIVE IMPAIRMENTS: Abnormal gait, decreased activity tolerance, decreased balance, decreased mobility, difficulty walking, decreased ROM, decreased strength, postural dysfunction, obesity, and pain.   ACTIVITY LIMITATIONS: carrying, lifting, bending, sitting, standing, squatting, sleeping, stairs, transfers, and locomotion level  PARTICIPATION LIMITATIONS: meal prep, cleaning, driving, shopping, community activity, and yard work  PERSONAL FACTORS: Time since onset of injury/illness/exacerbation and 1-2 comorbidities: see Pmhx are also affecting patient's functional outcome.   REHAB POTENTIAL: Good  CLINICAL DECISION MAKING: Evolving/moderate complexity  EVALUATION COMPLEXITY: Moderate   GOALS: Goals reviewed  with patient? Yes  SHORT TERM GOALS: Target date: 09/13/23  Pt will tolerate full aquatic sessions consistently without increase in pain and with improving function to demonstrate good toleration and effectiveness of intervention.  Baseline: Goal status: Met 09/11/23  2.  Pt will negotiate steps entering and exiting pool Baseline:  Goal status: In progress 09/11/23  3.  Pt will have information and make decisions on accessing pool in future for management of condtion Baseline:  Goal status: In progress 09/11/23; Met 09/19/23    LONG TERM GOALS: Target date: 10/11/23  Pt to improve on LEFS by at least 9 point to demonstrate statistically significant Improvement in function. Baseline:30/80  Goal status: INITIAL  2.  Pt will be indep with final HEP's (land and aquatic as appropriate) for continued management of condition Baseline:  Goal status: INITIAL  3.  Pt will tolerate walking 10 consecutive minutes to demonstrate improved toleration to amb Baseline:  Goal status: INITIAL  4.  Pt will tolerate walking to or from setting (500 ft)  using approp AD and engaging in aquatic therapy session without excessive fatigue or increase in pain to demonstrate improved toleration to activity.  Baseline:  Goal status: INITIAL  5.  Pt will report decrease in pain by at least 50% for improved toleration to activity/quality of life and to demonstrate improved management of pain. Baseline:  Goal status: INITIAL  6.  Pt will report improvement in functional mobility and ADL's at home Baseline: wants to at least maintain Goal status: INITIAL   PLAN:  PT FREQUENCY: 2x/week  PT DURATION: 8 weeks  PLANNED INTERVENTIONS: 97164- PT Re-evaluation, 97750- Physical Performance Testing, 97110-Therapeutic exercises, 97530- Therapeutic activity, 97112- Neuromuscular re-education, 97535- Self Care, 02859- Manual therapy, [phone]- Gait training, [phone]- Orthotic Initial, [phone]- Aquatic Therapy, [phone]-  Electrical stimulation (unattended), [phone]- Ionotophoresis 4mg /ml Dexamethasone , 79439 (1-2 muscles), [phone] (3+ muscles)- Dry Needling, Patient/Family education, Balance training, Stair training, Taping, Joint mobilization, DME instructions, Cryotherapy, and Moist heat  Approved codes (insurance auth): therapeutic exercise, neuro -re ed; gait training; manual therapy; therapeutic activity; self care; performance testing; aquatic therapy; mechanical traction; US ; orthotic fit; vaso; PT re-eval.   PLAN FOR NEXT SESSION: aquatic for first 3-4 weeks tthe alternate with land  Asberry Rodes, PTA  09/25/23 11:39 AM [facility] [address] [place], [place], [address] Phone: [phone]   Fax:  [phone]     Date of referral: 06/04/23 Referring provider:   Catherene Toribio Pac, MD   Referring diagnosis? M17.0 (ICD-10-CM) - Bilateral  primary osteoarthritis of knee  Treatment diagnosis? (if different than referring diagnosis) no  What was this (referring dx) caused by? Arthritis  Nature of Condition: Chronic (continuous duration > 3 months)   Laterality: Both  Current Functional Measure Score: LEFS 30/80  Objective measurements identify impairments when they are compared to normal values, the uninvolved extremity, and prior level of function.  [x]  Yes  []  No  Objective assessment of functional ability: Moderate functional limitations   Briefly describe symptoms: chronic knee pain due to OA  How did symptoms start: chronic pain  Average pain intensity:  Last 24 hours: 4/10  Past week: 4/10  How often does the pt experience symptoms? Constantly  How much have the symptoms interfered with usual daily activities? Quite a bit  How has condition changed since care began at this facility? NA - initial visit  In general, how is the patients overall health? Good   BACK PAIN (STarT Back Screening Tool) No

## 2023-09-27 ENCOUNTER — Encounter (HOSPITAL_BASED_OUTPATIENT_CLINIC_OR_DEPARTMENT_OTHER): Payer: Self-pay | Admitting: Physical Therapy

## 2023-09-27 ENCOUNTER — Ambulatory Visit (HOSPITAL_BASED_OUTPATIENT_CLINIC_OR_DEPARTMENT_OTHER): Admitting: Physical Therapy

## 2023-09-27 DIAGNOSIS — R2681 Unsteadiness on feet: Secondary | ICD-10-CM

## 2023-09-27 DIAGNOSIS — G8929 Other chronic pain: Secondary | ICD-10-CM | POA: Diagnosis not present

## 2023-09-27 DIAGNOSIS — R262 Difficulty in walking, not elsewhere classified: Secondary | ICD-10-CM | POA: Diagnosis not present

## 2023-09-27 DIAGNOSIS — M25561 Pain in right knee: Secondary | ICD-10-CM | POA: Diagnosis not present

## 2023-09-27 DIAGNOSIS — M6281 Muscle weakness (generalized): Secondary | ICD-10-CM

## 2023-09-27 DIAGNOSIS — M25562 Pain in left knee: Secondary | ICD-10-CM | POA: Diagnosis not present

## 2023-09-27 NOTE — Therapy (Signed)
 OUTPATIENT PHYSICAL THERAPY LOWER EXTREMITY TREATMENT   Patient Name: Cassandra Ho MRN: [id number] DOB:1945-10-23, 78 y.o., female Today's Date: 09/27/2023  END OF SESSION:  PT End of Session - 09/27/23 1051     Visit Number 8    Number of Visits 16    Date for PT Re-Evaluation 10/11/23    Authorization Type [employer] mcr    Authorization Time Period 08/15/23-8/21/225    Authorization - Visit Number 8    Authorization - Number of Visits 12    Progress Note Due on Visit 10    PT Start Time 1052    PT Stop Time 1130    PT Time Calculation (min) 38 min    Activity Tolerance Patient tolerated treatment well    Behavior During Therapy WFL for tasks assessed/performed            Past Medical History:  Diagnosis Date   Allergic rhinitis    Arthritis    generalized-ankles and knees   BCC (basal cell carcinoma of skin)    Constipation    Dyslipidemia    Edema of both lower extremities    GERD (gastroesophageal reflux disease)    H/O: pituitary tumor    irradicated no problems now   Headache    migraines in past   High cholesterol    Hypertension    Hypothyroidism    Hypothyroidism (acquired)    Post-ablation   Joint pain    Morbid obesity (HCC)    OSA (obstructive sleep apnea)    intolerant to CPAP and now using an oral device   Osteoarthritis    Toxic solitary thyroid  nodule    radioactive iodine therapy with 31.4 mCi of I-131 on 12/05/2007   Transfusion history    with hip surgery   Past Surgical History:  Procedure Laterality Date   ANKLE ARTHROTOMY Left    ankle and foot reconstruction ;retained hardware   BREAST BIOPSY Bilateral    Many years ago, no scar seen    BREAST SURGERY     x2 biopsies(benign)   CARDIOVERSION N/A 03/14/2023   Procedure: CARDIOVERSION;  Surgeon: Raford Riggs, MD;  Location: [facility] INVASIVE CV LAB;  Service: Cardiovascular;  Laterality: N/A;   CARPAL TUNNEL RELEASE Bilateral    COLONOSCOPY WITH PROPOFOL  N/A 06/15/2014    Procedure: COLONOSCOPY WITH PROPOFOL ;  Surgeon: Gladis MARLA Louder, MD;  Location: [facility] ENDOSCOPY;  Service: Endoscopy;  Laterality: N/A;   D&C  1977   DILATION AND CURETTAGE OF UTERUS  1973   SAB   DILATION AND CURETTAGE OF UTERUS  1974   DILATION AND CURETTAGE OF UTERUS  1975   DILATION AND CURETTAGE OF UTERUS  1978   DILATION AND CURETTAGE OF UTERUS  1991   HIP FRACTURE SURGERY     repaired with cader bone and metal plates   IUD REMOVAL  2000   hysteroscopy   KNEE ARTHROSCOPY Right    torn meniscus     TRANSESOPHAGEAL ECHOCARDIOGRAM (CATH LAB) N/A 03/14/2023   Procedure: TRANSESOPHAGEAL ECHOCARDIOGRAM;  Surgeon: Raford Riggs, MD;  Location: [facility] INVASIVE CV LAB;  Service: Cardiovascular;  Laterality: N/A;   Patient Active Problem List   Diagnosis Date Noted   PAF (paroxysmal atrial fibrillation) (HCC) 03/22/2023   Obesity, class 3 03/13/2023   Hypothyroid 03/13/2023   Chronic heart failure with preserved ejection fraction (HFpEF) (HCC) 03/13/2023   Chronic HFrEF (heart failure with reduced ejection fraction) (HCC) 03/13/2023   Acute respiratory failure with hypoxia (HCC) 03/10/2023  Acute hypoxemic respiratory failure (HCC) 03/10/2023   Hypokalemia 03/09/2023   Atrial fibrillation with RVR (HCC) 03/08/2023   Acute on chronic systolic CHF (congestive heart failure) (HCC) 03/08/2023   Iatrogenic hyperthyroidism 03/08/2023   Urinary incontinence, mixed 01/07/2023   Vaginal erosion secondary to pessary use (HCC) 01/07/2023   Female genital prolapse 01/07/2023   Vaginal atrophy 01/07/2023   History of postmenopausal bleeding 01/07/2023   Nocturia 01/07/2023   Constipation 01/07/2023   Class 3 severe obesity with serious comorbidity and body mass index (BMI) of 45.0 to 49.9 in adult 11/27/2021   Vitamin D  deficiency 06/07/2021   Insulin  resistance 06/07/2021   OSA (obstructive sleep apnea) 10/16/2013   Dyslipidemia 10/16/2013   Obesity 10/16/2013   Essential hypertension,  benign 10/16/2013   Fitting and adjustment of pessary 10/16/2013    PCP: Virginia  Cleotilde PA  REFERRING PROVIDER: Catherene Toribio Pac, MD   REFERRING DIAG: M17.0 (ICD-10-CM) - Bilateral primary osteoarthritis of knee   THERAPY DIAG:  Bilateral chronic knee pain  Muscle weakness (generalized)  Difficulty in walking, not elsewhere classified  Unsteadiness on feet  Rationale for Evaluation and Treatment: Rehabilitation  ONSET DATE: chronic  SUBJECTIVE:   SUBJECTIVE STATEMENT: My husband says I'm moving much better.  I wasn't as sore after last (land) session as I was the previous one  Pt reports she lost her pool list; requests new copy.   POOL ACCESS: currently none.  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 Left knee> pain than right. 10 years ago left ankle reconstruction  Fell off of ladder ~ 74yrs ago and fx left femur.  Left knee goes out and I fall  PERTINENT HISTORY: Left femur fx 15 yrs ago Ankle arthrotomy 10 yrs ago OA CHF PAIN:  Are you having pain? Yes: NPRS scale: current 2/10 Pain location: bil knee Pain description: ache Aggravating factors: standing 4 minutes; walk <5 min Relieving factors: no OTC drugs due to bleeding risks, ice, resting, sitting  PRECAUTIONS: None  RED FLAGS: None   WEIGHT BEARING RESTRICTIONS: No  FALLS:  Has patient fallen in last 6 months? Yes. Number of falls 1  LIVING ENVIRONMENT: Lives with: lives with their family Lives in: House/apartment Stairs: Yes: Internal: 1 flight not used much to garage 5 steps  steps; on right going up Has following equipment at home: Single point cane, Wheelchair (manual), and transport chair  OCCUPATION: retired  PLOF: Ind with equipment  PATIENT GOALS: decrease pain,maintain mobility  NEXT MD VISIT: next month  OBJECTIVE:  Note: Objective measures were completed at Evaluation unless otherwise  noted.  DIAGNOSTIC FINDINGS: OA bilat knees. L>R  PATIENT SURVEYS:  LEFS   Extreme difficulty/unable (0), Quite a bit of difficulty (1), Moderate difficulty (2), Little difficulty (3), No difficulty (4) Survey date:   8/8  Any of your usual work, housework or school activities 2   2. Usual hobbies, recreational or sporting activities 3   3. Getting into/out of the bath 2   4. Walking between rooms 2   5. Putting on socks/shoes 3   6. Squatting  0   7. Lifting an object, like a bag of groceries from the floor 3   8. Performing light activities around your home 3   9. Performing heavy activities around your home 1   10. Getting into/out of a car 3   11. Walking 2 blocks 0   12. Walking 1 mile 0  13. Going up/down 10 stairs (1 flight) 1   14. Standing for 1 hour 0   15.  sitting for 1 hour 4   16. Running on even ground 0   17. Running on uneven ground 0   18. Making sharp turns while running fast 0   19. Hopping  1   20. Rolling over in bed 2   Score total:  30/80 25/80     COGNITION: Overall cognitive status: Within functional limits for tasks assessed     SENSATION: WFL   MUSCLE LENGTH: Hamstrings: tested in sitting slightly tight   POSTURE: rounded shoulders and left knee varus deformity  PALPATION: Minor TTP along joint lines bilateral knees  LOWER EXTREMITY ROM:  Active ROM Right eval Left eval  Hip flexion    Hip extension    Hip abduction    Hip adduction    Hip internal rotation    Hip external rotation    Knee flexion 110d 100d  Knee extension 0 0  Ankle dorsiflexion    Ankle plantarflexion    Ankle inversion    Ankle eversion     (Blank rows = not tested)  LOWER EXTREMITY MMT:  MMT Right eval Left eval  Hip flexion 32.9 43.3  Hip extension    Hip abduction    Hip adduction    Hip internal rotation    Hip external rotation    Knee flexion    Knee extension 40.2 48.5  Ankle dorsiflexion    Ankle plantarflexion    Ankle inversion     Ankle eversion     (Blank rows = not tested)    FUNCTIONAL TESTS:  Timed up and go (TUG): uable to tolerate without canes  STS from armed wc: heavy ue support Initial balance with cga  Balance FT: cl supervision  GAIT: Distance walked: 20 ft without ad Assistive device utilized: 2 canes (did not bring with her today) Level of assistance: CGA Comments: Antalgic gait:Offloading R and L, minimal knee flex during swing, hip circumduction to advance le                                                                                                                                TREATMENT  [facility] Adult PT Treatment:                                              Date: 09/27/23 Pt seen for aquatic therapy today.  Treatment took place in water 3.5-4.75 ft in depth at the [facility] pool. Temp of water was 91.  Pt entered the pool via stairs in side-step motion leading with RLE with BUE on rail, exited the pool stairs in step-to pattern  - Unsupported:  forward/backward in 4 ft, 2 laps of each - side stepping with arm  add/abdct with rainbow hand floats x 3 laps - UE support barbells standing 4 ft depth:  hip add/abdct x 20 each; hamstring curls alternating x 10 each; hip flexion/ extension x 20 each. - STS at bench in water with blue step under feet x 10; adductor ball squeeze with weighted ball, 10sec x 10 -Straddling noodle without UE support: cycling  09/25/23 -gait with FWW x40ft -Roller to L quad -SAQ bilateral- alternating x49min -PROM bil knees (minimal) -Partial bridge with focus on glute activation-LES on red physioball x6 -DKTC with red physioball x1.78min -Seated marching 2x10 -seated LAQ x5 no weight, x10ea 3# on R k -seated adductor squeeze 5 2x10 -Nu step x62min L2 (seat 10)   09/20/23 -gait with FWW x66ft -Roller to L quad -SAQ bilateral- alternating x42min -PROM bil knees (minimal) -Partial bridge with focus on glute activation-LES on red physioball x6 -DKTC  with red physioball x1.74min -Seated marching 2x10 -seated HR/TR 2x10 -seated LAQ x5 no weight, x10ea 3# -seated adductor squeeze 5 2x10 -Nu step x66min L2 (seat 10)  Date: 09/18/23 Pt seen for aquatic therapy today.  Treatment took place in water 3.5-4.75 ft in depth at the [facility] pool. Temp of water was 91.  Pt entered the pool via stairs in side-step motion leading with RLE with BUE on rail, exited the pool via lift chair.  - Unsupported:  forward/backward and side stepping 3.7 ft - ue support barbells standing 3.8 ft: toe raises; heel raises (left ankle fused); HS curls; hip add/abd; hip extension.   (No burning in knees with exercise today) -4.3 in: tandem stance leading L then right ue support YHB x 20s   --SLS as above x 20s each.  -Noodle pull down yellow wide stance then staggered stances x 10,  3.8 ft -cycling on noodle across pool ue support HB -Straddling noodle ue support corner wall: hip add/abd;  hip flex/extension (good quad stretch) -HS and gastroc stretching at step 20s hold x 3 ea LE   Pt requires the buoyancy and hydrostatic pressure of water for support, and to offload joints by unweighting joint load by at least 50 % in navel deep water and by at least 75-80% in chest to neck deep water.  Viscosity of the water is needed for resistance of strengthening. Water current perturbations provides challenge to standing balance requiring increased core activation.       PATIENT EDUCATION:  Education details: intro to aquatic therapy  Person educated: Patient and Spouse Education method: Explanation Education comprehension: verbalized understanding  HOME EXERCISE PROGRAM: TBA  ASSESSMENT:  CLINICAL IMPRESSION:   Left knee continues to be more painful than right. One episode of snapping in Lt knee with sit to stand on bench. She did not require any rest periods throughout session as she did in past aquatic therapy sessions. Pt able to exit pool via  stairs this visit without difficulty (met STG2).  Slight increase in knee pain to 3/10 initially, but pain reduced with change in speed and increased depth of submersion. Pt making good progress towards goals.   *issue list of pools at next visit.  Pt considering joining [facility] for pool access.       Initial impression:  Patient is a 78 y.o. f who was seen today for physical therapy evaluation and treatment for oa bilat knees. She presents with husband who assisted her in [facility] to setting.  Pt reports pain in bilateral knees L>R and is wanting TKR but due to a cardiac illness  earlier this year is not a candidate for another 11 months.  Her pain sensitivity is high.  She is unable to take pain meds nor anti-inflammatories due to cardiac issue. Exam demonstrates fairly good strength in quads.  Her fall risk is high as demonstrated through difficulty with immediate standing balance, requiring assistance to gain FT position and heavy ue assistance with all transitional movements..  She reports use of bilateral cane with amb in home if not furniture surfing. She did not bring her canes and is unable to tolerate completing other functional tests. Pt has had  recent fall due to left knee giving out.  She is a good candidate for skilled PT both land and aquatic based.  Plan to use the properties of water initially to initiate movement and encourage muscle length, improve stability/balance and manage pain then transition onto land for increased loading to build strength  OBJECTIVE IMPAIRMENTS: Abnormal gait, decreased activity tolerance, decreased balance, decreased mobility, difficulty walking, decreased ROM, decreased strength, postural dysfunction, obesity, and pain.   ACTIVITY LIMITATIONS: carrying, lifting, bending, sitting, standing, squatting, sleeping, stairs, transfers, and locomotion level  PARTICIPATION LIMITATIONS: meal prep, cleaning, driving, shopping, community activity, and yard  work  PERSONAL FACTORS: Time since onset of injury/illness/exacerbation and 1-2 comorbidities: see Pmhx are also affecting patient's functional outcome.   REHAB POTENTIAL: Good  CLINICAL DECISION MAKING: Evolving/moderate complexity  EVALUATION COMPLEXITY: Moderate   GOALS: Goals reviewed with patient? Yes  SHORT TERM GOALS: Target date: 09/13/23  Pt will tolerate full aquatic sessions consistently without increase in pain and with improving function to demonstrate good toleration and effectiveness of intervention.  Baseline: Goal status: Met 09/11/23  2.  Pt will negotiate steps entering and exiting pool Baseline:  Goal status: MET  -09/27/23  3.  Pt will have information and make decisions on accessing pool in future for management of condtion Baseline:  Goal status:  Met 09/19/23    LONG TERM GOALS: Target date: 10/11/23  Pt to improve on LEFS by at least 9 point to demonstrate statistically significant Improvement in function. Baseline:see above  Goal status: in progress - 09/27/23  2.  Pt will be indep with final HEP's (land and aquatic as appropriate) for continued management of condition Baseline:  Goal status: INITIAL  3.  Pt will tolerate walking 10 consecutive minutes to demonstrate improved toleration to amb Baseline:  Goal status: INITIAL  4.  Pt will tolerate walking to or from setting (500 ft)  using approp AD and engaging in aquatic therapy session without excessive fatigue or increase in pain to demonstrate improved toleration to activity.  Baseline:  Goal status: INITIAL  5.  Pt will report decrease in pain by at least 50% for improved toleration to activity/quality of life and to demonstrate improved management of pain. Baseline:  Goal status: INITIAL  6.  Pt will report improvement in functional mobility and ADL's at home Baseline: wants to at least maintain Goal status: INITIAL   PLAN:  PT FREQUENCY: 2x/week  PT DURATION: 8 weeks  PLANNED  INTERVENTIONS: 97164- PT Re-evaluation, 97750- Physical Performance Testing, 97110-Therapeutic exercises, 97530- Therapeutic activity, 97112- Neuromuscular re-education, 97535- Self Care, 02859- Manual therapy, [phone]- Gait training, [phone]- Orthotic Initial, [phone]- Aquatic Therapy, [phone]- Electrical stimulation (unattended), [phone]- Ionotophoresis 4mg /ml Dexamethasone , 79439 (1-2 muscles), [phone] (3+ muscles)- Dry Needling, Patient/Family education, Balance training, Stair training, Taping, Joint mobilization, DME instructions, Cryotherapy, and Moist heat  Approved codes (insurance auth): therapeutic exercise, neuro -re ed; gait training; manual therapy; therapeutic  activity; self care; performance testing; aquatic therapy; mechanical traction; US ; orthotic fit; vaso; PT re-eval.   PLAN FOR NEXT SESSION: aquatic for first 3-4 weeks tthe alternate with land   Delon Aquas, PTA 09/27/23 11:52 AM [facility] [address] [place], [place], [address] Phone: [phone]   Fax:  [phone]     Date of referral: 06/04/23 Referring provider:   Catherene Toribio Pac, MD   Referring diagnosis? M17.0 (ICD-10-CM) - Bilateral primary osteoarthritis of knee  Treatment diagnosis? (if different than referring diagnosis) no  What was this (referring dx) caused by? Arthritis  Nature of Condition: Chronic (continuous duration > 3 months)   Laterality: Both  Current Functional Measure Score: LEFS 30/80  Objective measurements identify impairments when they are compared to normal values, the uninvolved extremity, and prior level of function.  [x]  Yes  []  No  Objective assessment of functional ability: Moderate functional limitations   Briefly describe symptoms: chronic knee pain due to OA  How did symptoms start: chronic pain  Average pain intensity:  Last 24 hours: 4/10  Past week: 4/10  How often does the pt experience symptoms?  Constantly  How much have the symptoms interfered with usual daily activities? Quite a bit  How has condition changed since care began at this facility? NA - initial visit  In general, how is the patients overall health? Good   BACK PAIN (STarT Back Screening Tool) No

## 2023-09-29 ENCOUNTER — Encounter (HOSPITAL_COMMUNITY): Payer: Self-pay

## 2023-10-01 ENCOUNTER — Inpatient Hospital Stay (HOSPITAL_COMMUNITY)
Admission: AD | Admit: 2023-10-01 | Discharge: 2023-10-04 | DRG: 309 | Disposition: A | Discharge status: Home / Self Care | Attending: Cardiovascular Disease | Admitting: Cardiovascular Disease

## 2023-10-01 ENCOUNTER — Ambulatory Visit (HOSPITAL_COMMUNITY)
Admission: RE | Admit: 2023-10-01 | Discharge: 2023-10-01 | Disposition: A | Discharge status: Home / Self Care | Source: Ambulatory Visit | Attending: Physician Assistant | Admitting: Physician Assistant

## 2023-10-01 ENCOUNTER — Encounter (HOSPITAL_COMMUNITY): Payer: Self-pay | Admitting: Physician Assistant

## 2023-10-01 VITALS — BP 106/70 | HR 66 | Ht 66.0 in | Wt 282.0 lb

## 2023-10-01 DIAGNOSIS — E039 Hypothyroidism, unspecified: Secondary | ICD-10-CM | POA: Diagnosis not present

## 2023-10-01 DIAGNOSIS — D6869 Other thrombophilia: Secondary | ICD-10-CM | POA: Diagnosis not present

## 2023-10-01 DIAGNOSIS — Z85828 Personal history of other malignant neoplasm of skin: Secondary | ICD-10-CM | POA: Diagnosis not present

## 2023-10-01 DIAGNOSIS — G4733 Obstructive sleep apnea (adult) (pediatric): Secondary | ICD-10-CM | POA: Diagnosis present

## 2023-10-01 DIAGNOSIS — I480 Paroxysmal atrial fibrillation: Secondary | ICD-10-CM | POA: Diagnosis not present

## 2023-10-01 DIAGNOSIS — Z6841 Body Mass Index (BMI) 40.0 and over, adult: Secondary | ICD-10-CM | POA: Diagnosis not present

## 2023-10-01 DIAGNOSIS — I110 Hypertensive heart disease with heart failure: Secondary | ICD-10-CM | POA: Diagnosis not present

## 2023-10-01 DIAGNOSIS — I5022 Chronic systolic (congestive) heart failure: Secondary | ICD-10-CM | POA: Diagnosis present

## 2023-10-01 DIAGNOSIS — Z7901 Long term (current) use of anticoagulants: Secondary | ICD-10-CM | POA: Diagnosis not present

## 2023-10-01 DIAGNOSIS — I4819 Other persistent atrial fibrillation: Secondary | ICD-10-CM | POA: Diagnosis not present

## 2023-10-01 DIAGNOSIS — E7800 Pure hypercholesterolemia, unspecified: Secondary | ICD-10-CM | POA: Diagnosis not present

## 2023-10-01 DIAGNOSIS — Z79899 Other long term (current) drug therapy: Secondary | ICD-10-CM | POA: Diagnosis not present

## 2023-10-01 LAB — BASIC METABOLIC PANEL WITH GFR
BUN/Creatinine Ratio: 30 — ABNORMAL HIGH (ref 12–28)
BUN: 21 mg/dL (ref 8–27)
CO2: 23 mmol/L (ref 20–29)
Calcium: 9 mg/dL (ref 8.7–10.3)
Chloride: 104 mmol/L (ref 96–106)
Creatinine, Ser: 0.69 mg/dL (ref 0.57–1.00)
Glucose: 121 mg/dL — ABNORMAL HIGH (ref 70–99)
Potassium: 3.9 mmol/L (ref 3.5–5.2)
Sodium: 140 mmol/L (ref 134–144)
eGFR: 89 mL/min/1.73 (ref >59)

## 2023-10-01 LAB — MAGNESIUM: Magnesium: 2.1 mg/dL (ref 1.6–2.3)

## 2023-10-01 MED ORDER — DOFETILIDE 500 MCG PO CAPS
500.0000 ug | ORAL_CAPSULE | Freq: Two times a day (BID) | ORAL | Status: DC
  Administered 2023-10-01 – 2023-10-04 (×9): 500 ug via ORAL
  Filled 2023-10-01 (×6): qty 1

## 2023-10-01 MED ORDER — CELECOXIB 50 MG PO CAPS: 50.0000 mg | ORAL_CAPSULE | ORAL | Status: DC | PRN

## 2023-10-01 MED ORDER — SODIUM CHLORIDE 0.9% FLUSH
3.0000 mL | Freq: Two times a day (BID) | INTRAVENOUS | Status: DC
  Administered 2023-10-01 – 2023-10-04 (×11): 3 mL via INTRAVENOUS

## 2023-10-01 MED ORDER — METOPROLOL SUCCINATE ER 25 MG PO TB24
25.0000 mg | ORAL_TABLET | Freq: Two times a day (BID) | ORAL | Status: DC
  Administered 2023-10-01 – 2023-10-04 (×7): 25 mg via ORAL
  Filled 2023-10-01 (×6): qty 1

## 2023-10-01 MED ORDER — SACUBITRIL-VALSARTAN 49-51 MG PO TABS
1.0000 | ORAL_TABLET | Freq: Two times a day (BID) | ORAL | Status: DC
  Administered 2023-10-02 – 2023-10-04 (×7): 1 via ORAL
  Filled 2023-10-01 (×6): qty 1

## 2023-10-01 MED ORDER — DULOXETINE HCL 60 MG PO CPEP
60.0000 mg | ORAL_CAPSULE | Freq: Every day | ORAL | Status: DC
  Administered 2023-10-02 – 2023-10-04 (×4): 60 mg via ORAL
  Filled 2023-10-01 (×3): qty 1

## 2023-10-01 MED ORDER — SPIRONOLACTONE 25 MG PO TABS
25.0000 mg | ORAL_TABLET | Freq: Every day | ORAL | Status: DC
  Administered 2023-10-02 – 2023-10-04 (×4): 25 mg via ORAL
  Filled 2023-10-01 (×3): qty 1

## 2023-10-01 MED ORDER — FUROSEMIDE 40 MG PO TABS
40.0000 mg | ORAL_TABLET | Freq: Two times a day (BID) | ORAL | Status: DC
  Administered 2023-10-01 – 2023-10-04 (×9): 40 mg via ORAL
  Filled 2023-10-01 (×6): qty 1

## 2023-10-01 MED ORDER — LEVOTHYROXINE SODIUM 75 MCG PO TABS
125.0000 ug | ORAL_TABLET | Freq: Every day | ORAL | Status: DC
  Administered 2023-10-02 – 2023-10-04 (×4): 125 ug via ORAL
  Filled 2023-10-01 (×3): qty 1

## 2023-10-01 MED ORDER — SODIUM CHLORIDE 0.9% FLUSH: 3.0000 mL | INTRAVENOUS | Status: DC | PRN

## 2023-10-01 MED ORDER — POTASSIUM CHLORIDE CRYS ER 20 MEQ PO TBCR
40.0000 meq | EXTENDED_RELEASE_TABLET | Freq: Once | ORAL | Status: AC
  Administered 2023-10-01 (×2): 40 meq via ORAL
  Filled 2023-10-01: qty 2

## 2023-10-01 MED ORDER — PSYLLIUM 95 % PO PACK
1.0000 | PACK | Freq: Every day | ORAL | Status: DC
  Administered 2023-10-02 – 2023-10-04 (×4): 1 via ORAL
  Filled 2023-10-01 (×3): qty 1

## 2023-10-01 MED ORDER — PSYLLIUM 95 % PO PACK: 1.0000 | PACK | Freq: Every day | ORAL | Status: DC

## 2023-10-01 MED ORDER — ROSUVASTATIN CALCIUM 20 MG PO TABS
20.0000 mg | ORAL_TABLET | Freq: Every day | ORAL | Status: DC
  Administered 2023-10-02 – 2023-10-04 (×4): 20 mg via ORAL
  Filled 2023-10-01 (×3): qty 1

## 2023-10-01 MED ORDER — SODIUM CHLORIDE 0.9 % IV SOLN: 250.0000 mL | INTRAVENOUS | Status: AC | PRN

## 2023-10-01 MED ORDER — APIXABAN 5 MG PO TABS
5.0000 mg | ORAL_TABLET | Freq: Two times a day (BID) | ORAL | Status: DC
  Administered 2023-10-01 – 2023-10-04 (×9): 5 mg via ORAL
  Filled 2023-10-01 (×6): qty 1

## 2023-10-01 MED ORDER — FAMOTIDINE 20 MG PO TABS: 20.0000 mg | ORAL_TABLET | Freq: Every day | ORAL | Status: DC | PRN

## 2023-10-01 MED ORDER — ACETAMINOPHEN 325 MG PO TABS: 650.0000 mg | ORAL_TABLET | Freq: Four times a day (QID) | ORAL | Status: DC | PRN

## 2023-10-01 NOTE — Progress Notes (Signed)
 Primary Care Physician: Cleotilde, [place]  E, PA Primary Cardiologist: None Electrophysiologist: Eulas FORBES Furbish, MD  Referring Physician: Dr Furbish Avelina LITTIE Cassandra Ho is a 78 y.o. female with a history of OSA, HLD, CHF, hyperthyroidism, atrial fibrillation who presents for follow up in the [facility].  In January 2025 she experienced a severe upper respiratory infection lasting three weeks, during which she felt too ill to seek medical attention. Eventually, she went to urgent care due to difficulty breathing and was found to be in atrial fibrillation, leading to a seven-day hospital stay. Amiodarone  was started. She was seen by Dr Furbish and recommended transitioning from amiodarone  to dofetilide  due to side effect profile. Patient is on Eliquis  for stroke prevention.    Patient presents today for follow up for atrial fibrillation and dofetilide  admission. She remains in SR today. She denies any missed doses of anticoagulation in the past 3 weeks.   Today, she denies symptoms of palpitations, chest pain, shortness of breath, orthopnea, PND, lower extremity edema, dizziness, presyncope, syncope, bleeding, or neurologic sequela. The patient is tolerating medications without difficulties and is otherwise without complaint today.    Atrial Fibrillation Risk Factors:  she does have symptoms or diagnosis of sleep apnea. she does not have a history of rheumatic fever.   Atrial Fibrillation Management history:  Previous antiarrhythmic drugs: amiodarone   Previous cardioversions: 03/14/23 Previous ablations: none Anticoagulation history: Eliquis   ROS- All systems are reviewed and negative except as per the HPI above.  Past Medical History:  Diagnosis Date   Allergic rhinitis    Arthritis    generalized-ankles and knees   BCC (basal cell carcinoma of skin)    Constipation    Dyslipidemia    Edema of both lower extremities    GERD (gastroesophageal  reflux disease)    H/O: pituitary tumor    irradicated no problems now   Headache    migraines in past   High cholesterol    Hypertension    Hypothyroidism    Hypothyroidism (acquired)    Post-ablation   Joint pain    Morbid obesity (HCC)    OSA (obstructive sleep apnea)    intolerant to CPAP and now using an oral device   Osteoarthritis    Toxic solitary thyroid  nodule    radioactive iodine therapy with 31.4 mCi of I-131 on 12/05/2007   Transfusion history    with hip surgery    Current Outpatient Medications  Medication Sig Dispense Refill   acetaminophen  (TYLENOL ) 325 MG tablet Take 650 mg by mouth every 6 (six) hours as needed for moderate pain (pain score 4-6).     apixaban  (ELIQUIS ) 5 MG TABS tablet Take 1 tablet (5 mg total) by mouth 2 (two) times daily. 60 tablet 5   celecoxib  (CELEBREX ) 50 MG capsule Take 50 mg by mouth as needed for pain.     conjugated estrogens  (PREMARIN ) vaginal cream Place 1 Applicatorful vaginally 2 (two) times a week. Place 0.5g nightly for two weeks then twice a week after 30 g 2   DULoxetine  (CYMBALTA ) 60 MG capsule Take 1 capsule by mouth daily.     empagliflozin  (JARDIANCE ) 10 MG TABS tablet Take 1 tablet (10 mg total) by mouth daily. 90 tablet 3   famotidine  (PEPCID ) 20 MG tablet Take 20 mg by mouth daily as needed for heartburn or indigestion.     furosemide  (LASIX ) 40 MG tablet Take 1 tablet (40 mg total) by mouth 2 (two)  times daily. 90 tablet 3   levothyroxine  (SYNTHROID ) 125 MCG tablet Take 1 tablet (125 mcg total) by mouth daily before breakfast. 30 tablet 0   metoprolol  succinate (TOPROL -XL) 25 MG 24 hr tablet Take 1 tablet (25 mg total) by mouth 2 (two) times daily. 180 tablet 3   psyllium (HYDROCIL/METAMUCIL) 95 % PACK Take 1 packet by mouth daily.     rosuvastatin  (CRESTOR ) 20 MG tablet Take 1 tablet (20 mg total) by mouth daily. Please make overdue appt with Dr. Shlomo before anymore refills. 2nd attempt 15 tablet 0    sacubitril -valsartan  (ENTRESTO ) 49-51 MG Take 1 tablet by mouth 2 (two) times daily. 60 tablet 11   spironolactone  (ALDACTONE ) 25 MG tablet Take 1 tablet (25 mg total) by mouth daily. 90 tablet 3   No current facility-administered medications for this encounter.    Physical Exam: BP 106/70   Pulse 66   Ht 5' 6 (1.676 m)   Wt 127.9 kg   BMI 45.52 kg/m   GEN: Well nourished, well developed in no acute distress CARDIAC: Regular rate and rhythm, no murmurs, rubs, gallops RESPIRATORY:  Clear to auscultation without rales, wheezing or rhonchi  ABDOMEN: Soft, non-tender, non-distended EXTREMITIES:  No edema; No deformity   Wt Readings from Last 3 Encounters:  10/01/23 127.9 kg  09/12/23 127.9 kg  07/10/23 127.9 kg     EKG today demonstrates  SR Vent. rate 66 BPM PR interval 162 ms QRS duration 98 ms QT/QTcB 428/448 ms   Echo 03/10/23 demonstrated   1. Left ventricular ejection fraction, by estimation, is 30%. The left  ventricle has moderately decreased function. The left ventricle  demonstrates global hypokinesis. Left ventricular diastolic function could  not be evaluated.   2. Right ventricular systolic function is normal. The right ventricular  size is normal.   3. The mitral valve was not well visualized. Mild mitral valve  regurgitation. No evidence of mitral stenosis.   4. The aortic valve was not well visualized. Aortic valve regurgitation  is not visualized.   5. The inferior vena cava is dilated in size with >50% respiratory  variability, suggesting right atrial pressure of 8 mmHg.     CHA2DS2-VASc Score = 5  The patient's score is based upon: CHF History: 1 HTN History: 1 Diabetes History: 0 Stroke History: 0 Vascular Disease History: 0 Age Score: 2 Gender Score: 1       ASSESSMENT AND PLAN: Persistent Atrial Fibrillation (ICD10:  I48.19) The patient's CHA2DS2-VASc score is 5, indicating a 7.2% annual risk of stroke.   Patient presents for  dofetilide  admission Continue Eliquis  5 mg BID, states no missed doses in the last 3 weeks. No recent benadryl use PharmD has screened medications for QT prolonging agents.  QTc in SR 448 ms Labs today show creatinine at 0.69, K+ 3.9 and mag 2.1, CrCl calculated at 138 mL/min Continue Toprol  25 mg BID  Secondary Hypercoagulable State (ICD10:  D68.69) The patient is at significant risk for stroke/thromboembolism based upon her CHA2DS2-VASc Score of 5.  Continue Apixaban  (Eliquis ). No bleeding issues.   HFrecEF EF 54% on MRI GDMT per [facility] team Fluid status appears stable today  Obesity Body mass index is 45.52 kg/m.  Encouraged lifestyle modification  OSA  Encouraged nightly CPAP    To be admitted later today once a bed becomes available.     Lallie Kemp [facility] [address] [place], [place] [address] [phone]

## 2023-10-01 NOTE — H&P (Signed)
 Primary Care Physician: Cleotilde, [place]  E, PA Primary Cardiologist: None Electrophysiologist: Cassandra FORBES Furbish, MD  Referring Physician: Dr Ho Cassandra Ho Cassandra Ho is a 78 y.o. female with a history of OSA, HLD, CHF, hyperthyroidism, atrial fibrillation who presents for follow up in the [facility].  In January 2025 she experienced a severe upper respiratory infection lasting three weeks, during which she felt too ill to seek medical attention. Eventually, she went to urgent care due to difficulty breathing and was found to be in atrial fibrillation, leading to a seven-day hospital stay. Amiodarone  was started. She was seen by Dr Ho and recommended transitioning from amiodarone  to dofetilide  due to side effect profile. Patient is on Eliquis  for stroke prevention.    Patient presents today for follow up for atrial fibrillation and dofetilide  admission. She remains in SR today. She denies any missed doses of anticoagulation in the past 3 weeks.   Today, she denies symptoms of palpitations, chest pain, shortness of breath, orthopnea, PND, lower extremity edema, dizziness, presyncope, syncope, bleeding, or neurologic sequela. The patient is tolerating medications without difficulties and is otherwise without complaint today.    Atrial Fibrillation Risk Factors:  she does have symptoms or diagnosis of sleep apnea. she does not have a history of rheumatic fever.   Atrial Fibrillation Management history:  Previous antiarrhythmic drugs: amiodarone   Previous cardioversions: 03/14/23 Previous ablations: none Anticoagulation history: Eliquis   ROS- All systems are reviewed and negative except as per the HPI above.  Past Medical History:  Diagnosis Date   Allergic rhinitis    Arthritis    generalized-ankles and knees   BCC (basal cell carcinoma of skin)    Constipation    Dyslipidemia    Edema of both lower extremities    GERD (gastroesophageal  reflux disease)    H/O: pituitary tumor    irradicated no problems now   Headache    migraines in past   High cholesterol    Hypertension    Hypothyroidism    Hypothyroidism (acquired)    Post-ablation   Joint pain    Morbid obesity (HCC)    OSA (obstructive sleep apnea)    intolerant to CPAP and now using an oral device   Osteoarthritis    Toxic solitary thyroid  nodule    radioactive iodine therapy with 31.4 mCi of I-131 on 12/05/2007   Transfusion history    with hip surgery    Current Facility-Administered Medications  Medication Dose Route Frequency Provider Last Rate Last Admin   0.9 %  sodium chloride  infusion  250 mL Intravenous PRN Mealor, Augustus E, MD       acetaminophen  (TYLENOL ) tablet 650 mg  650 mg Oral Q6H PRN Fenton, Clint R, PA       apixaban  (ELIQUIS ) tablet 5 mg  5 mg Oral BID Mealor, Augustus E, MD       dofetilide  (TIKOSYN ) capsule 500 mcg  500 mcg Oral BID Mealor, Augustus E, MD       [START ON 10/02/2023] DULoxetine  (CYMBALTA ) DR capsule 60 mg  60 mg Oral Daily Fenton, Clint R, PA       famotidine  (PEPCID ) tablet 20 mg  20 mg Oral Daily PRN Fenton, Clint R, PA       furosemide  (LASIX ) tablet 40 mg  40 mg Oral BID Fenton, Clint R, PA       [START ON 10/02/2023] levothyroxine  (SYNTHROID ) tablet 125 mcg  125 mcg Oral QAC breakfast Fenton, Clint  R, PA       metoprolol  succinate (TOPROL -XL) 24 hr tablet 25 mg  25 mg Oral BID Fenton, Clint R, PA       [START ON 10/02/2023] psyllium (HYDROCIL/METAMUCIL) 1 packet  1 packet Oral Daily Mealor, Augustus E, MD       [START ON 10/02/2023] rosuvastatin  (CRESTOR ) tablet 20 mg  20 mg Oral Daily Fenton, Clint R, PA       sacubitril -valsartan  (ENTRESTO ) 49-51 mg per tablet  1 tablet Oral BID Fenton, Clint R, PA       sodium chloride  flush (NS) 0.9 % injection 3 mL  3 mL Intravenous Q12H Mealor, Augustus E, MD   3 mL at 10/01/23 1507   sodium chloride  flush (NS) 0.9 % injection 3 mL  3 mL Intravenous PRN Mealor, Augustus E, MD        [START ON 10/02/2023] spironolactone  (ALDACTONE ) tablet 25 mg  25 mg Oral Daily Fenton, Clint R, PA        Physical Exam: BP (!) 111/56   Pulse 73   Temp 97.9 F (36.6 C) (Oral)   Resp 18   Ht 5' 6 (1.676 m)   Wt 129.7 kg   SpO2 95%   BMI 46.16 kg/m   GEN: Well nourished, well developed in no acute distress CARDIAC: Regular rate and rhythm, no murmurs, rubs, gallops RESPIRATORY:  Clear to auscultation without rales, wheezing or rhonchi  ABDOMEN: Soft, non-tender, non-distended EXTREMITIES:  No edema; No deformity   Wt Readings from Last 3 Encounters:  10/01/23 129.7 kg  10/01/23 127.9 kg  09/12/23 127.9 kg     EKG today demonstrates  SR Vent. rate 66 BPM PR interval 162 ms QRS duration 98 ms QT/QTcB 428/448 ms   Echo 03/10/23 demonstrated   1. Left ventricular ejection fraction, by estimation, is 30%. The left  ventricle has moderately decreased function. The left ventricle  demonstrates global hypokinesis. Left ventricular diastolic function could  not be evaluated.   2. Right ventricular systolic function is normal. The right ventricular  size is normal.   3. The mitral valve was not well visualized. Mild mitral valve  regurgitation. No evidence of mitral stenosis.   4. The aortic valve was not well visualized. Aortic valve regurgitation  is not visualized.   5. The inferior vena cava is dilated in size with >50% respiratory  variability, suggesting right atrial pressure of 8 mmHg.     CHA2DS2-VASc Score = 5  The patient's score is based upon: CHF History: 1 HTN History: 1 Diabetes History: 0 Stroke History: 0 Vascular Disease History: 0 Age Score: 2 Gender Score: 1       ASSESSMENT AND PLAN: Persistent Atrial Fibrillation (ICD10:  I48.19) The patient's CHA2DS2-VASc score is 5, indicating a 7.2% annual risk of stroke.   Patient presents for dofetilide  admission Continue Eliquis  5 mg BID, states no missed doses in the last 3 weeks. No recent  benadryl use PharmD has screened medications for QT prolonging agents.  QTc in SR 448 ms Labs today show creatinine at 0.69, K+ 3.9 and mag 2.1, CrCl calculated at 138 mL/min Continue Toprol  25 mg BID  Secondary Hypercoagulable State (ICD10:  D68.69) The patient is at significant risk for stroke/thromboembolism based upon her CHA2DS2-VASc Score of 5.  Continue Apixaban  (Eliquis ). No bleeding issues.   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  Encouraged lifestyle modification  OSA  Encouraged nightly CPAP    To be admitted later today once a bed becomes available.     Cassandra Ho [facility] [address] [place], [place] [address] [phone]   ___________________________________________________________  Admit for high risk drug monitoring: Tikosyn  Loading.    Primary EP > Dr. Nancey  Admit Labs > K+ 3.9, Mg+ 2.1, Cr 0.69 / Cr Cl 140 mL/min EKG Review > QTc 448 ms in NSR on 10/01/23  Anticipated Tikosyn  Dose > 500 mcg BID Anticoagulation > Eliquis  for CHA2DS2-VASc 5    Cassandra Barrack, NP-C, AGACNP-BC [facility] - Electrophysiology  10/01/2023, 3:42 PM

## 2023-10-01 NOTE — Progress Notes (Signed)
 Pharmacy: Dofetilide  (Tikosyn ) - Initial Consult Assessment and Electrolyte Replacement  Pharmacy consulted to assist in monitoring and replacing electrolytes in this 78 y.o. female admitted on 10/01/2023 undergoing dofetilide  initiation.  Assessment:  Patient Exclusion Criteria: If any screening criteria checked as Yes, then  patient  should NOT receive dofetilide  until criteria item is corrected.  If "Yes" please indicate correction plan.  YES  NO Patient  Exclusion Criteria Correction Plan/Comments   [x]   []   Baseline QTc interval is greater than or equal to 440 msec. IF above YES box checked dofetilide  contraindicated unless patient has ICD; then may proceed if QTc 500-550 msec or with known ventricular conduction abnormalities may proceed with QTc 550-600 msec. QTc = 448 per clinic notes Continue Tikosyn  with close monitoring   []   [x]   Patient is known or suspected to have a digoxin level greater than 2 ng/ml: No results found for: DIGOXIN     []   [x]   Creatinine clearance less than 20 ml/min (calculated using Cockcroft-Gault, actual body weight and serum creatinine): Estimated Creatinine Clearance: 81.3 mL/min (by C-G formula based on SCr of 0.69 mg/dL).     []   [x]  Patient has received drugs known to prolong the QT intervals within the last 48 hour (examples: phenothiazines, tricyclics or tetracyclic antidepressants, macrolides, 1st generation H-1 antihistamines (especially diphenhydramine), fluoroquinolones, azoles, ondansetron , metoclopramide, promethazine).   Updated information on QT prolonging agents is available to be searched on the following database:QT prolonging agents -If SSRI or antihistamine needed, preferred options are sertraline and loratadine  respectively     []   [x]  Patient received a dose of a thiazide diuretic in the last 48 hours [including hydrochlorothiazide  (Oretic ) alone or in any combination including triamterene (Dyazide, Maxzide)].    []    [x]  Patient received a medication known to increase dofetilide  plasma concentrations prior to initial dofetilide  dose:  Trimethoprim (Primsol, Proloprim) in the last 36 hours Verapamil (Calan, Verelan) in the last 36 hours or a sustained release dose in the last 72 hours Megestrol (Megace) in the last 5 days  Cimetidine (Tagamet) in the last 6 hours Ketoconazole (Nizoral) in the last 24 hours Itraconazole (Sporanox) in the last 48 hours  Prochlorperazine (Compazine) in the last 36 hours     []   [x]   Patient is known to have a history of torsades de pointes; congenital or acquired long QT syndromes.    []   [x]   Patient has received a Class 1 and Class 3 antiarrhythmic with less than 2 half-lives since last dose. (Disopyramide, Quinidine, Procainamide, Lidocaine , Mexiletine, Flecainide, Propafenone, Sotalol, Dronedarone)    []   [x]   Patient has received amiodarone  therapy in the past 3 months or amiodarone  level is greater than 0.3 ng/ml.    Labs:    Component Value Date/Time   K 3.9 10/01/2023 0836   MG 2.1 10/01/2023 0836     Plan: Select One Calculated CrCl  Dose q12h  [x]  > 60 ml/min 500 mcg  []  40-60 ml/min 250 mcg  []  20-40 ml/min 125 mcg   [x]   Physician selected initial dose within range recommended for patients level of renal function - will monitor for response.  []   Physician selected initial dose outside of range recommended for patients level of renal function - will discuss if the dose should be altered at this time.   Patient has been appropriately anticoagulated with apixaban .  Potassium: K 3.8-3.9:  Hold Tikosyn  initiation and give KCl 40 mEq po x1 then begin Tikosyn  at least  2hr after KCl dose - do not need to recheck K   Magnesium : Mg >2: Appropriate to initiate Tikosyn , no replacement needed     Thank you for allowing pharmacy to participate in this patient's care   Prentice Poisson, PharmD Clinical Pharmacist **Pharmacist phone directory can now be  found on [email or web address] ([email or web address]).  Listed under [facility].

## 2023-10-02 ENCOUNTER — Other Ambulatory Visit (HOSPITAL_COMMUNITY): Payer: Self-pay

## 2023-10-02 ENCOUNTER — Other Ambulatory Visit: Payer: Self-pay

## 2023-10-02 ENCOUNTER — Telehealth (HOSPITAL_COMMUNITY): Payer: Self-pay | Admitting: Pharmacy Technician

## 2023-10-02 ENCOUNTER — Encounter (HOSPITAL_BASED_OUTPATIENT_CLINIC_OR_DEPARTMENT_OTHER)

## 2023-10-02 ENCOUNTER — Encounter (HOSPITAL_COMMUNITY): Payer: Self-pay | Admitting: Cardiovascular Disease

## 2023-10-02 LAB — BASIC METABOLIC PANEL WITH GFR
Anion gap: 6 (ref 5–15)
BUN: 16 mg/dL (ref 8–23)
CO2: 25 mmol/L (ref 22–32)
Calcium: 9.2 mg/dL (ref 8.9–10.3)
Chloride: 107 mmol/L (ref 98–111)
Creatinine, Ser: 0.79 mg/dL (ref 0.44–1.00)
GFR, Estimated: >60 mL/min (ref >60)
Glucose, Bld: 99 mg/dL (ref 70–99)
Potassium: 4 mmol/L (ref 3.5–5.1)
Sodium: 138 mmol/L (ref 135–145)

## 2023-10-02 LAB — MAGNESIUM: Magnesium: 2.2 mg/dL (ref 1.7–2.4)

## 2023-10-02 NOTE — Progress Notes (Signed)
   Electrophysiology Rounding Note  Patient Name: Cassandra Ho Date of Encounter: 10/02/2023  Primary Cardiologist: None  Electrophysiologist: Eulas FORBES Furbish, MD    Subjective   Pt remains in NSR on Tikosyn  500 mcg BID   QTc from EKG last pm shows stable QTc at  The patient is doing well today.  At this time, the patient denies chest pain, shortness of breath, or any new concerns.  Inpatient Medications    Scheduled Meds:  apixaban   5 mg Oral BID   dofetilide   500 mcg Oral BID   DULoxetine   60 mg Oral Daily   furosemide   40 mg Oral BID   levothyroxine   125 mcg Oral QAC breakfast   metoprolol  succinate  25 mg Oral BID   psyllium  1 packet Oral Daily   rosuvastatin   20 mg Oral Daily   sacubitril -valsartan   1 tablet Oral BID   sodium chloride  flush  3 mL Intravenous Q12H   spironolactone   25 mg Oral Daily   Continuous Infusions:  sodium chloride      PRN Meds: sodium chloride , acetaminophen , famotidine , sodium chloride  flush   Vital Signs    Vitals:   10/01/23 2017 10/01/23 2153 10/01/23 2347 10/02/23 0414  BP: (!) 110/52 (!) 105/50 (!) 102/52 107/62  Pulse: 60 63 (!) 57 (!) 59  Resp: 18 18 18 18   Temp: 98 F (36.7 C) 98 F (36.7 C) 97.8 F (36.6 C) (!) 97.5 F (36.4 C)  TempSrc: Oral  Oral Oral  SpO2: 92%  92% 92%  Weight:      Height:        Intake/Output Summary (Last 24 hours) at 10/02/2023 0733 Last data filed at 10/01/2023 1915 Gross per 24 hour  Intake 117 ml  Output --  Net 117 ml   Filed Weights   10/01/23 1412  Weight: 129.7 kg    Physical Exam    GEN- NAD, A&O x 3. Normal affect.  Lungs- CTAB, Normal effort.  Heart- Regular rate and rhythm. No M/G/R GI- Soft, NT, ND Extremities- No clubbing, cyanosis, or edema Skin- no rash or lesion  Labs    CBC No results for input(s): WBC, NEUTROABS, HGB, HCT, MCV, PLT in the last 72 hours.  Basic Metabolic Panel Recent Labs    91/87/74 0836 10/02/23 0349  NA  140 138  K 3.9 4.0  CL 104 107  CO2 23 25  GLUCOSE 121* 99  BUN 21 16  CREATININE 0.69 0.79  CALCIUM  9.0 9.2  MG 2.1 2.2    Telemetry   SB 50's - SR 70's  (personally reviewed)  Patient Profile     Cassandra Ho is a 78 y.o. female with a past medical history significant for persistent atrial fibrillation.  They were admitted for tikosyn  load.   Assessment & Plan    Persistent Atrial Fibrillation Pt remains in NSR on Tikosyn  500 mcg BID  Continue Eliquis  Creatinine, ser  0.79 (08/13 0349) Magnesium  2.2 (08/13 0349) Potassium4.0 (08/13 0349) No electrolyte supplementation needed  Plan for home Friday if QTc remains stable.   For questions or updates, please contact [facility] Please consult [email or web address] for contact info under Cardiology/STEMI.  Signed, Daphne Barrack, NP-C, AGACNP-BC [facility] - Electrophysiology  10/02/2023, 7:35 AM

## 2023-10-02 NOTE — Progress Notes (Signed)
 Morning EKG reviewed     Shows remains in NSR with stable QTc at 470 ms.  Continue  Tikosyn  500 mcg BID.   Potassium4.0 (08/13 0349) Magnesium  2.2 (08/13 0349) Creatinine, ser  0.79 (08/13 0349)  Plan for home Friday if QTc remains stable     Daphne Barrack, NP-C, AGACNP-BC [facility] - Electrophysiology  10/02/2023, 11:46 AM

## 2023-10-02 NOTE — Progress Notes (Signed)
 Pharmacy: Dofetilide  (Tikosyn ) - Follow Up Assessment and Electrolyte Replacement  Pharmacy consulted to assist in monitoring and replacing electrolytes in this 78 y.o. female admitted on 10/01/2023 undergoing dofetilide  initiation.   Labs:    Component Value Date/Time   K 4.0 10/02/2023 0349   MG 2.2 10/02/2023 0349     Plan: Potassium: K >/= 4: No additional supplementation needed  Magnesium: Mg > 2: No additional supplementation needed   Thank you for allowing pharmacy to participate in this patient's care   Prentice Poisson, PharmD Clinical Pharmacist **Pharmacist phone directory can now be found on [email or web address] ([email or web address]).  Listed under [facility].

## 2023-10-02 NOTE — TOC CM/SW Note (Addendum)
 Transition of Care [facility]) - Inpatient Brief Assessment   Patient Details  Name: Cassandra Ho MRN: [id number] Date of Birth: [birth year]  Transition of Care [facility]) CM/SW Contact:    Sudie Erminio Deems, RN Phone Number: 10/02/2023, 12:25 PM   Clinical Narrative: Patient presented for Tikosyn  Load. Case Manager spoke with the patient regarding co pay cost. Patient is agreeable to cost and would like to have the initial Rx filled via [facility] and the Rx refills 90 day supply escribed to [facility] in [place] No further needs identified at this time.   Transition of Care Asessment: Insurance and Status: Insurance coverage has been reviewed Patient has primary care physician: Yes Home environment has been reviewed: reviewed Prior level of function:: independent Prior/Current Home Services: No current home services Social Drivers of Health Review: SDOH reviewed no interventions necessary Readmission risk has been reviewed: Yes Transition of care needs: no transition of care needs at this time

## 2023-10-02 NOTE — Telephone Encounter (Signed)
 Patient Product/process development scientist completed.    The patient is insured through [facility]. Patient has Medicare and is not eligible for a copay card, but may be able to apply for patient assistance or Medicare RX Payment Plan (Patient Must reach out to their plan, if eligible for payment plan), if available.    Ran test claim for dofetilide (Tikosyn) 500 mcg and the current 30 day co-pay is $0.00.   This test claim was processed through [facility]- copay amounts may vary at other pharmacies due to pharmacy/plan contracts, or as the patient moves through the different stages of their insurance plan.     Morgan Arab, CPHT Pharmacy Technician III Certified Patient Advocate [facility] Pharm[facility] Patient Advocate Team Direct Number: [phone]  Fax: [phone]

## 2023-10-03 ENCOUNTER — Encounter (HOSPITAL_COMMUNITY)
Admission: AD | Disposition: A | Discharge status: Home / Self Care | Payer: Self-pay | Source: Home / Self Care | Attending: Cardiovascular Disease

## 2023-10-03 ENCOUNTER — Other Ambulatory Visit: Payer: Self-pay

## 2023-10-03 LAB — BASIC METABOLIC PANEL WITH GFR
Anion gap: 8 (ref 5–15)
BUN: 15 mg/dL (ref 8–23)
CO2: 26 mmol/L (ref 22–32)
Calcium: 9.1 mg/dL (ref 8.9–10.3)
Chloride: 104 mmol/L (ref 98–111)
Creatinine, Ser: 0.71 mg/dL (ref 0.44–1.00)
GFR, Estimated: >60 mL/min (ref >60)
Glucose, Bld: 96 mg/dL (ref 70–99)
Potassium: 3.9 mmol/L (ref 3.5–5.1)
Sodium: 138 mmol/L (ref 135–145)

## 2023-10-03 LAB — MAGNESIUM: Magnesium: 1.9 mg/dL (ref 1.7–2.4)

## 2023-10-03 SURGERY — CARDIOVERSION (CATH LAB)
Anesthesia: General

## 2023-10-03 MED ORDER — POTASSIUM CHLORIDE CRYS ER 20 MEQ PO TBCR
40.0000 meq | EXTENDED_RELEASE_TABLET | Freq: Once | ORAL | Status: AC
  Administered 2023-10-03: 40 meq via ORAL
  Filled 2023-10-03: qty 2

## 2023-10-03 MED ORDER — MAGNESIUM SULFATE 2 GM/50ML IV SOLN
2.0000 g | Freq: Once | INTRAVENOUS | Status: AC
  Administered 2023-10-03: 2 g via INTRAVENOUS
  Filled 2023-10-03: qty 50

## 2023-10-03 NOTE — Progress Notes (Signed)
 Pharmacy: Dofetilide  (Tikosyn ) - Follow Up Assessment and Electrolyte Replacement  Pharmacy consulted to assist in monitoring and replacing electrolytes in this 78 y.o. female admitted on 10/01/2023 undergoing dofetilide  initiation.  Labs:    Component Value Date/Time   K 3.9 10/03/2023 0429   MG 1.9 10/03/2023 0429     Plan: Potassium: K 3.8-3.9:  Give KCl 40 mEq po x1   Magnesium : Mg 1.8-2: Give Mg 2 gm IV x1    As patient has required on average 25 mEq of potassium replacement every day but with  K= 3.9, I suspect a daily potassium supplement will not be needed  Thank you for allowing pharmacy to participate in this patient's care   Prentice Poisson, PharmD Clinical Pharmacist **Pharmacist phone directory can now be found on [email or web address] ([email or web address]).  Listed under [facility].

## 2023-10-03 NOTE — Discharge Summary (Signed)
 ELECTROPHYSIOLOGY DISCHARGE SUMMARY    Patient ID: Cassandra Ho,  MRN: [id number], DOB/AGE: [birth year] 78 y.o.  Admit date: 10/01/2023 Discharge date: 10/04/2023  Primary Care Physician: Cleotilde Army BRAVO, PA  Primary Cardiologist: None  Electrophysiologist: Dr. Nancey   Primary Discharge Diagnosis:  Persistent atrial fibrillation status post Tikosyn  loading this admission  Secondary Discharge Diagnosis:  HFrEF  Secondary Hypercoagulable State  BMI 46  No Known Allergies   Procedures This Admission:  1.  Tikosyn  loading   Brief HPI: Cassandra Ho is a 78 y.o. female with a past medical history as noted above.  They were referred to EP for treatment options of atrial fibrillation.  Risks, benefits, and alternatives to Tikosyn  were reviewed with the patient who wished to proceed with admission for loading.  Hospital Course:  The patient was admitted and Tikosyn  was initiated.  Renal function and electrolytes were followed during the hospitalization.  Their QTc remained stable. The patient presented in sinus rhythm and did not require cardioversion. The patients QTc remained stable. She had electrolyte disturbances during admit requiring potassium replacement and was prescribed supplementation at discharge. They were monitored on telemetry up to discharge. On the day of discharge, they were examined by Dr. Nancey  who considered them stable for discharge to home.  Follow-up has been arranged with the Atrial Fibrillation clinic in approximately 1 week.   Physical Exam: Vitals:   10/03/23 1927 10/03/23 2259 10/04/23 0423 10/04/23 0807  BP: 126/63 (!) 115/57 112/70 104/75  Pulse: 64 (!) 58    Resp: 16 16 16    Temp: (!) 97.4 F (36.3 C) 97.8 F (36.6 C) 97.9 F (36.6 C)   TempSrc: Oral Oral Oral   SpO2: 96% 99%    Weight:      Height:        GEN- pleasant adult female sitting up in chair in NAD, A&O x 3. Normal affect.  Lungs- CTAB, Normal effort.   Heart- Regular rate and rhythm. No M/G/R GI- Soft, NT, ND Extremities- No clubbing, cyanosis, or edema Skin- no rash or lesion  Labs:   Lab Results  Component Value Date   WBC 6.3 04/15/2023   HGB 15.2 (H) 04/15/2023   HCT 46.9 (H) 04/15/2023   MCV 91.4 04/15/2023   PLT 207 04/15/2023    Recent Labs  Lab 10/04/23 0450  NA 141  K 3.9  CL 107  CO2 23  BUN 15  CREATININE 0.72  CALCIUM  9.1  GLUCOSE 95    Discharge Medications:  Allergies as of 10/04/2023   No Known Allergies      Medication List     TAKE these medications    acetaminophen  325 MG tablet Commonly known as: TYLENOL  Take 650 mg by mouth every 6 (six) hours as needed for moderate pain (pain score 4-6).   apixaban  5 MG Tabs tablet Commonly known as: Eliquis  Take 1 tablet (5 mg total) by mouth 2 (two) times daily.   celecoxib  50 MG capsule Commonly known as: CELEBREX  Take 50 mg by mouth as needed for pain.   conjugated estrogens  vaginal cream Commonly known as: PREMARIN  Place 1 Applicatorful vaginally 2 (two) times a week. Place 0.5g nightly for two weeks then twice a week after   dofetilide  500 MCG capsule Commonly known as: TIKOSYN  Take 1 capsule (500 mcg total) by mouth every 12 (twelve) hours.   DULoxetine  60 MG capsule Commonly known as: CYMBALTA  Take 1 capsule by mouth daily.   empagliflozin   10 MG Tabs tablet Commonly known as: Jardiance  Take 1 tablet (10 mg total) by mouth daily.   Entresto  49-51 MG Generic drug: sacubitril -valsartan  Take 1 tablet by mouth 2 (two) times daily.   famotidine  20 MG tablet Commonly known as: PEPCID  Take 20 mg by mouth daily as needed for heartburn or indigestion.   furosemide  40 MG tablet Commonly known as: LASIX  Take 1 tablet (40 mg total) by mouth 2 (two) times daily.   levothyroxine  125 MCG tablet Commonly known as: SYNTHROID  Take 1 tablet (125 mcg total) by mouth daily before breakfast.   metoprolol  succinate 25 MG 24 hr tablet Commonly  known as: TOPROL -XL Take 1 tablet (25 mg total) by mouth 2 (two) times daily.   potassium chloride  SA 20 MEQ tablet Commonly known as: KLOR-CON  M Take 1 tablet (20 mEq total) by mouth daily.   psyllium 95 % Pack Commonly known as: HYDROCIL/METAMUCIL Take 1 packet by mouth daily.   rosuvastatin  20 MG tablet Commonly known as: CRESTOR  Take 1 tablet (20 mg total) by mouth daily. Please make overdue appt with Dr. Shlomo before anymore refills. 2nd attempt   spironolactone  25 MG tablet Commonly known as: ALDACTONE  Take 1 tablet (25 mg total) by mouth daily.        Disposition:  Home with follow up in AF clinic in 1 week as in AVS.   Duration of Discharge Encounter:  APP time: 33 minutes   Signed, Daphne Barrack, NP-C, AGACNP-BC [facility] - Electrophysiology  10/04/2023, 12:21 PM

## 2023-10-03 NOTE — Progress Notes (Signed)
 Morning EKG reviewed     Shows remains in NSR with stable QTc at 481 ms.  Continue  Tikosyn  500 mcg BID.   Potassium3.9 (08/14 0429) Magnesium   1.9 (08/14 0429) Creatinine, ser  0.71 (08/14 0429)  Plan for home Friday if QTc remains stable     Daphne Barrack, NP-C, AGACNP-BC [facility] - Electrophysiology  10/03/2023, 10:11 AM

## 2023-10-03 NOTE — Progress Notes (Addendum)
   Electrophysiology Rounding Note  Patient Name: [name] Date of Encounter: 10/03/2023  Primary Cardiologist: None  Electrophysiologist: Eulas FORBES [place], MD    Subjective   Pt remains in NSR on Tikosyn  500 mcg BID   QTc from EKG last pm shows stable QTc at 477 ms  The patient is doing well today.  At this time, the patient denies chest pain, shortness of breath, or any new concerns.  Inpatient Medications    Scheduled Meds:  apixaban   5 mg Oral BID   dofetilide   500 mcg Oral BID   DULoxetine   60 mg Oral Daily   furosemide   40 mg Oral BID   levothyroxine   125 mcg Oral QAC breakfast   metoprolol  succinate  25 mg Oral BID   psyllium  1 packet Oral Daily   rosuvastatin   20 mg Oral Daily   sacubitril -valsartan   1 tablet Oral BID   sodium chloride  flush  3 mL Intravenous Q12H   spironolactone   25 mg Oral Daily   Continuous Infusions:  PRN Meds: acetaminophen , famotidine , sodium chloride  flush   Vital Signs    Vitals:   10/02/23 1600 10/02/23 1919 10/03/23 0010 10/03/23 0403  BP: 122/64 118/71 (!) 95/48 (!) 102/52  Pulse: 65 67 63 68  Resp:  16 18 18   Temp:  98.2 F (36.8 C) 98 F (36.7 C) 98 F (36.7 C)  TempSrc:  Oral Oral Oral  SpO2: 96% 95% 95% 95%  Weight:      Height:        Intake/Output Summary (Last 24 hours) at 10/03/2023 0639 Last data filed at 10/02/2023 2030 Gross per 24 hour  Intake 840 ml  Output --  Net 840 ml   Filed Weights   10/01/23 1412  Weight: 129.7 kg    Physical Exam    GEN- NAD, A&O x 3. Normal affect.  Lungs- CTAB, Normal effort.  Heart- Regular rate and rhythm. No M/G/R GI- Soft, NT, ND Extremities- No clubbing, cyanosis, or edema Skin- no rash or lesion  Labs    CBC No results for input(s): WBC, NEUTROABS, HGB, HCT, MCV, PLT in the last 72 hours. Basic Metabolic Panel Recent Labs    91/86/74 0349 10/03/23 0429  NA 138 138  K 4.0 3.9  CL 107 104  CO2 25 26  GLUCOSE 99 96  BUN 16 15   CREATININE 0.79 0.71  CALCIUM  9.2 9.1  MG 2.2 1.9    Telemetry    SB 50's - SR 80's (personally reviewed)  Patient Profile     Cassandra Ho is a 78 y.o. female with a past medical history significant for persistent atrial fibrillation.  They were admitted for tikosyn  load.   Assessment & Plan    Persistent Atrial Fibrillation Secondary Hypercoagulable State Pt remains in NSR on Tikosyn  500 mcg BID  Continue Eliquis  Creatinine, ser  0.71 (08/14 0429) Magnesium   1.9 (08/14 0429) Potassium3.9 (08/14 0429) Supplement both K and Mg per protocol    Plan for home Friday if QTc remains stable.   For questions or updates, please contact [facility] Please consult [email or web address] for contact info under Cardiology/STEMI.  Signed, Daphne Barrack, NP-C, AGACNP-BC [facility] - Electrophysiology  10/03/2023, 6:41 AM

## 2023-10-03 NOTE — Progress Notes (Signed)
 Mobility Specialist Progress Note;   10/03/23 1041  Mobility  Activity Ambulated with assistance (hallway)  Level of Assistance Contact guard assist, steadying assist  Assistive Device Front wheel walker  Distance Ambulated (ft) 150 ft  Activity Response Tolerated well  Mobility Referral Yes  Mobility visit 1 Mobility  Mobility Specialist Start Time (ACUTE ONLY) 1041  Mobility Specialist Stop Time (ACUTE ONLY) 1047  Mobility Specialist Time Calculation (min) (ACUTE ONLY) 6 min   Pt agreeable to mobility. Required MinG assistance during ambulation for safety. VSS throughout. C/o some fatigue once returned to bed. Pt left sitting on EoB with all needs met, call bell in reach.   Lauraine Erm Mobility Specialist Please contact via SecureChat or [employer] [phone]

## 2023-10-04 ENCOUNTER — Other Ambulatory Visit (HOSPITAL_COMMUNITY): Payer: Self-pay

## 2023-10-04 ENCOUNTER — Ambulatory Visit (HOSPITAL_BASED_OUTPATIENT_CLINIC_OR_DEPARTMENT_OTHER): Admitting: Physical Therapy

## 2023-10-04 ENCOUNTER — Other Ambulatory Visit: Payer: Self-pay

## 2023-10-04 LAB — MAGNESIUM: Magnesium: 2.1 mg/dL (ref 1.7–2.4)

## 2023-10-04 LAB — BASIC METABOLIC PANEL WITH GFR
Anion gap: 11 (ref 5–15)
BUN: 15 mg/dL (ref 8–23)
CO2: 23 mmol/L (ref 22–32)
Calcium: 9.1 mg/dL (ref 8.9–10.3)
Chloride: 107 mmol/L (ref 98–111)
Creatinine, Ser: 0.72 mg/dL (ref 0.44–1.00)
GFR, Estimated: >60 mL/min (ref >60)
Glucose, Bld: 95 mg/dL (ref 70–99)
Potassium: 3.9 mmol/L (ref 3.5–5.1)
Sodium: 141 mmol/L (ref 135–145)

## 2023-10-04 MED ORDER — DOFETILIDE 500 MCG PO CAPS
500.0000 ug | ORAL_CAPSULE | Freq: Two times a day (BID) | ORAL | 6 refills | Status: AC
  Filled 2023-10-04 – 2023-10-31 (×2): qty 60, 30d supply, fill #0
  Filled 2023-11-29: qty 60, 30d supply, fill #1
  Filled 2023-12-31: qty 60, 30d supply, fill #2
  Filled 2024-01-24: qty 60, 30d supply, fill #3
  Filled 2024-02-24: qty 60, 30d supply, fill #4

## 2023-10-04 MED ORDER — MAGNESIUM SULFATE 2 GM/50ML IV SOLN
2.0000 g | Freq: Once | INTRAVENOUS | Status: AC
  Administered 2023-10-04: 2 g via INTRAVENOUS
  Filled 2023-10-04: qty 50

## 2023-10-04 MED ORDER — POTASSIUM CHLORIDE CRYS ER 20 MEQ PO TBCR
20.0000 meq | EXTENDED_RELEASE_TABLET | Freq: Every day | ORAL | 6 refills | Status: AC
  Filled 2023-10-04: qty 30, 30d supply, fill #0

## 2023-10-04 MED ORDER — POTASSIUM CHLORIDE CRYS ER 20 MEQ PO TBCR
40.0000 meq | EXTENDED_RELEASE_TABLET | Freq: Once | ORAL | Status: AC
  Administered 2023-10-04: 40 meq via ORAL
  Filled 2023-10-04: qty 2

## 2023-10-04 NOTE — Care Management Important Message (Signed)
 Important Message  Patient Details  Name: Cassandra Ho MRN: [id number] Date of Birth: [birth year]   Important Message Given:  Yes - Medicare IM     Vonzell Arrie Sharps 10/04/2023, 11:04 AM

## 2023-10-04 NOTE — Progress Notes (Signed)
 Mobility Specialist Progress Note;   10/04/23 1049  Mobility  Activity Ambulated with assistance  Level of Assistance Contact guard assist, steadying assist  Assistive Device Front wheel walker  Distance Ambulated (ft) 130 ft  Activity Response Tolerated well  Mobility Referral Yes  Mobility visit 1 Mobility  Mobility Specialist Start Time (ACUTE ONLY) 1049  Mobility Specialist Stop Time (ACUTE ONLY) 1055  Mobility Specialist Time Calculation (min) (ACUTE ONLY) 6 min   Pt agreeable to mobility. Required light MinG asisstance during ambulation for safety. VSS throughout. C/o bilateral knee pain. Pt returned back to chair with all needs met, call bell in reach.   Lauraine Erm Mobility Specialist Please contact via SecureChat or [employer] [phone]

## 2023-10-04 NOTE — Progress Notes (Signed)
 Pharmacy: Dofetilide  (Tikosyn ) - Follow Up Assessment and Electrolyte Replacement  Pharmacy consulted to assist in monitoring and replacing electrolytes in this 78 y.o. female admitted on 10/01/2023 undergoing dofetilide  initiation.   Labs:    Component Value Date/Time   K 3.9 10/04/2023 0450   MG 2.1 10/04/2023 0450     Plan: Potassium: K 3.8-3.9:  Give KCl 40 mEq po x1   Magnesium : Mg 1.8-2: Give Mg 2 gm IV x1    As patient has required on average 30 mEq of potassium replacement every day, recommend discharging patient with prescription for:  Potassium chloride  20 mEq  daily  Thank you for allowing pharmacy to participate in this patient's care   Prentice Poisson, PharmD Clinical Pharmacist **Pharmacist phone directory can now be found on [email or web address] ([email or web address]).  Listed under [facility].

## 2023-10-04 NOTE — Progress Notes (Signed)
 EKG from yesterday evening 10/03/2023 reviewed     Shows remains in NSR with stable QTc at 474 ms.  Continue  Tikosyn  500 mcg BID.   Potassium3.9 (08/15 0450) Magnesium   2.1 (08/15 0450) Creatinine, ser  0.72 (08/15 0450)  Plan for home Friday if QTc remains stable     Daphne Barrack, NP-C, AGACNP-BC [facility] - Electrophysiology  10/04/2023, 6:43 AM

## 2023-10-07 ENCOUNTER — Encounter (HOSPITAL_COMMUNITY): Payer: Self-pay | Admitting: Cardiology

## 2023-10-07 ENCOUNTER — Ambulatory Visit (HOSPITAL_COMMUNITY)
Admission: RE | Admit: 2023-10-07 | Discharge: 2023-10-07 | Disposition: A | Discharge status: Home / Self Care | Source: Ambulatory Visit | Attending: Cardiology | Admitting: Cardiology

## 2023-10-07 VITALS — BP 104/60 | HR 67 | Ht 66.0 in

## 2023-10-07 DIAGNOSIS — Z7901 Long term (current) use of anticoagulants: Secondary | ICD-10-CM | POA: Insufficient documentation

## 2023-10-07 DIAGNOSIS — I5022 Chronic systolic (congestive) heart failure: Secondary | ICD-10-CM

## 2023-10-07 DIAGNOSIS — I5032 Chronic diastolic (congestive) heart failure: Secondary | ICD-10-CM

## 2023-10-07 DIAGNOSIS — E669 Obesity, unspecified: Secondary | ICD-10-CM | POA: Diagnosis not present

## 2023-10-07 DIAGNOSIS — M170 Bilateral primary osteoarthritis of knee: Secondary | ICD-10-CM | POA: Diagnosis not present

## 2023-10-07 DIAGNOSIS — I480 Paroxysmal atrial fibrillation: Secondary | ICD-10-CM | POA: Insufficient documentation

## 2023-10-07 DIAGNOSIS — E785 Hyperlipidemia, unspecified: Secondary | ICD-10-CM | POA: Diagnosis not present

## 2023-10-07 DIAGNOSIS — G4733 Obstructive sleep apnea (adult) (pediatric): Secondary | ICD-10-CM | POA: Diagnosis not present

## 2023-10-07 DIAGNOSIS — Z7984 Long term (current) use of oral hypoglycemic drugs: Secondary | ICD-10-CM | POA: Diagnosis not present

## 2023-10-07 DIAGNOSIS — E66813 Obesity, class 3: Secondary | ICD-10-CM

## 2023-10-07 DIAGNOSIS — E039 Hypothyroidism, unspecified: Secondary | ICD-10-CM | POA: Insufficient documentation

## 2023-10-07 DIAGNOSIS — Z79899 Other long term (current) drug therapy: Secondary | ICD-10-CM | POA: Diagnosis not present

## 2023-10-07 DIAGNOSIS — I110 Hypertensive heart disease with heart failure: Secondary | ICD-10-CM | POA: Diagnosis not present

## 2023-10-07 NOTE — Patient Instructions (Signed)
 Medication Changes:  None, continue current medications  Lab Work:  Labs done today, your results will be available in MyChart, we will contact you for abnormal readings.  Testing/Procedures:  Your physician has requested that you have an echocardiogram. Echocardiography is a painless test that uses sound waves to create images of your heart. It provides your doctor with information about the size and shape of your heart and how well your heart's chambers and valves are working. This procedure takes approximately one hour. There are no restrictions for this procedure. Please do NOT wear cologne, perfume, aftershave, or lotions (deodorant is allowed). Please arrive 15 minutes prior to your appointment time.  Please note: We ask at that you not bring children with you during ultrasound (echo/ vascular) testing. Due to room size and safety concerns, children are not allowed in the ultrasound rooms during exams. Our front office staff cannot provide observation of children in our lobby area while testing is being conducted. An adult accompanying a patient to their appointment will only be allowed in the ultrasound room at the discretion of the ultrasound technician under special circumstances. We apologize for any inconvenience.   Referrals:  You have been referred to Pharmacy Clinic for weight loss medication, they will call you  Special Instructions // Education:  Do the following things EVERYDAY: Weigh yourself in the morning before breakfast. Write it down and keep it in a log. Take your medicines as prescribed Eat low salt foods--Limit salt (sodium) to 2000 mg per day.  Stay as active as you can everyday Limit all fluids for the day to less than 2 liters   Follow-Up in: 4 months    At the [facility], you and your health needs are our priority. We have a designated team specialized in the treatment of Heart Failure. This Care Team includes your primary Heart  Failure Specialized Cardiologist (physician), Advanced Practice Providers (APPs- Physician Assistants and Nurse Practitioners), and Pharmacist who all work together to provide you with the care you need, when you need it.   You may see any of the following providers on your designated Care Team at your next follow up:  Dr. Toribio Fuel Dr. Ezra Shuck Dr. Ria Commander Dr. Odis Brownie Greig Mosses, NP Caffie Shed, [place] [facility] [place], [place] Beckey Coe, NP [place] Lee, NP Tinnie Redman, PharmD   Please be sure to bring in all your medications bottles to every appointment.   Need to Contact Us :  If you have any questions or concerns before your next appointment please send us  a message through [place] or call our office at [phone].    TO LEAVE A MESSAGE FOR THE NURSE SELECT OPTION 2, PLEASE LEAVE A MESSAGE INCLUDING: YOUR NAME DATE OF BIRTH CALL BACK NUMBER REASON FOR CALL**this is important as we prioritize the call backs  YOU WILL RECEIVE A CALL BACK THE SAME DAY AS LONG AS YOU CALL BEFORE 4:00 PM

## 2023-10-08 NOTE — Progress Notes (Signed)
 Primary Care: Cleotilde Jennell BRAVO, PA HF Cardiology: Dr. Rolan  Chief Complaint: CHF  HPI: 78 y.o. referred for evaluation of CHF from [facility] clinic.   Patient has a history of HTN, HLD, atrial fibrillation, OSA, hypothyroidism, pituitary tumor, and chronic HFrEF. Last cigarette 50 years ago. .   Admitted 03/08/23 with atrial fibrillation/RVR, new diagnosis.  Underwent cardioversion. Echo showed reduced LVEF 30-35%, normal RV, mild MR.  Placed on 25 mg Toprol  XL daily. Started on GDMT.   She was seen in the office 03/22/23 and was noted to be back in AF/RVR. She was sent to the ER and cardioverted in the ER, then sent home. Amiodarone  was started.   cMRI (4/25) showed LV EF 54%, RVEF 56%, small area of mid-wall LGE in the apical septum (?prior myocarditis).   She was seen by EP, not thought to be a candidate for AF ablation, and was started on Tikosyn  to maintain NSR.   Today she returns for AHF follow up. She is in NSR today. She uses a wheelchair when outside the house and a walker in the house.  She has severe bilateral knee pain from osteoarthritis and is doing PT.  No exertional dyspnea or chest pain, but not very active.  No orthopnea/PND.    ECG (personally reviewed): NSR, QTc 466 msec  PMH: 1. Pituitary tumor: Remote.  2. 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  3. OSA 4. HTN 5. Atrial fibrillation: Paroxysmal.  - TEE-guided DCCV 03/14/23.  - DCCV 03/22/23 - Tikosyn  started.  6. Chronic systolic CHF: Echo (1/25) with LVEF 30-35%, RV normal, mild MR - Cardiac MRI (4/25): LV EF 54%, RVEF 56%, small area of mid-wall LGE in the apical septum (?prior myocarditis).  7. Hyperlipidemia 8. Obesity  Current Outpatient Medications  Medication Sig Dispense Refill   acetaminophen  (TYLENOL ) 325 MG tablet Take 650 mg by mouth every 6 (six) hours as needed for moderate pain (pain score 4-6).     apixaban  (ELIQUIS ) 5 MG TABS tablet Take 1  tablet (5 mg total) by mouth 2 (two) times daily. 60 tablet 5   celecoxib  (CELEBREX ) 50 MG capsule Take 50 mg by mouth as needed for pain.     conjugated estrogens  (PREMARIN ) vaginal cream Place 1 Applicatorful vaginally 2 (two) times a week. Place 0.5g nightly for two weeks then twice a week after 30 g 2   dofetilide  (TIKOSYN ) 500 MCG capsule Take 1 capsule (500 mcg total) by mouth every 12 (twelve) hours. 60 capsule 6   DULoxetine  (CYMBALTA ) 60 MG capsule Take 1 capsule by mouth daily.     empagliflozin  (JARDIANCE ) 10 MG TABS tablet Take 1 tablet (10 mg total) by mouth daily. 90 tablet 3   famotidine  (PEPCID ) 20 MG tablet Take 20 mg by mouth daily as needed for heartburn or indigestion.     furosemide  (LASIX ) 40 MG tablet Take 1 tablet (40 mg total) by mouth 2 (two) times daily. 90 tablet 3   levothyroxine  (SYNTHROID ) 125 MCG tablet Take 1 tablet (125 mcg total) by mouth daily before breakfast. 30 tablet 0   metoprolol  succinate (TOPROL -XL) 25 MG 24 hr tablet Take 1 tablet (25 mg total) by mouth 2 (two) times daily. 180 tablet 3   potassium chloride  SA (KLOR-CON  M) 20 MEQ tablet Take 1 tablet (20 mEq total) by mouth daily. 30 tablet 6   psyllium (HYDROCIL/METAMUCIL) 95 % PACK Take 1 packet by mouth daily.     rosuvastatin  (CRESTOR )  20 MG tablet Take 1 tablet (20 mg total) by mouth daily. Please make overdue appt with Dr. Shlomo before anymore refills. 2nd attempt 15 tablet 0   sacubitril -valsartan  (ENTRESTO ) 49-51 MG Take 1 tablet by mouth 2 (two) times daily. 60 tablet 11   spironolactone  (ALDACTONE ) 25 MG tablet Take 1 tablet (25 mg total) by mouth daily. 90 tablet 3   zolpidem (AMBIEN) 5 MG tablet Take 5 mg by mouth at bedtime as needed for sleep.     No current facility-administered medications for this encounter.    No Known Allergies    Social History   Socioeconomic History   Marital status: Married    Spouse name: Helayne   Number of children: 3   Years of education: Not on file    Highest education level: Bachelor's degree (e.g., BA, AB, BS)  Occupational History   Occupation: Retired  Tobacco Use   Smoking status: Former    Types: Cigarettes   Smokeless tobacco: Never   Tobacco comments:    Former smoker 10/01/23  Vaping Use   Vaping status: Never Used  Substance and Sexual Activity   Alcohol use: Yes    Comment: ocass   Drug use: No   Sexual activity: Not Currently    Partners: Male    Birth control/protection: Abstinence  Other Topics Concern   Not on file  Social History Narrative   ** Merged History Encounter **       Social Drivers of Health   Financial Resource Strain: Low Risk  (03/12/2023)   Overall Financial Resource Strain (CARDIA)    Difficulty of Paying Living Expenses: Not very hard  Food Insecurity: No Food Insecurity (10/01/2023)   Hunger Vital Sign    Worried About Running Out of Food in the Last Year: Never true    Ran Out of Food in the Last Year: Never true  Transportation Needs: No Transportation Needs (10/01/2023)   PRAPARE - Administrator, Civil Service (Medical): No    Lack of Transportation (Non-Medical): No  Physical Activity: Not on file  Stress: Not on file  Social Connections: Socially Integrated (10/01/2023)   Social Connection and Isolation Panel    Frequency of Communication with Friends and Family: More than three times a week    Frequency of Social Gatherings with Friends and Family: More than three times a week    Attends Religious Services: More than 4 times per year    Active Member of [employer] or Organizations: Yes    Attends Banker Meetings: 1 to 4 times per year    Marital Status: Married  Catering manager Violence: Not At Risk (10/01/2023)   Humiliation, Afraid, Rape, and Kick questionnaire    Fear of Current or Ex-Partner: No    Emotionally Abused: No    Physically Abused: No    Sexually Abused: No      Family History  Problem Relation Age of Onset   Thyroid  cancer Mother     Lung cancer Mother    Melanoma Mother    Diabetes Mother    Thyroid  disease Mother    Alcohol abuse Mother    Bladder Cancer Mother    Alcoholism Father    Cirrhosis Father    High blood pressure Father    High Cholesterol Father    Fibromyalgia Sister    Breast cancer Sister        30s   Breast cancer Maternal Grandmother  60s   Breast cancer Paternal Grandmother        69s   Breast cancer Daughter 12   Breast cancer Maternal Aunt        60s    Vitals:   10/07/23 1020  BP: 104/60  Pulse: 67  SpO2: 94%  Height: 5' 6 (1.676 m)    Wt Readings from Last 3 Encounters:  10/01/23 129.7 kg (286 lb)  10/01/23 127.9 kg (282 lb)  09/12/23 127.9 kg (282 lb)   PHYSICAL EXAM: General: NAD, obese.  Neck: No JVD, no thyromegaly or thyroid  nodule.  Lungs: Clear to auscultation bilaterally with normal respiratory effort. CV: Nondisplaced PMI.  Heart regular S1/S2, no S3/S4, no murmur.  No peripheral edema.  No carotid bruit.  Normal pedal pulses.  Abdomen: Soft, nontender, no hepatosplenomegaly, no distention.  Skin: Intact without lesions or rashes.  Neurologic: Alert and oriented x 3.  Psych: Normal affect. Extremities: No clubbing or cyanosis.  HEENT: Normal.   ASSESSMENT & PLAN: 1. Chronic systolic, now recovered CHF: Echo in 1/25 showed EF 30-35%, normal RV, mild MR. 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  No FH of cardiomyopathy, no substance abuse.  No chest pain or CAD history. She is maintaining NSR on amiodarone . cMRI 4/25 EF 54%, RVEF 56%, small area of possible prior myocarditis apical septum. NYHA class II (confounded by severe knee pain), not volume overloaded on exam.  - Continue Lasix  40 mg bid.  BMET today.  - Continue spironolactone  25 mg daily.  - Continue Toprol  XL 25 mg bid - Continue Entresto  49/51 bid.  - Continue Jardiance  10 mg daily.  - I will arrange for echo to make sure EF remains up.  2.  Atrial fibrillation: Paroxysmal.  Diagnosed  in 1/25. DCCV to NSR 03/14/23 and again 03/22/23. She is now on Tikosyn .  BMI thought to be too high for ablation.  She is maintaining NSR with acceptable QTc today.  - Continue Tikosyn  500 mg bid.  Check BMET/Mg.   - Continue Toprol  XL 25 mg bid.  - Continue Eliquis  5 mg bid.  - Needs to do sleep study.  3. Hypothyroid: Continue levothyroxine .  4. HTN: BP not elevated.  5.OSA: Previously diagnosed with OSA. Pending sleep study  6. Obesity: Refer to pharmacy clinic to see she can get GLP-1 agonist for weight loss.   Follow up in 4 months with APP.   I spent 31 minutes reviewing records, interviewing/examining patient, and managing orders.   Ezra Shuck  10/08/2023

## 2023-10-09 ENCOUNTER — Encounter (HOSPITAL_BASED_OUTPATIENT_CLINIC_OR_DEPARTMENT_OTHER): Payer: Self-pay

## 2023-10-09 ENCOUNTER — Ambulatory Visit (HOSPITAL_BASED_OUTPATIENT_CLINIC_OR_DEPARTMENT_OTHER)

## 2023-10-09 DIAGNOSIS — R262 Difficulty in walking, not elsewhere classified: Secondary | ICD-10-CM

## 2023-10-09 DIAGNOSIS — G8929 Other chronic pain: Secondary | ICD-10-CM

## 2023-10-09 DIAGNOSIS — M6281 Muscle weakness (generalized): Secondary | ICD-10-CM

## 2023-10-09 DIAGNOSIS — M25561 Pain in right knee: Secondary | ICD-10-CM | POA: Diagnosis not present

## 2023-10-09 DIAGNOSIS — M25562 Pain in left knee: Secondary | ICD-10-CM | POA: Diagnosis not present

## 2023-10-09 DIAGNOSIS — R2681 Unsteadiness on feet: Secondary | ICD-10-CM | POA: Diagnosis not present

## 2023-10-09 NOTE — Therapy (Signed)
 OUTPATIENT PHYSICAL THERAPY LOWER EXTREMITY TREATMENT   Patient Name: Cassandra Ho MRN: [id number] DOB:21-Jun-1945, 78 y.o., female Today's Date: 10/09/2023  END OF SESSION:  PT End of Session - 10/09/23 1012     Visit Number 9    Number of Visits 16    Date for PT Re-Evaluation 10/11/23    Authorization Type [employer] mcr    Authorization Time Period 08/15/23-8/21/225    Authorization - Visit Number 9    Authorization - Number of Visits 12    Progress Note Due on Visit 10    PT Start Time 1015    PT Stop Time 1057    PT Time Calculation (min) 42 min    Activity Tolerance Patient tolerated treatment well    Behavior During Therapy WFL for tasks assessed/performed             Past Medical History:  Diagnosis Date   Allergic rhinitis    Arthritis    generalized-ankles and knees   BCC (basal cell carcinoma of skin)    Constipation    Dyslipidemia    Edema of both lower extremities    GERD (gastroesophageal reflux disease)    H/O: pituitary tumor    irradicated no problems now   Headache    migraines in past   High cholesterol    Hypertension    Hypothyroidism    Hypothyroidism (acquired)    Post-ablation   Joint pain    Morbid obesity (HCC)    OSA (obstructive sleep apnea)    intolerant to CPAP and now using an oral device   Osteoarthritis    Toxic solitary thyroid  nodule    radioactive iodine therapy with 31.4 mCi of I-131 on 12/05/2007   Transfusion history    with hip surgery   Past Surgical History:  Procedure Laterality Date   ANKLE ARTHROTOMY Left    ankle and foot reconstruction ;retained hardware   BREAST BIOPSY Bilateral    Many years ago, no scar seen    BREAST SURGERY     x2 biopsies(benign)   CARDIOVERSION N/A 03/14/2023   Procedure: CARDIOVERSION;  Surgeon: Raford Riggs, MD;  Location: [facility] INVASIVE CV LAB;  Service: Cardiovascular;  Laterality: N/A;   CARPAL TUNNEL RELEASE Bilateral    COLONOSCOPY WITH PROPOFOL  N/A 06/15/2014    Procedure: COLONOSCOPY WITH PROPOFOL ;  Surgeon: Gladis MARLA Louder, MD;  Location: [facility];  Service: Endoscopy;  Laterality: N/A;   D&C  1977   DILATION AND CURETTAGE OF UTERUS  1973   SAB   DILATION AND CURETTAGE OF UTERUS  1974   DILATION AND CURETTAGE OF UTERUS  1975   DILATION AND CURETTAGE OF UTERUS  1978   DILATION AND CURETTAGE OF UTERUS  1991   HIP FRACTURE SURGERY     repaired with cader bone and metal plates   IUD REMOVAL  2000   hysteroscopy   KNEE ARTHROSCOPY Right    torn meniscus     TRANSESOPHAGEAL ECHOCARDIOGRAM (CATH LAB) N/A 03/14/2023   Procedure: TRANSESOPHAGEAL ECHOCARDIOGRAM;  Surgeon: Raford Riggs, MD;  Location: [facility] INVASIVE CV LAB;  Service: Cardiovascular;  Laterality: N/A;   Patient Active Problem List   Diagnosis Date Noted   Persistent atrial fibrillation (HCC) 10/01/2023   PAF (paroxysmal atrial fibrillation) (HCC) 03/22/2023   Obesity, class 3 03/13/2023   Hypothyroid 03/13/2023   Chronic heart failure with preserved ejection fraction (HFpEF) (HCC) 03/13/2023   Chronic HFrEF (heart failure with reduced ejection fraction) (HCC) 03/13/2023   Acute  respiratory failure with hypoxia (HCC) 03/10/2023   Acute hypoxemic respiratory failure (HCC) 03/10/2023   Hypokalemia 03/09/2023   Atrial fibrillation with RVR (HCC) 03/08/2023   Acute on chronic systolic CHF (congestive heart failure) (HCC) 03/08/2023   Iatrogenic hyperthyroidism 03/08/2023   Urinary incontinence, mixed 01/07/2023   Vaginal erosion secondary to pessary use (HCC) 01/07/2023   Female genital prolapse 01/07/2023   Vaginal atrophy 01/07/2023   History of postmenopausal bleeding 01/07/2023   Nocturia 01/07/2023   Constipation 01/07/2023   Class 3 severe obesity with serious comorbidity and body mass index (BMI) of 45.0 to 49.9 in adult 11/27/2021   Vitamin D  deficiency 06/07/2021   Insulin  resistance 06/07/2021   OSA (obstructive sleep apnea) 10/16/2013   Dyslipidemia 10/16/2013    Obesity 10/16/2013   Essential hypertension, benign 10/16/2013   Fitting and adjustment of pessary 10/16/2013    PCP: Virginia  Cleotilde PA  REFERRING PROVIDER: Catherene Toribio Pac, MD   REFERRING DIAG: M17.0 (ICD-10-CM) - Bilateral primary osteoarthritis of knee   THERAPY DIAG:  Difficulty in walking, not elsewhere classified  Muscle weakness (generalized)  Bilateral chronic knee pain  Rationale for Evaluation and Treatment: Rehabilitation  ONSET DATE: chronic  SUBJECTIVE:   SUBJECTIVE STATEMENT: Pt reports her energy level is low-I didn't sleep enough. Pt was recently hospitalized after seeing cardiologist 2 weeks ago. MD wanted to try a new medicine to help stabilize her heart and she was monitored in hospital for 4 days with EKG's and blood work every 2 hours. Pt was released this past Friday afternoon and cleared for PT. States she worked on walking with a walker while in the hospital. Sees cardiologist again this Friday for f/u. Knees hurt on/off. Feels safer using walker than the 2 canes she was using previously.   POOL ACCESS: currently none.  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 Left knee> pain than right. 10 years ago left ankle reconstruction  Fell off of ladder ~ 93yrs ago and fx left femur.  Left knee goes out and I fall  PERTINENT HISTORY: Left femur fx 15 yrs ago Ankle arthrotomy 10 yrs ago OA CHF PAIN:  Are you having pain? Yes: NPRS scale: current 2/10 Pain location: bil knee Pain description: ache Aggravating factors: standing 4 minutes; walk <5 min Relieving factors: no OTC drugs due to bleeding risks, ice, resting, sitting  PRECAUTIONS: None  RED FLAGS: None   WEIGHT BEARING RESTRICTIONS: No  FALLS:  Has patient fallen in last 6 months? Yes. Number of falls 1  LIVING ENVIRONMENT: Lives with: lives with their family Lives in: House/apartment Stairs: Yes: Internal: 1  flight not used much to garage 5 steps  steps; on right going up Has following equipment at home: Single point cane, Wheelchair (manual), and transport chair  OCCUPATION: retired  PLOF: Ind with equipment  PATIENT GOALS: decrease pain,maintain mobility  NEXT MD VISIT: next month  OBJECTIVE:  Note: Objective measures were completed at Evaluation unless otherwise noted.  DIAGNOSTIC FINDINGS: OA bilat knees. L>R  PATIENT SURVEYS:  LEFS   Extreme difficulty/unable (0), Quite a bit of difficulty (1), Moderate difficulty (2), Little difficulty (3), No difficulty (4) Survey date:   8/8  Any of your usual work, housework or school activities 2   2. Usual hobbies, recreational or sporting activities 3   3. Getting into/out of the bath 2   4. Walking between rooms 2   5. Putting on socks/shoes  3   6. Squatting  0   7. Lifting an object, like a bag of groceries from the floor 3   8. Performing light activities around your home 3   9. Performing heavy activities around your home 1   10. Getting into/out of a car 3   11. Walking 2 blocks 0   12. Walking 1 mile 0   13. Going up/down 10 stairs (1 flight) 1   14. Standing for 1 hour 0   15.  sitting for 1 hour 4   16. Running on even ground 0   17. Running on uneven ground 0   18. Making sharp turns while running fast 0   19. Hopping  1   20. Rolling over in bed 2   Score total:  30/80 25/80     COGNITION: Overall cognitive status: Within functional limits for tasks assessed     SENSATION: WFL   MUSCLE LENGTH: Hamstrings: tested in sitting slightly tight   POSTURE: rounded shoulders and left knee varus deformity  PALPATION: Minor TTP along joint lines bilateral knees  LOWER EXTREMITY ROM:  Active ROM Right eval Left eval  Hip flexion    Hip extension    Hip abduction    Hip adduction    Hip internal rotation    Hip external rotation    Knee flexion 110d 100d  Knee extension 0 0  Ankle dorsiflexion    Ankle  plantarflexion    Ankle inversion    Ankle eversion     (Blank rows = not tested)  LOWER EXTREMITY MMT:  MMT Right eval Left eval  Hip flexion 32.9 43.3  Hip extension    Hip abduction    Hip adduction    Hip internal rotation    Hip external rotation    Knee flexion    Knee extension 40.2 48.5  Ankle dorsiflexion    Ankle plantarflexion    Ankle inversion    Ankle eversion     (Blank rows = not tested)    FUNCTIONAL TESTS:  Timed up and go (TUG): uable to tolerate without canes  STS from armed wc: heavy ue support Initial balance with cga  Balance FT: cl supervision  GAIT: Distance walked: 20 ft without ad Assistive device utilized: 2 canes (did not bring with her today) Level of assistance: CGA Comments: Antalgic gait:Offloading R and L, minimal knee flex during swing, hip circumduction to advance le                                                                                                                                TREATMENT  [facility] Adult PT   10/09/23  --Nu step x26min L4 (seat 10) -gait with FWW x149ft -Roller to bil quad seated EOB -Seated marching 2x10 -seated fig 4 stretch and piriformis stretch L LE -seated LAQ x20ea -seated adductor squeeze 5 2x10 Seated clam GTB x30 -standing HR x10 (some  popping in R knee) -HEP handout and review    Treatment:                                              Date: 09/27/23 Pt seen for aquatic therapy today.  Treatment took place in water 3.5-4.75 ft in depth at the [facility] pool. Temp of water was 91.  Pt entered the pool via stairs in side-step motion leading with RLE with BUE on rail, exited the pool stairs in step-to pattern  - Unsupported:  forward/backward in 4 ft, 2 laps of each - side stepping with arm add/abdct with rainbow hand floats x 3 laps - UE support barbells standing 4 ft depth:  hip add/abdct x 20 each; hamstring curls alternating x 10 each; hip flexion/ extension x 20  each. - STS at bench in water with blue step under feet x 10; adductor ball squeeze with weighted ball, 10sec x 10 -Straddling noodle without UE support: cycling  09/25/23 -gait with FWW x75ft -Roller to L quad -SAQ bilateral- alternating x24min -PROM bil knees (minimal) -Partial bridge with focus on glute activation-LES on red physioball x6 -DKTC with red physioball x1.65min -Seated marching 2x10 -seated LAQ x5 no weight, x10ea 3# on R k -seated adductor squeeze 5 2x10 -Nu step x40min L2 (seat 10)   09/20/23 -gait with FWW x41ft -Roller to L quad -SAQ bilateral- alternating x75min -PROM bil knees (minimal) -Partial bridge with focus on glute activation-LES on red physioball x6 -DKTC with red physioball x1.3min -Seated marching 2x10 -seated HR/TR 2x10 -seated LAQ x5 no weight, x10ea 3# -seated adductor squeeze 5 2x10 -Nu step x14min L2 (seat 10)  Date: 09/18/23 Pt seen for aquatic therapy today.  Treatment took place in water 3.5-4.75 ft in depth at the [facility] pool. Temp of water was 91.  Pt entered the pool via stairs in side-step motion leading with RLE with BUE on rail, exited the pool via lift chair.  - Unsupported:  forward/backward and side stepping 3.7 ft - ue support barbells standing 3.8 ft: toe raises; heel raises (left ankle fused); HS curls; hip add/abd; hip extension.   (No burning in knees with exercise today) -4.3 in: tandem stance leading L then right ue support YHB x 20s   --SLS as above x 20s each.  -Noodle pull down yellow wide stance then staggered stances x 10,  3.8 ft -cycling on noodle across pool ue support HB -Straddling noodle ue support corner wall: hip add/abd;  hip flex/extension (good quad stretch) -HS and gastroc stretching at step 20s hold x 3 ea LE   Pt requires the buoyancy and hydrostatic pressure of water for support, and to offload joints by unweighting joint load by at least 50 % in navel deep water and by at least 75-80% in  chest to neck deep water.  Viscosity of the water is needed for resistance of strengthening. Water current perturbations provides challenge to standing balance requiring increased core activation.       PATIENT EDUCATION:  Education details: intro to aquatic therapy  Person educated: Patient and Spouse Education method: Explanation Education comprehension: verbalized understanding  HOME EXERCISE PROGRAM: TAccess Code: [email or web address] URL: [email or web address] Date: 10/09/2023 Prepared by: Asberry Rodes    ASSESSMENT:  CLINICAL IMPRESSION:  Monitored for signs dizziness and SOB throughout session with no incident of this. Able to complete nu-step  without knee discomfort. Trialed standing HR which did cause R knee popping and discomfort, so stopped. No c/o pain with seated exercises, though greater difficulty with L LAQ. Pt c/o mild L hip discomfort with seated marching which was relieved by seated hip stretching. Some fatigue noted after walking 134ft with FWW.  Provided pt with HEP to begin working on at home within pain limits. Will continue to progress as tolerated. Will re-assess next visit as POC ends 8/22.        Initial impression:  Patient is a 78 y.o. f who was seen today for physical therapy evaluation and treatment for oa bilat knees. She presents with husband who assisted her in [facility] to setting.  Pt reports pain in bilateral knees L>R and is wanting TKR but due to a cardiac illness earlier this year is not a candidate for another 11 months.  Her pain sensitivity is high.  She is unable to take pain meds nor anti-inflammatories due to cardiac issue. Exam demonstrates fairly good strength in quads.  Her fall risk is high as demonstrated through difficulty with immediate standing balance, requiring assistance to gain FT position and heavy ue assistance with all transitional movements..  She reports use of bilateral cane with amb in home if not furniture surfing. She  did not bring her canes and is unable to tolerate completing other functional tests. Pt has had  recent fall due to left knee giving out.  She is a good candidate for skilled PT both land and aquatic based.  Plan to use the properties of water initially to initiate movement and encourage muscle length, improve stability/balance and manage pain then transition onto land for increased loading to build strength  OBJECTIVE IMPAIRMENTS: Abnormal gait, decreased activity tolerance, decreased balance, decreased mobility, difficulty walking, decreased ROM, decreased strength, postural dysfunction, obesity, and pain.   ACTIVITY LIMITATIONS: carrying, lifting, bending, sitting, standing, squatting, sleeping, stairs, transfers, and locomotion level  PARTICIPATION LIMITATIONS: meal prep, cleaning, driving, shopping, community activity, and yard work  PERSONAL FACTORS: Time since onset of injury/illness/exacerbation and 1-2 comorbidities: see Pmhx are also affecting patient's functional outcome.   REHAB POTENTIAL: Good  CLINICAL DECISION MAKING: Evolving/moderate complexity  EVALUATION COMPLEXITY: Moderate   GOALS: Goals reviewed with patient? Yes  SHORT TERM GOALS: Target date: 09/13/23  Pt will tolerate full aquatic sessions consistently without increase in pain and with improving function to demonstrate good toleration and effectiveness of intervention.  Baseline: Goal status: Met 09/11/23  2.  Pt will negotiate steps entering and exiting pool Baseline:  Goal status: MET  -09/27/23  3.  Pt will have information and make decisions on accessing pool in future for management of condtion Baseline:  Goal status:  Met 09/19/23    LONG TERM GOALS: Target date: 10/11/23  Pt to improve on LEFS by at least 9 point to demonstrate statistically significant Improvement in function. Baseline:see above  Goal status: in progress - 09/27/23  2.  Pt will be indep with final HEP's (land and aquatic as  appropriate) for continued management of condition Baseline:  Goal status: INITIAL  3.  Pt will tolerate walking 10 consecutive minutes to demonstrate improved toleration to amb Baseline:  Goal status: INITIAL  4.  Pt will tolerate walking to or from setting (500 ft)  using approp AD and engaging in aquatic therapy session without excessive fatigue or increase in pain to demonstrate improved toleration to activity.  Baseline:  Goal status: INITIAL  5.  Pt will report decrease  in pain by at least 50% for improved toleration to activity/quality of life and to demonstrate improved management of pain. Baseline:  Goal status: INITIAL  6.  Pt will report improvement in functional mobility and ADL's at home Baseline: wants to at least maintain Goal status: INITIAL   PLAN:  PT FREQUENCY: 2x/week  PT DURATION: 8 weeks  PLANNED INTERVENTIONS: 97164- PT Re-evaluation, 97750- Physical Performance Testing, 97110-Therapeutic exercises, 97530- Therapeutic activity, 97112- Neuromuscular re-education, 97535- Self Care, [id number]- Manual therapy, [phone]- Gait training, [phone]- Orthotic Initial, [phone]- Aquatic Therapy, [phone]- Electrical stimulation (unattended), [phone]- Ionotophoresis 4mg /ml Dexamethasone , 79439 (1-2 muscles), 20561 (3+ muscles)- Dry Needling, Patient/Family education, Balance training, Stair training, Taping, Joint mobilization, DME instructions, Cryotherapy, and Moist heat  Approved codes (insurance auth): therapeutic exercise, neuro -re ed; gait training; manual therapy; therapeutic activity; self care; performance testing; aquatic therapy; mechanical traction; US ; orthotic fit; vaso; PT re-eval.   PLAN FOR NEXT SESSION: aquatic for first 3-4 weeks tthe alternate with land   Asberry Rodes, PTA  10/09/23 11:09 AM [facility] [address] [place], [place], [address] Phone: [phone]   Fax:  [phone]     Date of referral:  06/04/23 Referring provider:   Catherene Toribio Pac, MD   Referring diagnosis? M17.0 (ICD-10-CM) - Bilateral primary osteoarthritis of knee  Treatment diagnosis? (if different than referring diagnosis) no  What was this (referring dx) caused by? Arthritis  Nature of Condition: Chronic (continuous duration > 3 months)   Laterality: Both  Current Functional Measure Score: LEFS 30/80  Objective measurements identify impairments when they are compared to normal values, the uninvolved extremity, and prior level of function.  [x]  Yes  []  No  Objective assessment of functional ability: Moderate functional limitations   Briefly describe symptoms: chronic knee pain due to OA  How did symptoms start: chronic pain  Average pain intensity:  Last 24 hours: 4/10  Past week: 4/10  How often does the pt experience symptoms? Constantly  How much have the symptoms interfered with usual daily activities? Quite a bit  How has condition changed since care began at this facility? NA - initial visit  In general, how is the patients overall health? Good   BACK PAIN (STarT Back Screening Tool) No

## 2023-10-10 DIAGNOSIS — G4733 Obstructive sleep apnea (adult) (pediatric): Secondary | ICD-10-CM | POA: Diagnosis not present

## 2023-10-10 DIAGNOSIS — F5101 Primary insomnia: Secondary | ICD-10-CM | POA: Diagnosis not present

## 2023-10-10 DIAGNOSIS — I5022 Chronic systolic (congestive) heart failure: Secondary | ICD-10-CM | POA: Diagnosis not present

## 2023-10-10 DIAGNOSIS — I480 Paroxysmal atrial fibrillation: Secondary | ICD-10-CM | POA: Diagnosis not present

## 2023-10-11 ENCOUNTER — Ambulatory Visit (HOSPITAL_BASED_OUTPATIENT_CLINIC_OR_DEPARTMENT_OTHER): Admitting: Physical Therapy

## 2023-10-11 ENCOUNTER — Ambulatory Visit (HOSPITAL_COMMUNITY)
Admit: 2023-10-11 | Discharge: 2023-10-11 | Disposition: A | Discharge status: Home / Self Care | Attending: Internal Medicine | Admitting: Internal Medicine

## 2023-10-11 VITALS — BP 130/76 | HR 65 | Ht 66.0 in | Wt 279.5 lb

## 2023-10-11 DIAGNOSIS — D6869 Other thrombophilia: Secondary | ICD-10-CM | POA: Diagnosis not present

## 2023-10-11 DIAGNOSIS — Z5181 Encounter for therapeutic drug level monitoring: Secondary | ICD-10-CM | POA: Diagnosis not present

## 2023-10-11 DIAGNOSIS — I4819 Other persistent atrial fibrillation: Secondary | ICD-10-CM

## 2023-10-11 DIAGNOSIS — Z79899 Other long term (current) drug therapy: Secondary | ICD-10-CM | POA: Diagnosis not present

## 2023-10-11 DIAGNOSIS — I4891 Unspecified atrial fibrillation: Secondary | ICD-10-CM | POA: Diagnosis not present

## 2023-10-11 NOTE — Progress Notes (Signed)
 Primary Care Physician: Cleotilde, [place]  E, PA Primary Cardiologist: None Electrophysiologist: Eulas FORBES Furbish, MD  Referring Physician: Dr Furbish Avelina Ho Cassandra is a 78 y.o. female with a history of OSA, HLD, CHF, hyperthyroidism, atrial fibrillation who presents for follow up in the [facility].  In January 2025 she experienced a severe upper respiratory infection lasting three weeks, during which she felt too ill to seek medical attention. Eventually, she went to urgent care due to difficulty breathing and was found to be in atrial fibrillation, leading to a seven-day hospital stay. Amiodarone  was started. She was seen by Dr Furbish and recommended transitioning from amiodarone  to dofetilide  due to side effect profile. Patient is on Eliquis  for stroke prevention.    On follow up 10/11/23, patient is here for Tikosyn  surveillance. S/p Tikosyn  admission 8/12-15/2025. Patient did not require cardioversion. Qtc remained stable. No missed doses of Tikosyn  500 mcg BID or Eliquis  5 mg BID. Patient is doing well since hospital discharge.   Today, she denies symptoms of palpitations, chest pain, shortness of breath, orthopnea, PND, lower extremity edema, dizziness, presyncope, syncope, bleeding, or neurologic sequela. The patient is tolerating medications without difficulties and is otherwise without complaint today.    Atrial Fibrillation Risk Factors:  she does have symptoms or diagnosis of sleep apnea. she does not have a history of rheumatic fever.   Atrial Fibrillation Management history:  Previous antiarrhythmic drugs: amiodarone   Previous cardioversions: 03/14/23 Previous ablations: none Anticoagulation history: Eliquis   ROS- All systems are reviewed and negative except as per the HPI above.  Past Medical History:  Diagnosis Date   Allergic rhinitis    Arthritis    generalized-ankles and knees   BCC (basal cell carcinoma of skin)     Constipation    Dyslipidemia    Edema of both lower extremities    GERD (gastroesophageal reflux disease)    H/O: pituitary tumor    irradicated no problems now   Headache    migraines in past   High cholesterol    Hypertension    Hypothyroidism    Hypothyroidism (acquired)    Post-ablation   Joint pain    Morbid obesity (HCC)    OSA (obstructive sleep apnea)    intolerant to CPAP and now using an oral device   Osteoarthritis    Toxic solitary thyroid  nodule    radioactive iodine therapy with 31.4 mCi of I-131 on 12/05/2007   Transfusion history    with hip surgery    Current Outpatient Medications  Medication Sig Dispense Refill   acetaminophen  (TYLENOL ) 325 MG tablet Take 650 mg by mouth every 6 (six) hours as needed for moderate pain (pain score 4-6). (Patient taking differently: Take 650 mg by mouth as needed for moderate pain (pain score 4-6).)     apixaban  (ELIQUIS ) 5 MG TABS tablet Take 1 tablet (5 mg total) by mouth 2 (two) times daily. 60 tablet 5   Calcium  Carbonate-Vit D-Min (CALCIUM  1200 PO) Take 1 tablet by mouth every morning.     celecoxib  (CELEBREX ) 50 MG capsule Take 50 mg by mouth as needed for pain.     conjugated estrogens  (PREMARIN ) vaginal cream Place 1 Applicatorful vaginally 2 (two) times a week. Place 0.5g nightly for two weeks then twice a week after 30 g 2   dofetilide  (TIKOSYN ) 500 MCG capsule Take 1 capsule (500 mcg total) by mouth every 12 (twelve) hours. 60 capsule 6   DULoxetine  (CYMBALTA ) 60  MG capsule Take 1 capsule by mouth daily.     empagliflozin  (JARDIANCE ) 10 MG TABS tablet Take 1 tablet (10 mg total) by mouth daily. 90 tablet 3   famotidine  (PEPCID ) 20 MG tablet Take 20 mg by mouth daily as needed for heartburn or indigestion. (Patient taking differently: Take 20 mg by mouth as needed for heartburn or indigestion.)     furosemide  (LASIX ) 40 MG tablet Take 1 tablet (40 mg total) by mouth 2 (two) times daily. 90 tablet 3   levothyroxine   (SYNTHROID ) 125 MCG tablet Take 1 tablet (125 mcg total) by mouth daily before breakfast. 30 tablet 0   metoprolol  succinate (TOPROL -XL) 25 MG 24 hr tablet Take 1 tablet (25 mg total) by mouth 2 (two) times daily. 180 tablet 3   potassium chloride  SA (KLOR-CON  M) 20 MEQ tablet Take 1 tablet (20 mEq total) by mouth daily. 30 tablet 6   psyllium (HYDROCIL/METAMUCIL) 95 % PACK Take 1 packet by mouth daily.     rosuvastatin  (CRESTOR ) 20 MG tablet Take 1 tablet (20 mg total) by mouth daily. Please make overdue appt with Dr. Shlomo before anymore refills. 2nd attempt 15 tablet 0   sacubitril -valsartan  (ENTRESTO ) 49-51 MG Take 1 tablet by mouth 2 (two) times daily. 60 tablet 11   spironolactone  (ALDACTONE ) 25 MG tablet Take 1 tablet (25 mg total) by mouth daily. 90 tablet 3   zolpidem (AMBIEN) 5 MG tablet Take 5 mg by mouth at bedtime as needed for sleep.     No current facility-administered medications for this encounter.    Physical Exam: BP 130/76   Pulse 65   Ht 5' 6 (1.676 m)   Wt 126.8 kg   BMI 45.11 kg/m   GEN- The patient is well appearing, alert and oriented x 3 today.   Neck - no JVD or carotid bruit noted Lungs- Clear to ausculation bilaterally, normal work of breathing Heart- Regular rate and rhythm, no murmurs, rubs or gallops, PMI not laterally displaced Extremities- no clubbing, cyanosis, or edema Skin - no rash or ecchymosis noted   Wt Readings from Last 3 Encounters:  10/11/23 126.8 kg  10/01/23 129.7 kg  10/01/23 127.9 kg     EKG today demonstrates  Vent. rate 65 BPM PR interval 176 ms QRS duration 84 ms QT/QTcB 422/438 ms P-R-T axes 35 -37 52 Normal sinus rhythm Left axis deviation Cannot rule out Anterior infarct (cited on or before 07-Oct-2023) Abnormal ECG When compared with ECG of 07-Oct-2023 10:32, No significant change was found   Echo 03/10/23 demonstrated   1. Left ventricular ejection fraction, by estimation, is 30%. The left  ventricle has  moderately decreased function. The left ventricle  demonstrates global hypokinesis. Left ventricular diastolic function could  not be evaluated.   2. Right ventricular systolic function is normal. The right ventricular  size is normal.   3. The mitral valve was not well visualized. Mild mitral valve  regurgitation. No evidence of mitral stenosis.   4. The aortic valve was not well visualized. Aortic valve regurgitation  is not visualized.   5. The inferior vena cava is dilated in size with >50% respiratory  variability, suggesting right atrial pressure of 8 mmHg.     CHA2DS2-VASc Score = 5  The patient's score is based upon: CHF History: 1 HTN History: 1 Diabetes History: 0 Stroke History: 0 Vascular Disease History: 0 Age Score: 2 Gender Score: 1       ASSESSMENT AND PLAN: Persistent Atrial Fibrillation (  ICD10:  I48.19) The patient's CHA2DS2-VASc score is 5, indicating a 7.2% annual risk of stroke.    Patient is currently in NSR. Continue Toprol  25 mg BID.   High risk medication monitoring (ICD10: [id number]) Patient requires ongoing monitoring for anti-arrhythmic medication which has the potential to cause life threatening arrhythmias or AV block. Qtc stable. Continue Tikosyn  500 mcg BID. Bmet and mag drawn today.   Secondary Hypercoagulable State (ICD10:  D68.69) The patient is at significant risk for stroke/thromboembolism based upon her CHA2DS2-VASc Score of 5.  Continue Apixaban  (Eliquis ).  No missed doses.   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   Obesity Body mass index is 45.11 kg/m.  Encouraged lifestyle modification  OSA  Encouraged nightly CPAP    Follow up in 1 month for Tikosyn  surveillance as scheduled with Daphne Barrack, NP.     Dorn Heinrich, [facility] [address] [place], [place] [address] [phone]

## 2023-10-12 LAB — BASIC METABOLIC PANEL WITH GFR
BUN/Creatinine Ratio: 30 — ABNORMAL HIGH (ref 12–28)
BUN: 24 mg/dL (ref 8–27)
CO2: 24 mmol/L (ref 20–29)
Calcium: 9.9 mg/dL (ref 8.7–10.3)
Chloride: 100 mmol/L (ref 96–106)
Creatinine, Ser: 0.81 mg/dL (ref 0.57–1.00)
Glucose: 101 mg/dL — ABNORMAL HIGH (ref 70–99)
Potassium: 5.4 mmol/L — ABNORMAL HIGH (ref 3.5–5.2)
Sodium: 138 mmol/L (ref 134–144)
eGFR: 75 mL/min/1.73 (ref >59)

## 2023-10-12 LAB — MAGNESIUM: Magnesium: 2.3 mg/dL (ref 1.6–2.3)

## 2023-10-14 ENCOUNTER — Ambulatory Visit

## 2023-10-14 ENCOUNTER — Encounter (HOSPITAL_BASED_OUTPATIENT_CLINIC_OR_DEPARTMENT_OTHER): Payer: Self-pay | Admitting: Physical Therapy

## 2023-10-14 ENCOUNTER — Ambulatory Visit (HOSPITAL_COMMUNITY): Payer: Self-pay | Admitting: Internal Medicine

## 2023-10-14 ENCOUNTER — Ambulatory Visit (HOSPITAL_BASED_OUTPATIENT_CLINIC_OR_DEPARTMENT_OTHER): Payer: Self-pay | Admitting: Physical Therapy

## 2023-10-14 DIAGNOSIS — R262 Difficulty in walking, not elsewhere classified: Secondary | ICD-10-CM

## 2023-10-14 DIAGNOSIS — M6281 Muscle weakness (generalized): Secondary | ICD-10-CM

## 2023-10-14 DIAGNOSIS — I4891 Unspecified atrial fibrillation: Secondary | ICD-10-CM | POA: Diagnosis not present

## 2023-10-14 DIAGNOSIS — G8929 Other chronic pain: Secondary | ICD-10-CM | POA: Diagnosis not present

## 2023-10-14 DIAGNOSIS — M25561 Pain in right knee: Secondary | ICD-10-CM | POA: Diagnosis not present

## 2023-10-14 DIAGNOSIS — R2681 Unsteadiness on feet: Secondary | ICD-10-CM | POA: Diagnosis not present

## 2023-10-14 DIAGNOSIS — M25562 Pain in left knee: Secondary | ICD-10-CM | POA: Diagnosis not present

## 2023-10-14 NOTE — Therapy (Signed)
 OUTPATIENT PHYSICAL THERAPY LOWER EXTREMITY TREATment/pn  Progress Note Reporting Period 08/14/23 to 10/14/23  See note below for Objective Data and Assessment of Progress/Goals.       Patient Name: Cassandra Ho MRN: [id number] DOB:Dec 04, 1945, 78 y.o., female Today's Date: 10/14/2023  END OF SESSION:  PT End of Session - 10/14/23 1340     Visit Number 10    Number of Visits 17    Date for PT Re-Evaluation 12/13/23    Authorization Type [employer] mcr    Authorization Time Period 08/15/23-8/21/225    Authorization - Visit Number 10    Authorization - Number of Visits 12    Progress Note Due on Visit 20    PT Start Time 1315    PT Stop Time 1355    PT Time Calculation (min) 40 min    Activity Tolerance Patient tolerated treatment well    Behavior During Therapy WFL for tasks assessed/performed              Past Medical History:  Diagnosis Date   Allergic rhinitis    Arthritis    generalized-ankles and knees   BCC (basal cell carcinoma of skin)    Constipation    Dyslipidemia    Edema of both lower extremities    GERD (gastroesophageal reflux disease)    H/O: pituitary tumor    irradicated no problems now   Headache    migraines in past   High cholesterol    Hypertension    Hypothyroidism    Hypothyroidism (acquired)    Post-ablation   Joint pain    Morbid obesity (HCC)    OSA (obstructive sleep apnea)    intolerant to CPAP and now using an oral device   Osteoarthritis    Toxic solitary thyroid  nodule    radioactive iodine therapy with 31.4 mCi of I-131 on 12/05/2007   Transfusion history    with hip surgery   Past Surgical History:  Procedure Laterality Date   ANKLE ARTHROTOMY Left    ankle and foot reconstruction ;retained hardware   BREAST BIOPSY Bilateral    Many years ago, no scar seen    BREAST SURGERY     x2 biopsies(benign)   CARDIOVERSION N/A 03/14/2023   Procedure: CARDIOVERSION;  Surgeon: Raford Riggs, MD;  Location: [facility] INVASIVE  CV LAB;  Service: Cardiovascular;  Laterality: N/A;   CARPAL TUNNEL RELEASE Bilateral    COLONOSCOPY WITH PROPOFOL  N/A 06/15/2014   Procedure: COLONOSCOPY WITH PROPOFOL ;  Surgeon: Gladis MARLA Louder, MD;  Location: [facility] ENDOSCOPY;  Service: Endoscopy;  Laterality: N/A;   D&C  1977   DILATION AND CURETTAGE OF UTERUS  1973   SAB   DILATION AND CURETTAGE OF UTERUS  1974   DILATION AND CURETTAGE OF UTERUS  1975   DILATION AND CURETTAGE OF UTERUS  1978   DILATION AND CURETTAGE OF UTERUS  1991   HIP FRACTURE SURGERY     repaired with cader bone and metal plates   IUD REMOVAL  2000   hysteroscopy   KNEE ARTHROSCOPY Right    torn meniscus     TRANSESOPHAGEAL ECHOCARDIOGRAM (CATH LAB) N/A 03/14/2023   Procedure: TRANSESOPHAGEAL ECHOCARDIOGRAM;  Surgeon: Raford Riggs, MD;  Location: [facility] INVASIVE CV LAB;  Service: Cardiovascular;  Laterality: N/A;   Patient Active Problem List   Diagnosis Date Noted   Persistent atrial fibrillation (HCC) 10/01/2023   PAF (paroxysmal atrial fibrillation) (HCC) 03/22/2023   Obesity, class 3 03/13/2023   Hypothyroid 03/13/2023   Chronic  heart failure with preserved ejection fraction (HFpEF) (HCC) 03/13/2023   Chronic HFrEF (heart failure with reduced ejection fraction) (HCC) 03/13/2023   Acute respiratory failure with hypoxia (HCC) 03/10/2023   Acute hypoxemic respiratory failure (HCC) 03/10/2023   Hypokalemia 03/09/2023   Atrial fibrillation with RVR (HCC) 03/08/2023   Acute on chronic systolic CHF (congestive heart failure) (HCC) 03/08/2023   Iatrogenic hyperthyroidism 03/08/2023   Urinary incontinence, mixed 01/07/2023   Vaginal erosion secondary to pessary use (HCC) 01/07/2023   Female genital prolapse 01/07/2023   Vaginal atrophy 01/07/2023   History of postmenopausal bleeding 01/07/2023   Nocturia 01/07/2023   Constipation 01/07/2023   Class 3 severe obesity with serious comorbidity and body mass index (BMI) of 45.0 to 49.9 in adult 11/27/2021    Vitamin D  deficiency 06/07/2021   Insulin  resistance 06/07/2021   OSA (obstructive sleep apnea) 10/16/2013   Dyslipidemia 10/16/2013   Obesity 10/16/2013   Essential hypertension, benign 10/16/2013   Fitting and adjustment of pessary 10/16/2013    PCP: Virginia  Cleotilde PA  REFERRING PROVIDER: Catherene Toribio Pac, MD   REFERRING DIAG: M17.0 (ICD-10-CM) - Bilateral primary osteoarthritis of knee   THERAPY DIAG:  Difficulty in walking, not elsewhere classified  Muscle weakness (generalized)  Bilateral chronic knee pain  Unsteadiness on feet  Rationale for Evaluation and Treatment: Rehabilitation  ONSET DATE: chronic  SUBJECTIVE:   SUBJECTIVE STATEMENT: Pt reports she spent 4 days in hospital to trial a new cardiac med.  Feels pretty well today pain low 1-2/10.  Reports she has been told by family and friends that she is walking and moving much better  POOL ACCESS: currently none.  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 Left knee> pain than right. 10 years ago left ankle reconstruction  Fell off of ladder ~ 62yrs ago and fx left femur.  Left knee goes out and I fall  PERTINENT HISTORY: Left femur fx 15 yrs ago Ankle arthrotomy 10 yrs ago OA CHF PAIN:  Are you having pain? Yes: NPRS scale: current 2/10 Pain location: bil knee Pain description: ache Aggravating factors: standing 4 minutes; walk <5 min Relieving factors: no OTC drugs due to bleeding risks, ice, resting, sitting  PRECAUTIONS: None  RED FLAGS: None   WEIGHT BEARING RESTRICTIONS: No  FALLS:  Has patient fallen in last 6 months? Yes. Number of falls 1  LIVING ENVIRONMENT: Lives with: lives with their family Lives in: House/apartment Stairs: Yes: Internal: 1 flight not used much to garage 5 steps  steps; on right going up Has following equipment at home: Single point cane, Wheelchair (manual), and transport chair  OCCUPATION:  retired  PLOF: Ind with equipment  PATIENT GOALS: decrease pain,maintain mobility  NEXT MD VISIT: next month  OBJECTIVE:  Note: Objective measures were completed at Evaluation unless otherwise noted.  DIAGNOSTIC FINDINGS: OA bilat knees. L>R  PATIENT SURVEYS:  LEFS   Extreme difficulty/unable (0), Quite a bit of difficulty (1), Moderate difficulty (2), Little difficulty (3), No difficulty (4) Survey date:  eval 8/25  Any of your usual work, housework or school activities 2   2. Usual hobbies, recreational or sporting activities 3   3. Getting into/out of the bath 2   4. Walking between rooms 2   5. Putting on socks/shoes 3   6. Squatting  0   7. Lifting an object, like a bag of groceries from the floor 3   8. Performing light  activities around your home 3   9. Performing heavy activities around your home 1   10. Getting into/out of a car 3   11. Walking 2 blocks 0   12. Walking 1 mile 0   13. Going up/down 10 stairs (1 flight) 1   14. Standing for 1 hour 0   15.  sitting for 1 hour 4   16. Running on even ground 0   17. Running on uneven ground 0   18. Making sharp turns while running fast 0   19. Hopping  1   20. Rolling over in bed 2   Score total:  30/80 40/80     COGNITION: Overall cognitive status: Within functional limits for tasks assessed     SENSATION: WFL   MUSCLE LENGTH: Hamstrings: tested in sitting slightly tight   POSTURE: rounded shoulders and left knee varus deformity  PALPATION: Minor TTP along joint lines bilateral knees  LOWER EXTREMITY ROM:  Active ROM Right eval Left eval  Hip flexion    Hip extension    Hip abduction    Hip adduction    Hip internal rotation    Hip external rotation    Knee flexion 110d 100d  Knee extension 0 0  Ankle dorsiflexion    Ankle plantarflexion    Ankle inversion    Ankle eversion     (Blank rows = not tested)  LOWER EXTREMITY MMT:  MMT Right eval Left eval  Hip flexion 32.9 43.3  Hip  extension    Hip abduction    Hip adduction    Hip internal rotation    Hip external rotation    Knee flexion    Knee extension 40.2 48.5  Ankle dorsiflexion    Ankle plantarflexion    Ankle inversion    Ankle eversion     (Blank rows = not tested)    FUNCTIONAL TESTS:  Timed up and go (TUG): uable to tolerate without canes  STS from armed wc: heavy ue support Initial balance with cga  Balance FT: cl supervision  GAIT: Distance walked: 20 ft without ad Assistive device utilized: 2 canes (did not bring with her today) Level of assistance: CGA Comments: Antalgic gait:Offloading R and L, minimal knee flex during swing, hip circumduction to advance le                                                                                                                                TREATMENT  [facility] Adult PT   Date: 10/14/23 Self care: Progression of function; safety in home environment; use of AD, exercise frequency and duration going forward.  Increasing walking toleration   Pt seen for aquatic therapy today.  Treatment took place in water 3.5-4.75 ft in depth at the [facility] pool. Temp of water was 91.  Pt entered the pool via stairs in side-step motion leading with RLE with BUE on rail, exited the pool stairs  in step-to pattern   - Unsupported:  forward/backward in 4 ft - side stepping with arm add/abdct with rainbow hand floats  - UE support RB-yellow HB standing 4 ft depth:  hip add/abdct x 20 each; hamstring curls alternating x 10 each; hip flexion/ extension x 20 each; toe raises; heel raises x20; relaxed squats - Side stepping with slight lunge ue add/abd  -Straddling noodle without UE support: cycling   Pt requires the buoyancy and hydrostatic pressure of water for support, and to offload joints by unweighting joint load by at least 50 % in navel deep water and by at least 75-80% in chest to neck deep water.  Viscosity of the water is needed for resistance of  strengthening. Water current perturbations provides challenge to standing balance requiring increased core activation.    10/09/23  --Nu step x62min L4 (seat 10) -gait with FWW x161ft -Roller to bil quad seated EOB -Seated marching 2x10 -seated fig 4 stretch and piriformis stretch L LE -seated LAQ x20ea -seated adductor squeeze 5 2x10 Seated clam GTB x30 -standing HR x10 (some popping in R knee) -HEP handout and review    PATIENT EDUCATION:  Education details: intro to aquatic therapy  Person educated: Patient and Spouse Education method: Explanation Education comprehension: verbalized understanding  HOME EXERCISE PROGRAM: TAccess Code: [email or web address] URL: [email or web address] Date: 10/09/2023 Prepared by: Asberry Rodes  Aquatic This aquatic home exercise program from [employer] utilizes pictures from land based exercises, but has been adapted prior to lamination and issuance.   Access Code: [email or web address] URL: [email or web address] Date: 10/14/2023 Prepared by: Frankie Shinichi Anguiano  Exercises - Hand buoy carry: Forward and Backward; bilaterally->unilaterally  - 1 x daily - 7 x weekly - 3 sets - 10 reps - Leg swings flex/ext  - 1 x daily - 7 x weekly - 3 sets - 10 reps - Standing Hip Abduction Adduction at Pool Wall  - 1 x daily - 7 x weekly - 3 sets - 10 reps - Piriformis Stretch with Pressure at [employer]  - 1 x daily - 7 x weekly - 3 sets - 10 reps - Standing Alternating Knee Flexion  - 1 x daily - 7 x weekly - 3 sets - 10 reps - Sit to Stand  - 1 x daily - 7 x weekly - 3 sets - 10 reps - Cycling on noodle/noodle wrapped under shoulders  - 1 x daily - 7 x weekly - 3 sets - 10 reps - Heel Toe Raises at Pool Wall  - 1 x daily - 7 x weekly - 3 sets - 10 reps   ASSESSMENT:  CLINICAL IMPRESSION: PN :pt reports overal pain is at least 50% better then prior to PT intervention.  She is able to rise from chairs on first attempt.  Her LEFS score has increased by  10 point demonstrating a statistically significant improvement. She reports she has amb continuously for 10 minutes last week when she was in the hospital with only some minor fatigue. She is completing all of her ADL's in home without limitation albeit with some  discomfort. Majority of goals have been met.  She began transitioning to land based intervention a few weeks ago.  She has just about reached her max potential in aquatic session but will continue to benefit from land based intervention adding load for increased strengthening, endurance/toleration to activity and improve functional mobility. Pt to be seen x 1 more in aquatics for issuance of final aquatic HEP.     Initial  impression:  Patient is a 78 y.o. f who was seen today for physical therapy evaluation and treatment for oa bilat knees. She presents with husband who assisted her in [facility] to setting.  Pt reports pain in bilateral knees L>R and is wanting TKR but due to a cardiac illness earlier this year is not a candidate for another 11 months.  Her pain sensitivity is high.  She is unable to take pain meds nor anti-inflammatories due to cardiac issue. Exam demonstrates fairly good strength in quads.  Her fall risk is high as demonstrated through difficulty with immediate standing balance, requiring assistance to gain FT position and heavy ue assistance with all transitional movements..  She reports use of bilateral cane with amb in home if not furniture surfing. She did not bring her canes and is unable to tolerate completing other functional tests. Pt has had  recent fall due to left knee giving out.  She is a good candidate for skilled PT both land and aquatic based.  Plan to use the properties of water initially to initiate movement and encourage muscle length, improve stability/balance and manage pain then transition onto land for increased loading to build strength  OBJECTIVE IMPAIRMENTS: Abnormal gait, decreased activity tolerance,  decreased balance, decreased mobility, difficulty walking, decreased ROM, decreased strength, postural dysfunction, obesity, and pain.   ACTIVITY LIMITATIONS: carrying, lifting, bending, sitting, standing, squatting, sleeping, stairs, transfers, and locomotion level  PARTICIPATION LIMITATIONS: meal prep, cleaning, driving, shopping, community activity, and yard work  PERSONAL FACTORS: Time since onset of injury/illness/exacerbation and 1-2 comorbidities: see Pmhx are also affecting patient's functional outcome.   REHAB POTENTIAL: Good  CLINICAL DECISION MAKING: Evolving/moderate complexity  EVALUATION COMPLEXITY: Moderate   GOALS: Goals reviewed with patient? Yes  SHORT TERM GOALS: Target date: 09/13/23  Pt will tolerate full aquatic sessions consistently without increase in pain and with improving function to demonstrate good toleration and effectiveness of intervention.  Baseline: Goal status: Met 09/11/23  2.  Pt will negotiate steps entering and exiting pool Baseline:  Goal status: MET  -09/27/23  3.  Pt will have information and make decisions on accessing pool in future for management of condtion Baseline:  Goal status:  Met 09/19/23    LONG TERM GOALS: Target date: 10/11/23  Pt to improve on LEFS by at least 9 point to demonstrate statistically significant Improvement in function. Baseline:see above  Goal status: Met 10/14/23  2.  Pt will be indep with final HEP's (land and aquatic as appropriate) for continued management of condition Baseline:  Goal status: In progress 10/14/23  3.  Pt will tolerate walking 10 consecutive minutes to demonstrate improved toleration to amb Baseline:  Goal status: In progress 10/14/23  4.  Pt will tolerate walking to or from setting (500 ft)  using approp AD and engaging in aquatic therapy session without excessive fatigue or increase in pain to demonstrate improved toleration to activity.  Baseline:  Goal status: Met 10/14/23  5.  Pt  will report decrease in pain by at least 50% for improved toleration to activity/quality of life and to demonstrate improved management of pain. Baseline:  Goal status: Met 10/14/23  6.  Pt will report improvement in functional mobility and ADL's at home Baseline: wants to at least maintain Goal status: Met 10/14/23   7. Pt will amb community distances with AD resting as needed without excessive pain or fatigue.    Baseline: Max 500 ft    Goal Status: NEW    8.  Pt will score 35-40/56 on Berg balance scale to demonstrate a reduced fall risk in medium to low risk category     Baseline: untested    Goal status: New  PLAN:  PT FREQUENCY: 1 x week  PT DURATION: 8 weeks 6 more visits total.  PLANNED INTERVENTIONS: 02835- PT Re-evaluation, 97750- Physical Performance Testing, 97110-Therapeutic exercises, 97530- Therapeutic activity, 97112- Neuromuscular re-education, 97535- Self Care, 02859- Manual therapy, [id number]- Gait training, [phone]- Orthotic Initial, [phone]- Aquatic Therapy, [phone]- Electrical stimulation (unattended), [phone]- Ionotophoresis 4mg /ml Dexamethasone , 79439 (1-2 muscles), 20561 (3+ muscles)- Dry Needling, Patient/Family education, Balance training, Stair training, Taping, Joint mobilization, DME instructions, Cryotherapy, and Moist heat  Approved codes (insurance auth): therapeutic exercise, neuro -re ed; gait training; manual therapy; therapeutic activity; self care; performance testing; aquatic therapy; mechanical traction; US ; orthotic fit; vaso; PT re-eval.   PLAN FOR NEXT SESSION: instruct on final aquatic HEP, fully transition to land based intervention.   Ronal Winifred) Wiktoria Hemrick MPT 10/14/23 5:08 PM [facility] [address] [place], [place], [address] Phone: [phone]   Fax:  [phone]    Updated 10/14/23  Date of referral: 06/04/23 Referring provider:   Catherene Toribio Pac, MD   Referring diagnosis? M17.0  (ICD-10-CM) - Bilateral primary osteoarthritis of knee  Treatment diagnosis? (if different than referring diagnosis) no  What was this (referring dx) caused by? Arthritis  Nature of Condition: Chronic (continuous duration > 3 months)   Laterality: Both  Current Functional Measure Score: LEFS 40/80  Objective measurements identify impairments when they are compared to normal values, the uninvolved extremity, and prior level of function.  [x]  Yes  []  No  Objective assessment of functional ability: Moderate functional limitations   Briefly describe symptoms: chronic knee pain due to OA  How did symptoms start: chronic pain  Average pain intensity:  Last 24 hours: 2/10  Past week: 3.5/10  How often does the pt experience symptoms? Intermittent  How much have the symptoms interfered with usual daily activities? Moderate to little  How has condition changed since care began at this facility? better  In general, how is the patients overall health? Good   BACK PAIN (STarT Back Screening Tool) No

## 2023-10-15 ENCOUNTER — Other Ambulatory Visit (HOSPITAL_COMMUNITY): Payer: Self-pay | Admitting: *Deleted

## 2023-10-15 DIAGNOSIS — I4891 Unspecified atrial fibrillation: Secondary | ICD-10-CM

## 2023-10-15 LAB — CUP PACEART REMOTE DEVICE CHECK
Date Time Interrogation Session: 20250825143600
Implantable Pulse Generator Implant Date: 20250521
Pulse Gen Serial Number: 146174

## 2023-10-16 ENCOUNTER — Ambulatory Visit (HOSPITAL_BASED_OUTPATIENT_CLINIC_OR_DEPARTMENT_OTHER)

## 2023-10-16 ENCOUNTER — Encounter (HOSPITAL_BASED_OUTPATIENT_CLINIC_OR_DEPARTMENT_OTHER): Payer: Self-pay

## 2023-10-18 ENCOUNTER — Ambulatory Visit (HOSPITAL_BASED_OUTPATIENT_CLINIC_OR_DEPARTMENT_OTHER): Admitting: Physical Therapy

## 2023-10-23 ENCOUNTER — Encounter (HOSPITAL_COMMUNITY): Payer: Self-pay

## 2023-10-24 ENCOUNTER — Ambulatory Visit: Payer: Self-pay | Admitting: Cardiovascular Disease

## 2023-10-27 ENCOUNTER — Encounter (HOSPITAL_BASED_OUTPATIENT_CLINIC_OR_DEPARTMENT_OTHER): Admitting: Cardiology

## 2023-10-27 DIAGNOSIS — G4734 Idiopathic sleep related nonobstructive alveolar hypoventilation: Secondary | ICD-10-CM

## 2023-10-27 DIAGNOSIS — G4733 Obstructive sleep apnea (adult) (pediatric): Secondary | ICD-10-CM

## 2023-10-28 ENCOUNTER — Ambulatory Visit: Attending: Cardiology

## 2023-10-28 DIAGNOSIS — Z79899 Other long term (current) drug therapy: Secondary | ICD-10-CM

## 2023-10-28 DIAGNOSIS — G4733 Obstructive sleep apnea (adult) (pediatric): Secondary | ICD-10-CM

## 2023-10-28 DIAGNOSIS — I4891 Unspecified atrial fibrillation: Secondary | ICD-10-CM | POA: Diagnosis not present

## 2023-10-28 DIAGNOSIS — I5022 Chronic systolic (congestive) heart failure: Secondary | ICD-10-CM

## 2023-10-28 NOTE — Procedures (Signed)
    SLEEP STUDY REPORT Patient Information Study Date: 10/27/2023 Patient Name: Cassandra Ho Patient ID: [id number] Birth Date: [birth year] Age: 78 Gender: Female BMI: 45.7 (W=284 lb, H=5' 6'') Stopbang: 6 Referring Physician: Ezra Shuck, MD  TEST DESCRIPTION: Home sleep apnea testing was completed using the WatchPat, a Type 1 device, utilizing peripheral arterial tonometry (PAT), chest movement, actigraphy, pulse oximetry, pulse rate, body position and snore. AHI was calculated with apnea and hypopnea using valid sleep time as the denominator. RDI includes apneas, hypopneas, and RERAs. The data acquired and the scoring of sleep and all associated events were performed in accordance with the recommended standards and specifications as outlined in the AASM Manual for the Scoring of Sleep and Associated Events 2.2.0 (2015).  FINDINGS: 1. No evidence of Obstructive Sleep Apnea with AHI 4.5/hr. 2. No Central Sleep Apnea. 3. Oxygen desaturations as low as 81%. 4. Minimal snoring was present. O2 sats were < 88% for 24.37minutes. 5. Total sleep time was 5 hrs and 44 min. 6. 10.3% of total sleep time was spent in REM sleep. 7. Normal sleep onset latency at 15 min. 8. Prolonged REM sleep onset latency at 240 min. 9. Total awakenings were 9.  DIAGNOSIS: Normal study for OSA with no significant sleep disordered breathing. Nocturnal Hypoxemia  RECOMMENDATIONS: 1. Normal study with no significant sleep disordered breathing. 2. Healthy sleep recommendations include: adequate nightly sleep (normal 7-9 hrs/night), avoidance of caffeine after noon and alcohol near bedtime, and maintaining a sleep environment that is cool, dark and quiet. 3. Weight loss for overweight patients is recommended. 4. Snoring recommendations include: weight loss where appropriate, side sleeping, and avoidance of alcohol before bed. 5. Operation of motor vehicle or dangerous equipment must be avoided when  feeling drowsy, excessively sleepy, or mentally fatigued. 6. An ENT consultation which may be useful for specific causes of and possible treatment of bothersome snoring . 7. Weight loss may be of benefit in reducing the severity of snoring.  8. Recommend in lab PSG given her significant nocturnal hypoxemia  Signature: Wilbert Bihari, MD; [facility]; Diplomat, American Board of Sleep Medicine Electronically Signed: 10/28/2023 4:40:15 PM

## 2023-10-29 ENCOUNTER — Ambulatory Visit (HOSPITAL_COMMUNITY): Payer: Self-pay | Admitting: Internal Medicine

## 2023-10-29 ENCOUNTER — Ambulatory Visit (HOSPITAL_BASED_OUTPATIENT_CLINIC_OR_DEPARTMENT_OTHER): Attending: Pain Medicine | Admitting: Physical Therapy

## 2023-10-29 ENCOUNTER — Encounter (HOSPITAL_BASED_OUTPATIENT_CLINIC_OR_DEPARTMENT_OTHER): Payer: Self-pay | Admitting: Physical Therapy

## 2023-10-29 ENCOUNTER — Encounter (HOSPITAL_COMMUNITY): Payer: Self-pay

## 2023-10-29 DIAGNOSIS — R262 Difficulty in walking, not elsewhere classified: Secondary | ICD-10-CM | POA: Diagnosis not present

## 2023-10-29 DIAGNOSIS — M25562 Pain in left knee: Secondary | ICD-10-CM | POA: Insufficient documentation

## 2023-10-29 DIAGNOSIS — R2681 Unsteadiness on feet: Secondary | ICD-10-CM | POA: Diagnosis not present

## 2023-10-29 DIAGNOSIS — M25561 Pain in right knee: Secondary | ICD-10-CM | POA: Insufficient documentation

## 2023-10-29 DIAGNOSIS — M6281 Muscle weakness (generalized): Secondary | ICD-10-CM | POA: Insufficient documentation

## 2023-10-29 DIAGNOSIS — G8929 Other chronic pain: Secondary | ICD-10-CM | POA: Diagnosis not present

## 2023-10-29 LAB — POTASSIUM: Potassium: 4.4 mmol/L (ref 3.5–5.2)

## 2023-10-29 NOTE — Therapy (Signed)
 OUTPATIENT PHYSICAL THERAPY LOWER EXTREMITY TREATMENT       Patient Name: Cassandra Ho MRN: [id number] DOB:02-14-1946, 78 y.o., female Today's Date: 10/29/2023  END OF SESSION:  PT End of Session - 10/29/23 1452     Visit Number 11    Number of Visits 17    Date for PT Re-Evaluation 12/13/23    Authorization Type [employer] mcr    Authorization Time Period 08/15/23-8/21/225    PT Start Time 1433    PT Stop Time 1512    PT Time Calculation (min) 39 min    Activity Tolerance Patient tolerated treatment well    Behavior During Therapy WFL for tasks assessed/performed               Past Medical History:  Diagnosis Date   Allergic rhinitis    Arthritis    generalized-ankles and knees   BCC (basal cell carcinoma of skin)    Constipation    Dyslipidemia    Edema of both lower extremities    GERD (gastroesophageal reflux disease)    H/O: pituitary tumor    irradicated no problems now   Headache    migraines in past   High cholesterol    Hypertension    Hypothyroidism    Hypothyroidism (acquired)    Post-ablation   Joint pain    Morbid obesity (HCC)    OSA (obstructive sleep apnea)    intolerant to CPAP and now using an oral device   Osteoarthritis    Toxic solitary thyroid  nodule    radioactive iodine therapy with 31.4 mCi of I-131 on 12/05/2007   Transfusion history    with hip surgery   Past Surgical History:  Procedure Laterality Date   ANKLE ARTHROTOMY Left    ankle and foot reconstruction ;retained hardware   BREAST BIOPSY Bilateral    Many years ago, no scar seen    BREAST SURGERY     x2 biopsies(benign)   CARDIOVERSION N/A 03/14/2023   Procedure: CARDIOVERSION;  Surgeon: Raford Riggs, MD;  Location: [facility] INVASIVE CV LAB;  Service: Cardiovascular;  Laterality: N/A;   CARPAL TUNNEL RELEASE Bilateral    COLONOSCOPY WITH PROPOFOL  N/A 06/15/2014   Procedure: COLONOSCOPY WITH PROPOFOL ;  Surgeon: Gladis MARLA Louder, MD;  Location: [facility] ENDOSCOPY;   Service: Endoscopy;  Laterality: N/A;   D&C  1977   DILATION AND CURETTAGE OF UTERUS  1973   SAB   DILATION AND CURETTAGE OF UTERUS  1974   DILATION AND CURETTAGE OF UTERUS  1975   DILATION AND CURETTAGE OF UTERUS  1978   DILATION AND CURETTAGE OF UTERUS  1991   HIP FRACTURE SURGERY     repaired with cader bone and metal plates   IUD REMOVAL  2000   hysteroscopy   KNEE ARTHROSCOPY Right    torn meniscus     TRANSESOPHAGEAL ECHOCARDIOGRAM (CATH LAB) N/A 03/14/2023   Procedure: TRANSESOPHAGEAL ECHOCARDIOGRAM;  Surgeon: Raford Riggs, MD;  Location: [facility] INVASIVE CV LAB;  Service: Cardiovascular;  Laterality: N/A;   Patient Active Problem List   Diagnosis Date Noted   Persistent atrial fibrillation (HCC) 10/01/2023   PAF (paroxysmal atrial fibrillation) (HCC) 03/22/2023   Obesity, class 3 03/13/2023   Hypothyroid 03/13/2023   Chronic heart failure with preserved ejection fraction (HFpEF) (HCC) 03/13/2023   Chronic HFrEF (heart failure with reduced ejection fraction) (HCC) 03/13/2023   Acute respiratory failure with hypoxia (HCC) 03/10/2023   Acute hypoxemic respiratory failure (HCC) 03/10/2023   Hypokalemia 03/09/2023  Atrial fibrillation with RVR (HCC) 03/08/2023   Acute on chronic systolic CHF (congestive heart failure) (HCC) 03/08/2023   Iatrogenic hyperthyroidism 03/08/2023   Urinary incontinence, mixed 01/07/2023   Vaginal erosion secondary to pessary use (HCC) 01/07/2023   Female genital prolapse 01/07/2023   Vaginal atrophy 01/07/2023   History of postmenopausal bleeding 01/07/2023   Nocturia 01/07/2023   Constipation 01/07/2023   Class 3 severe obesity with serious comorbidity and body mass index (BMI) of 45.0 to 49.9 in adult 11/27/2021   Vitamin D  deficiency 06/07/2021   Insulin  resistance 06/07/2021   OSA (obstructive sleep apnea) 10/16/2013   Dyslipidemia 10/16/2013   Obesity 10/16/2013   Essential hypertension, benign 10/16/2013   Fitting and adjustment of  pessary 10/16/2013    PCP: Virginia  Cleotilde PA  REFERRING PROVIDER: Catherene Toribio Pac, MD   REFERRING DIAG: M17.0 (ICD-10-CM) - Bilateral primary osteoarthritis of knee   THERAPY DIAG:  Difficulty in walking, not elsewhere classified  Muscle weakness (generalized)  Bilateral chronic knee pain  Unsteadiness on feet  Rationale for Evaluation and Treatment: Rehabilitation  ONSET DATE: chronic  SUBJECTIVE:   SUBJECTIVE STATEMENT:  Doing OK but knees decided to have a bad day from the past 10 minutes, had a big pop when I was going down the steps. I'm super tired today too. Did a sleep apnea study recently. Still having problems getting out of bed in the morning. L leg tends to pull up even 3 inches in the morning, it was worse than its usual this morning.    POOL ACCESS: currently none.  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 Left knee> pain than right. 10 years ago left ankle reconstruction  Fell off of ladder ~ 29yrs ago and fx left femur.  Left knee goes out and I fall  PERTINENT HISTORY: Left femur fx 15 yrs ago Ankle arthrotomy 10 yrs ago OA CHF PAIN:  Are you having pain? Yes: NPRS scale: 7 L knee, 4 R knee /10 Pain location: bil knee Pain description: aching and like someone is jabbing a nail file into my left knee  Aggravating factors: standing 4 minutes; walk <5 min, steps Relieving factors: no OTC drugs due to bleeding risks, ice, resting, sitting  PRECAUTIONS: None  RED FLAGS: None   WEIGHT BEARING RESTRICTIONS: No  FALLS:  Has patient fallen in last 6 months? Yes. Number of falls 1  LIVING ENVIRONMENT: Lives with: lives with their family Lives in: House/apartment Stairs: Yes: Internal: 1 flight not used much to garage 5 steps  steps; on right going up Has following equipment at home: Single point cane, Wheelchair (manual), and transport chair  OCCUPATION: retired  PLOF: Ind  with equipment  PATIENT GOALS: decrease pain,maintain mobility  NEXT MD VISIT: next month  OBJECTIVE:  Note: Objective measures were completed at Evaluation unless otherwise noted.  DIAGNOSTIC FINDINGS: OA bilat knees. L>R  PATIENT SURVEYS:  LEFS   Extreme difficulty/unable (0), Quite a bit of difficulty (1), Moderate difficulty (2), Little difficulty (3), No difficulty (4) Survey date:  eval 8/25  Any of your usual work, housework or school activities 2   2. Usual hobbies, recreational or sporting activities 3   3. Getting into/out of the bath 2   4. Walking between rooms 2   5. Putting on socks/shoes 3   6. Squatting  0   7. Lifting an object, like a bag of groceries from the floor 3  8. Performing light activities around your home 3   9. Performing heavy activities around your home 1   10. Getting into/out of a car 3   11. Walking 2 blocks 0   12. Walking 1 mile 0   13. Going up/down 10 stairs (1 flight) 1   14. Standing for 1 hour 0   15.  sitting for 1 hour 4   16. Running on even ground 0   17. Running on uneven ground 0   18. Making sharp turns while running fast 0   19. Hopping  1   20. Rolling over in bed 2   Score total:  30/80 40/80     COGNITION: Overall cognitive status: Within functional limits for tasks assessed     SENSATION: WFL   MUSCLE LENGTH: Hamstrings: tested in sitting slightly tight   POSTURE: rounded shoulders and left knee varus deformity  PALPATION: Minor TTP along joint lines bilateral knees  LOWER EXTREMITY ROM:  Active ROM Right eval Left eval  Hip flexion    Hip extension    Hip abduction    Hip adduction    Hip internal rotation    Hip external rotation    Knee flexion 110d 100d  Knee extension 0 0  Ankle dorsiflexion    Ankle plantarflexion    Ankle inversion    Ankle eversion     (Blank rows = not tested)  LOWER EXTREMITY MMT:  MMT Right eval Left eval  Hip flexion 32.9 43.3  Hip extension    Hip  abduction    Hip adduction    Hip internal rotation    Hip external rotation    Knee flexion    Knee extension 40.2 48.5  Ankle dorsiflexion    Ankle plantarflexion    Ankle inversion    Ankle eversion     (Blank rows = not tested)    FUNCTIONAL TESTS:  Timed up and go (TUG): uable to tolerate without canes  STS from armed wc: heavy ue support Initial balance with cga  Balance FT: cl supervision  GAIT: Distance walked: 20 ft without ad Assistive device utilized: 2 canes (did not bring with her today) Level of assistance: CGA Comments: Antalgic gait:Offloading R and L, minimal knee flex during swing, hip circumduction to advance le                                                                                                                                TREATMENT  [facility] Adult PT   10/29/23  Nustep L4x6 minutes all four extremities seat 11   Seated piriformis stretches 2x30 seconds B 3 way QL stretch x30 seconds each  Seated LAQ 2# x12 B Seated marches green TB alternating x20  HS curls 2# x12 B Seated clams green TB x30  Seated hip hikes x10 B      Date: 10/14/23 Self care: Progression of function; safety in home environment;  use of AD, exercise frequency and duration going forward.  Increasing walking toleration   Pt seen for aquatic therapy today.  Treatment took place in water 3.5-4.75 ft in depth at the [facility] pool. Temp of water was 91.  Pt entered the pool via stairs in side-step motion leading with RLE with BUE on rail, exited the pool stairs in step-to pattern   - Unsupported:  forward/backward in 4 ft - side stepping with arm add/abdct with rainbow hand floats  - UE support RB-yellow HB standing 4 ft depth:  hip add/abdct x 20 each; hamstring curls alternating x 10 each; hip flexion/ extension x 20 each; toe raises; heel raises x20; relaxed squats - Side stepping with slight lunge ue add/abd  -Straddling noodle without UE support:  cycling   Pt requires the buoyancy and hydrostatic pressure of water for support, and to offload joints by unweighting joint load by at least 50 % in navel deep water and by at least 75-80% in chest to neck deep water.  Viscosity of the water is needed for resistance of strengthening. Water current perturbations provides challenge to standing balance requiring increased core activation.    10/09/23  --Nu step x81min L4 (seat 10) -gait with FWW x133ft -Roller to bil quad seated EOB -Seated marching 2x10 -seated fig 4 stretch and piriformis stretch L LE -seated LAQ x20ea -seated adductor squeeze 5 2x10 Seated clam GTB x30 -standing HR x10 (some popping in R knee) -HEP handout and review    PATIENT EDUCATION:  Education details: intro to aquatic therapy  Person educated: Patient and Spouse Education method: Explanation Education comprehension: verbalized understanding  HOME EXERCISE PROGRAM: TAccess Code: [email or web address] URL: [email or web address] Date: 10/09/2023 Prepared by: Asberry Rodes  Aquatic This aquatic home exercise program from MedBridge utilizes pictures from land based exercises, but has been adapted prior to lamination and issuance.   Access Code: [email or web address] URL: [email or web address] Date: 10/14/2023 Prepared by: Frankie Ziemba  Exercises - Hand buoy carry: Forward and Backward; bilaterally->unilaterally  - 1 x daily - 7 x weekly - 3 sets - 10 reps - Leg swings flex/ext  - 1 x daily - 7 x weekly - 3 sets - 10 reps - Standing Hip Abduction Adduction at Pool Wall  - 1 x daily - 7 x weekly - 3 sets - 10 reps - Piriformis Stretch with Pressure at [employer]  - 1 x daily - 7 x weekly - 3 sets - 10 reps - Standing Alternating Knee Flexion  - 1 x daily - 7 x weekly - 3 sets - 10 reps - Sit to Stand  - 1 x daily - 7 x weekly - 3 sets - 10 reps - Cycling on noodle/noodle wrapped under shoulders  - 1 x daily - 7 x weekly - 3 sets - 10 reps - Heel  Toe Raises at Pool Wall  - 1 x daily - 7 x weekly - 3 sets - 10 reps   ASSESSMENT:  CLINICAL IMPRESSION:   Arrived today doing OK, knees were a bit more flared up than her usual today so we took it easy with gentle progressions of land exercises in sitting. Modified exercises as needed. Will plan on potential HEP update next visit.      Initial impression:  Patient is a 78 y.o. f who was seen today for physical therapy evaluation and treatment for oa bilat knees. She presents with husband who assisted her in [facility] to setting.  Pt reports pain in bilateral  knees L>R and is wanting TKR but due to a cardiac illness earlier this year is not a candidate for another 11 months.  Her pain sensitivity is high.  She is unable to take pain meds nor anti-inflammatories due to cardiac issue. Exam demonstrates fairly good strength in quads.  Her fall risk is high as demonstrated through difficulty with immediate standing balance, requiring assistance to gain FT position and heavy ue assistance with all transitional movements..  She reports use of bilateral cane with amb in home if not furniture surfing. She did not bring her canes and is unable to tolerate completing other functional tests. Pt has had  recent fall due to left knee giving out.  She is a good candidate for skilled PT both land and aquatic based.  Plan to use the properties of water initially to initiate movement and encourage muscle length, improve stability/balance and manage pain then transition onto land for increased loading to build strength  OBJECTIVE IMPAIRMENTS: Abnormal gait, decreased activity tolerance, decreased balance, decreased mobility, difficulty walking, decreased ROM, decreased strength, postural dysfunction, obesity, and pain.   ACTIVITY LIMITATIONS: carrying, lifting, bending, sitting, standing, squatting, sleeping, stairs, transfers, and locomotion level  PARTICIPATION LIMITATIONS: meal prep, cleaning, driving, shopping,  community activity, and yard work  PERSONAL FACTORS: Time since onset of injury/illness/exacerbation and 1-2 comorbidities: see Pmhx are also affecting patient's functional outcome.   REHAB POTENTIAL: Good  CLINICAL DECISION MAKING: Evolving/moderate complexity  EVALUATION COMPLEXITY: Moderate   GOALS: Goals reviewed with patient? Yes  SHORT TERM GOALS: Target date: 09/13/23  Pt will tolerate full aquatic sessions consistently without increase in pain and with improving function to demonstrate good toleration and effectiveness of intervention.  Baseline: Goal status: Met 09/11/23  2.  Pt will negotiate steps entering and exiting pool Baseline:  Goal status: MET  -09/27/23  3.  Pt will have information and make decisions on accessing pool in future for management of condtion Baseline:  Goal status:  Met 09/19/23    LONG TERM GOALS: Target date: 10/11/23  Pt to improve on LEFS by at least 9 point to demonstrate statistically significant Improvement in function. Baseline:see above  Goal status: Met 10/14/23  2.  Pt will be indep with final HEP's (land and aquatic as appropriate) for continued management of condition Baseline:  Goal status: In progress 10/14/23  3.  Pt will tolerate walking 10 consecutive minutes to demonstrate improved toleration to amb Baseline:  Goal status: In progress 10/14/23  4.  Pt will tolerate walking to or from setting (500 ft)  using approp AD and engaging in aquatic therapy session without excessive fatigue or increase in pain to demonstrate improved toleration to activity.  Baseline:  Goal status: Met 10/14/23  5.  Pt will report decrease in pain by at least 50% for improved toleration to activity/quality of life and to demonstrate improved management of pain. Baseline:  Goal status: Met 10/14/23  6.  Pt will report improvement in functional mobility and ADL's at home Baseline: wants to at least maintain Goal status: Met 10/14/23   7. Pt will amb  community distances with AD resting as needed without excessive pain or fatigue.    Baseline: Max 500 ft    Goal Status: NEW    8. Pt will score 35-40/56 on Berg balance scale to demonstrate a reduced fall risk in medium to low risk category     Baseline: untested    Goal status: New  PLAN:  PT FREQUENCY: 1 x  week  PT DURATION: 8 weeks 6 more visits total.  PLANNED INTERVENTIONS: 97164- PT Re-evaluation, 97750- Physical Performance Testing, 97110-Therapeutic exercises, 97530- Therapeutic activity, 97112- Neuromuscular re-education, 97535- Self Care, 02859- Manual therapy, [phone]- Gait training, [phone]- Orthotic Initial, [phone]- Aquatic Therapy, [phone]- Electrical stimulation (unattended), [phone]- Ionotophoresis 4mg /ml Dexamethasone , 79439 (1-2 muscles), 20561 (3+ muscles)- Dry Needling, Patient/Family education, Balance training, Stair training, Taping, Joint mobilization, DME instructions, Cryotherapy, and Moist heat  Approved codes (insurance auth): therapeutic exercise, neuro -re ed; gait training; manual therapy; therapeutic activity; self care; performance testing; aquatic therapy; mechanical traction; US ; orthotic fit; vaso; PT re-eval.   PLAN FOR NEXT SESSION: instruct on final aquatic HEP, fully transition to land based intervention. How is she feeling? Up session intensity if able    Josette Rough, PT, DPT 10/29/23 3:13 PM   Updated 10/14/23  Date of referral: 06/04/23 Referring provider:   Catherene Toribio Pac, MD   Referring diagnosis? M17.0 (ICD-10-CM) - Bilateral primary osteoarthritis of knee  Treatment diagnosis? (if different than referring diagnosis) no  What was this (referring dx) caused by? Arthritis  Nature of Condition: Chronic (continuous duration > 3 months)   Laterality: Both  Current Functional Measure Score: LEFS 40/80  Objective measurements identify impairments when they are compared to normal values, the uninvolved extremity, and prior level of  function.  [x]  Yes  []  No  Objective assessment of functional ability: Moderate functional limitations   Briefly describe symptoms: chronic knee pain due to OA  How did symptoms start: chronic pain  Average pain intensity:  Last 24 hours: 2/10  Past week: 3.5/10  How often does the pt experience symptoms? Intermittent  How much have the symptoms interfered with usual daily activities? Moderate to little  How has condition changed since care began at this facility? better  In general, how is the patients overall health? Good   BACK PAIN (STarT Back Screening Tool) No

## 2023-10-31 ENCOUNTER — Other Ambulatory Visit (HOSPITAL_BASED_OUTPATIENT_CLINIC_OR_DEPARTMENT_OTHER): Payer: Self-pay

## 2023-10-31 ENCOUNTER — Other Ambulatory Visit: Payer: Self-pay

## 2023-11-03 NOTE — Progress Notes (Signed)
  Electrophysiology Office Note:   Date:  11/04/2023  ID:  Cassandra Ho, DOB 1945-11-01, MRN [id number]  Primary Cardiologist: None Primary Heart Failure: None Electrophysiologist: Eulas FORBES Furbish, MD      History of Present Illness:   Cassandra Ho is a 78 y.o. female with h/o AF, HFrEF, HLD, OSA, hypoerthyroidism seen today for routine electrophysiology followup.   Since last being seen in our clinic the patient reports doing well. No evidence of AF. She has not missed doses of Tikosyn  (has multiple alarms set). She continues to feel tired but is not sure what it is attributed to - age, just slowing down, meds or something else.      She denies chest pain, palpitations, dyspnea, PND, orthopnea, nausea, vomiting, dizziness, syncope, edema, weight gain, or early satiety.   Review of systems complete and found to be negative unless listed in HPI.   EP Information / Studies Reviewed:    EKG is ordered today. Personal review as below.  EKG Interpretation Date/Time:  Monday November 04 2023 15:50:27 EDT Ventricular Rate:  67 PR Interval:  172 QRS Duration:  92 QT Interval:  452 QTC Calculation: 477 R Axis:   -29  Text Interpretation: Normal sinus rhythm Normal ECG Confirmed by Aniceto Jarvis ([id number]) on 11/04/2023 3:59:55 PM    Arrhythmia / AAD / Pertinent EP Studies AF DCCV 03/14/23 Tikosyn  09/2023 admit for loading   Device  [employer] ILR > implanted 07/10/23 for AF monitoring      Risk Assessment/Calculations:    CHA2DS2-VASc Score = 5   This indicates a 7.2% annual risk of stroke. The patient's score is based upon: CHF History: 1 HTN History: 1 Diabetes History: 0 Stroke History: 0 Vascular Disease History: 0 Age Score: 2 Gender Score: 1        STOP-Bang Score:  6       Physical Exam:   VS:  BP 118/60   Pulse 67   Ht 5' 6 (1.676 m)   Wt 279 lb (126.6 kg)   SpO2 97%   BMI 45.03 kg/m    Wt Readings from Last 3 Encounters:  11/04/23 279 lb  (126.6 kg)  10/11/23 279 lb 8 oz (126.8 kg)  10/01/23 286 lb (129.7 kg)     GEN: pleasant adult female, well nourished, well developed in no acute distress NECK: No JVD; No carotid bruits CARDIAC: Regular rate and rhythm, no murmurs, rubs, gallops RESPIRATORY:  Clear to auscultation without rales, wheezing or rhonchi  ABDOMEN: Soft, non-tender, non-distended EXTREMITIES:  difficult to assess edema; No deformity   ASSESSMENT AND PLAN:    Persistent Atrial Fibrillation  High Risk Drug Monitoring: Tikosyn   CHA2DS2-VASc 5  -OAC for stroke prophylaxis -EKG with NSR, QTc 477 ms -Tikosyn  500 mcg BID  -update labs > BMP, Mg+    -ILR review shows no AF post Tikosyn  loading   Secondary Hypercoagulable State  -continue Eliquis  5mg  BID, dose reviewed and appropriate by age / wt   HFrecEF  LVEF 54% on cMRI  -no evidence of overt edema / difficult to assess  -GDMT per AHF Team   OSA  -CPAP compliance encouraged     Follow up with Dr. Furbish / EP APP 4 months   Signed, Jarvis Aniceto, NP-C, AGACNP-BC [facility] - Electrophysiology  11/04/2023, 4:26 PM

## 2023-11-04 ENCOUNTER — Encounter: Payer: Self-pay | Admitting: Pulmonary Disease

## 2023-11-04 ENCOUNTER — Ambulatory Visit: Attending: Pulmonary Disease | Admitting: Pulmonary Disease

## 2023-11-04 VITALS — BP 118/60 | HR 67 | Ht 66.0 in | Wt 279.0 lb

## 2023-11-04 DIAGNOSIS — I4819 Other persistent atrial fibrillation: Secondary | ICD-10-CM | POA: Diagnosis not present

## 2023-11-04 DIAGNOSIS — G4733 Obstructive sleep apnea (adult) (pediatric): Secondary | ICD-10-CM | POA: Diagnosis not present

## 2023-11-04 DIAGNOSIS — Z79899 Other long term (current) drug therapy: Secondary | ICD-10-CM | POA: Diagnosis not present

## 2023-11-04 DIAGNOSIS — D6869 Other thrombophilia: Secondary | ICD-10-CM | POA: Diagnosis not present

## 2023-11-04 NOTE — Patient Instructions (Signed)
 Medication Instructions:  Your physician recommends that you continue on your current medications as directed. Please refer to the Current Medication list given to you today.  *If you need a refill on your cardiac medications before your next appointment, please call your pharmacy*  Lab Work: BMET, MAG-TODAY If you have labs (blood work) drawn today and your tests are completely normal, you will receive your results only by: MyChart Message (if you have MyChart) OR A paper copy in the mail If you have any lab test that is abnormal or we need to change your treatment, we will call you to review the results.  Follow-Up: At [facility], you and your health needs are our priority.  As part of our continuing mission to provide you with exceptional heart care, our providers are all part of one team.  This team includes your primary Cardiologist (physician) and Advanced Practice Providers or APPs (Physician Assistants and Nurse Practitioners) who all work together to provide you with the care you need, when you need it.  Your next appointment:   4 month(s)  Provider:   Daphne Barrack, NP

## 2023-11-05 ENCOUNTER — Encounter (HOSPITAL_BASED_OUTPATIENT_CLINIC_OR_DEPARTMENT_OTHER): Payer: Self-pay | Admitting: Physical Therapy

## 2023-11-05 ENCOUNTER — Ambulatory Visit (HOSPITAL_BASED_OUTPATIENT_CLINIC_OR_DEPARTMENT_OTHER): Admitting: Physical Therapy

## 2023-11-05 ENCOUNTER — Ambulatory Visit: Payer: Self-pay | Admitting: Pulmonary Disease

## 2023-11-05 DIAGNOSIS — G8929 Other chronic pain: Secondary | ICD-10-CM

## 2023-11-05 DIAGNOSIS — M6281 Muscle weakness (generalized): Secondary | ICD-10-CM | POA: Diagnosis not present

## 2023-11-05 DIAGNOSIS — R262 Difficulty in walking, not elsewhere classified: Secondary | ICD-10-CM | POA: Diagnosis not present

## 2023-11-05 DIAGNOSIS — R2681 Unsteadiness on feet: Secondary | ICD-10-CM

## 2023-11-05 DIAGNOSIS — M25562 Pain in left knee: Secondary | ICD-10-CM | POA: Diagnosis not present

## 2023-11-05 DIAGNOSIS — M25561 Pain in right knee: Secondary | ICD-10-CM | POA: Diagnosis not present

## 2023-11-05 LAB — BASIC METABOLIC PANEL WITH GFR
BUN/Creatinine Ratio: 23 (ref 12–28)
BUN: 24 mg/dL (ref 8–27)
CO2: 21 mmol/L (ref 20–29)
Calcium: 9.6 mg/dL (ref 8.7–10.3)
Chloride: 102 mmol/L (ref 96–106)
Creatinine, Ser: 1.03 mg/dL — ABNORMAL HIGH (ref 0.57–1.00)
Glucose: 102 mg/dL — ABNORMAL HIGH (ref 70–99)
Potassium: 4.6 mmol/L (ref 3.5–5.2)
Sodium: 138 mmol/L (ref 134–144)
eGFR: 56 mL/min/1.73 — ABNORMAL LOW (ref >59)

## 2023-11-05 LAB — MAGNESIUM: Magnesium: 2.3 mg/dL (ref 1.6–2.3)

## 2023-11-05 NOTE — Therapy (Signed)
 OUTPATIENT PHYSICAL THERAPY LOWER EXTREMITY TREATMENT       Patient Name: Cassandra Ho MRN: [id number] DOB:October 27, 1945, 78 y.o., female Today's Date: 11/05/2023  END OF SESSION:  PT End of Session - 11/05/23 1459     Visit Number 12    Number of Visits 17    Date for PT Re-Evaluation 12/13/23    Authorization Type [employer] mcr    Progress Note Due on Visit 20    PT Start Time 1430    PT Stop Time 1508    PT Time Calculation (min) 38 min    Activity Tolerance Patient tolerated treatment well    Behavior During Therapy WFL for tasks assessed/performed                Past Medical History:  Diagnosis Date   Allergic rhinitis    Arthritis    generalized-ankles and knees   BCC (basal cell carcinoma of skin)    Constipation    Dyslipidemia    Edema of both lower extremities    GERD (gastroesophageal reflux disease)    H/O: pituitary tumor    irradicated no problems now   Headache    migraines in past   High cholesterol    Hypertension    Hypothyroidism    Hypothyroidism (acquired)    Post-ablation   Joint pain    Morbid obesity (HCC)    OSA (obstructive sleep apnea)    intolerant to CPAP and now using an oral device   Osteoarthritis    Toxic solitary thyroid  nodule    radioactive iodine therapy with 31.4 mCi of I-131 on 12/05/2007   Transfusion history    with hip surgery   Past Surgical History:  Procedure Laterality Date   ANKLE ARTHROTOMY Left    ankle and foot reconstruction ;retained hardware   BREAST BIOPSY Bilateral    Many years ago, no scar seen    BREAST SURGERY     x2 biopsies(benign)   CARDIOVERSION N/A 03/14/2023   Procedure: CARDIOVERSION;  Surgeon: Raford Riggs, MD;  Location: [facility] INVASIVE CV LAB;  Service: Cardiovascular;  Laterality: N/A;   CARPAL TUNNEL RELEASE Bilateral    COLONOSCOPY WITH PROPOFOL  N/A 06/15/2014   Procedure: COLONOSCOPY WITH PROPOFOL ;  Surgeon: Gladis MARLA Louder, MD;  Location: [facility];  Service:  Endoscopy;  Laterality: N/A;   D&C  1977   DILATION AND CURETTAGE OF UTERUS  1973   SAB   DILATION AND CURETTAGE OF UTERUS  1974   DILATION AND CURETTAGE OF UTERUS  1975   DILATION AND CURETTAGE OF UTERUS  1978   DILATION AND CURETTAGE OF UTERUS  1991   HIP FRACTURE SURGERY     repaired with cader bone and metal plates   IUD REMOVAL  2000   hysteroscopy   KNEE ARTHROSCOPY Right    torn meniscus     TRANSESOPHAGEAL ECHOCARDIOGRAM (CATH LAB) N/A 03/14/2023   Procedure: TRANSESOPHAGEAL ECHOCARDIOGRAM;  Surgeon: Raford Riggs, MD;  Location: [facility] INVASIVE CV LAB;  Service: Cardiovascular;  Laterality: N/A;   Patient Active Problem List   Diagnosis Date Noted   Persistent atrial fibrillation (HCC) 10/01/2023   PAF (paroxysmal atrial fibrillation) (HCC) 03/22/2023   Obesity, class 3 03/13/2023   Hypothyroid 03/13/2023   Chronic heart failure with preserved ejection fraction (HFpEF) (HCC) 03/13/2023   Chronic HFrEF (heart failure with reduced ejection fraction) (HCC) 03/13/2023   Acute respiratory failure with hypoxia (HCC) 03/10/2023   Acute hypoxemic respiratory failure (HCC) 03/10/2023   Hypokalemia  03/09/2023   Atrial fibrillation with RVR (HCC) 03/08/2023   Acute on chronic systolic CHF (congestive heart failure) (HCC) 03/08/2023   Iatrogenic hyperthyroidism 03/08/2023   Urinary incontinence, mixed 01/07/2023   Vaginal erosion secondary to pessary use (HCC) 01/07/2023   Female genital prolapse 01/07/2023   Vaginal atrophy 01/07/2023   History of postmenopausal bleeding 01/07/2023   Nocturia 01/07/2023   Constipation 01/07/2023   Class 3 severe obesity with serious comorbidity and body mass index (BMI) of 45.0 to 49.9 in adult 11/27/2021   Vitamin D  deficiency 06/07/2021   Insulin  resistance 06/07/2021   OSA (obstructive sleep apnea) 10/16/2013   Dyslipidemia 10/16/2013   Obesity 10/16/2013   Essential hypertension, benign 10/16/2013   Fitting and adjustment of pessary  10/16/2013    PCP: Virginia  Cleotilde PA  REFERRING PROVIDER: Catherene Toribio Pac, MD   REFERRING DIAG: M17.0 (ICD-10-CM) - Bilateral primary osteoarthritis of knee   THERAPY DIAG:  Difficulty in walking, not elsewhere classified  Muscle weakness (generalized)  Bilateral chronic knee pain  Unsteadiness on feet  Rationale for Evaluation and Treatment: Rehabilitation  ONSET DATE: chronic  SUBJECTIVE:   SUBJECTIVE STATEMENT:  I was really pleased with the workout from last visit, but about an hour after I left I started having more issues with my knee and put it back in place. Knee has been really bad since last visit. Doctors have told me there's no more damage I can do to my knees. I've been really fatigued from the combination of medications I've been having to take, the MD told me that I need to not over exert myself.    POOL ACCESS: currently none.  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 Left knee> pain than right. 10 years ago left ankle reconstruction  Fell off of ladder ~ 39yrs ago and fx left femur.  Left knee goes out and I fall  PERTINENT HISTORY: Left femur fx 15 yrs ago Ankle arthrotomy 10 yrs ago OA CHF PAIN:  Are you having pain? Yes: NPRS scale: 8 L knee, 3 R knee /10 Pain location: bil knee Pain description: aching and like someone is jabbing a nail file into my left knee, like it will come out of place  Aggravating factors: standing 4 minutes; walk <5 min, steps Relieving factors: no OTC drugs due to bleeding risks, ice, resting, sitting  PRECAUTIONS: None  RED FLAGS: None   WEIGHT BEARING RESTRICTIONS: No  FALLS:  Has patient fallen in last 6 months? Yes. Number of falls 1  LIVING ENVIRONMENT: Lives with: lives with their family Lives in: House/apartment Stairs: Yes: Internal: 1 flight not used much to garage 5 steps  steps; on right going up Has following equipment at  home: Single point cane, Wheelchair (manual), and transport chair  OCCUPATION: retired  PLOF: Ind with equipment  PATIENT GOALS: decrease pain,maintain mobility  NEXT MD VISIT: next month  OBJECTIVE:  Note: Objective measures were completed at Evaluation unless otherwise noted.  DIAGNOSTIC FINDINGS: OA bilat knees. L>R  PATIENT SURVEYS:  LEFS   Extreme difficulty/unable (0), Quite a bit of difficulty (1), Moderate difficulty (2), Little difficulty (3), No difficulty (4) Survey date:  eval 8/25  Any of your usual work, housework or school activities 2   2. Usual hobbies, recreational or sporting activities 3   3. Getting into/out of the bath 2   4. Walking between rooms 2   5. Putting on socks/shoes  3   6. Squatting  0   7. Lifting an object, like a bag of groceries from the floor 3   8. Performing light activities around your home 3   9. Performing heavy activities around your home 1   10. Getting into/out of a car 3   11. Walking 2 blocks 0   12. Walking 1 mile 0   13. Going up/down 10 stairs (1 flight) 1   14. Standing for 1 hour 0   15.  sitting for 1 hour 4   16. Running on even ground 0   17. Running on uneven ground 0   18. Making sharp turns while running fast 0   19. Hopping  1   20. Rolling over in bed 2   Score total:  30/80 40/80     COGNITION: Overall cognitive status: Within functional limits for tasks assessed     SENSATION: WFL   MUSCLE LENGTH: Hamstrings: tested in sitting slightly tight   POSTURE: rounded shoulders and left knee varus deformity  PALPATION: Minor TTP along joint lines bilateral knees  LOWER EXTREMITY ROM:  Active ROM Right eval Left eval  Hip flexion    Hip extension    Hip abduction    Hip adduction    Hip internal rotation    Hip external rotation    Knee flexion 110d 100d  Knee extension 0 0  Ankle dorsiflexion    Ankle plantarflexion    Ankle inversion    Ankle eversion     (Blank rows = not  tested)  LOWER EXTREMITY MMT:  MMT Right eval Left eval  Hip flexion 32.9 43.3  Hip extension    Hip abduction    Hip adduction    Hip internal rotation    Hip external rotation    Knee flexion    Knee extension 40.2 48.5  Ankle dorsiflexion    Ankle plantarflexion    Ankle inversion    Ankle eversion     (Blank rows = not tested)    FUNCTIONAL TESTS:  Timed up and go (TUG): uable to tolerate without canes  STS from armed wc: heavy ue support Initial balance with cga  Balance FT: cl supervision  GAIT: Distance walked: 20 ft without ad Assistive device utilized: 2 canes (did not bring with her today) Level of assistance: CGA Comments: Antalgic gait:Offloading R and L, minimal knee flex during swing, hip circumduction to advance le                                                                                                                                TREATMENT  [facility]    11/05/23   Nustep L5x6 minutes all four extremities seat 11  Education on knee brace to help maintain patellar tracking/alignment, education on possible POC moving forward given increasing pain/difficulty tolerating land exercises  SAQs 0# x10 Bridges x5 Attempted supine SLRs, unable even with  R  LAQs 0# x10 B Seated SLRS 0# x10 B     10/29/23  Nustep L4x6 minutes all four extremities seat 11   Seated piriformis stretches 2x30 seconds B 3 way QL stretch x30 seconds each  Seated LAQ 2# x12 B Seated marches green TB alternating x20  HS curls 2# x12 B Seated clams green TB x30  Seated hip hikes x10 B        PATIENT EDUCATION:  Education details: intro to aquatic therapy  Person educated: Patient and Spouse Education method: Explanation Education comprehension: verbalized understanding  HOME EXERCISE PROGRAM: TAccess Code: [email or web address] URL: [email or web address] Date: 10/09/2023 Prepared by: Asberry Rodes  Aquatic This aquatic home exercise  program from MedBridge utilizes pictures from land based exercises, but has been adapted prior to lamination and issuance.   Access Code: [email or web address] URL: [email or web address] Date: 10/14/2023 Prepared by: Frankie Ziemba  Exercises - Hand buoy carry: Forward and Backward; bilaterally->unilaterally  - 1 x daily - 7 x weekly - 3 sets - 10 reps - Leg swings flex/ext  - 1 x daily - 7 x weekly - 3 sets - 10 reps - Standing Hip Abduction Adduction at Pool Wall  - 1 x daily - 7 x weekly - 3 sets - 10 reps - Piriformis Stretch with Pressure at [employer]  - 1 x daily - 7 x weekly - 3 sets - 10 reps - Standing Alternating Knee Flexion  - 1 x daily - 7 x weekly - 3 sets - 10 reps - Sit to Stand  - 1 x daily - 7 x weekly - 3 sets - 10 reps - Cycling on noodle/noodle wrapped under shoulders  - 1 x daily - 7 x weekly - 3 sets - 10 reps - Heel Toe Raises at Pool Wall  - 1 x daily - 7 x weekly - 3 sets - 10 reps   ASSESSMENT:  CLINICAL IMPRESSION:    Arrived with a lot more pain in her left knee, described having to hit my knee to get it back in place. Unsure if she means true patella dislocation or if her knee just feels unstable in general, or is locking. Modified intensity and difficulty of session today as appropriate, discussed brace that will help patella to track appropriately, also recommended f/u with ortho. I think she will really benefit from focus on water therapy moving forward- has been having a hard time tolerating land therapy these past couple land visits.      Initial impression:  Patient is a 78 y.o. f who was seen today for physical therapy evaluation and treatment for oa bilat knees. She presents with husband who assisted her in [facility] to setting.  Pt reports pain in bilateral knees L>R and is wanting TKR but due to a cardiac illness earlier this year is not a candidate for another 11 months.  Her pain sensitivity is high.  She is unable to take pain meds nor  anti-inflammatories due to cardiac issue. Exam demonstrates fairly good strength in quads.  Her fall risk is high as demonstrated through difficulty with immediate standing balance, requiring assistance to gain FT position and heavy ue assistance with all transitional movements..  She reports use of bilateral cane with amb in home if not furniture surfing. She did not bring her canes and is unable to tolerate completing other functional tests. Pt has had  recent fall due to left knee giving out.  She is a good candidate for skilled PT both  land and aquatic based.  Plan to use the properties of water initially to initiate movement and encourage muscle length, improve stability/balance and manage pain then transition onto land for increased loading to build strength  OBJECTIVE IMPAIRMENTS: Abnormal gait, decreased activity tolerance, decreased balance, decreased mobility, difficulty walking, decreased ROM, decreased strength, postural dysfunction, obesity, and pain.   ACTIVITY LIMITATIONS: carrying, lifting, bending, sitting, standing, squatting, sleeping, stairs, transfers, and locomotion level  PARTICIPATION LIMITATIONS: meal prep, cleaning, driving, shopping, community activity, and yard work  PERSONAL FACTORS: Time since onset of injury/illness/exacerbation and 1-2 comorbidities: see Pmhx are also affecting patient's functional outcome.   REHAB POTENTIAL: Good  CLINICAL DECISION MAKING: Evolving/moderate complexity  EVALUATION COMPLEXITY: Moderate   GOALS: Goals reviewed with patient? Yes  SHORT TERM GOALS: Target date: 09/13/23  Pt will tolerate full aquatic sessions consistently without increase in pain and with improving function to demonstrate good toleration and effectiveness of intervention.  Baseline: Goal status: Met 09/11/23  2.  Pt will negotiate steps entering and exiting pool Baseline:  Goal status: MET  -09/27/23  3.  Pt will have information and make decisions on  accessing pool in future for management of condtion Baseline:  Goal status:  Met 09/19/23    LONG TERM GOALS: Target date: 10/11/23  Pt to improve on LEFS by at least 9 point to demonstrate statistically significant Improvement in function. Baseline:see above  Goal status: Met 10/14/23  2.  Pt will be indep with final HEP's (land and aquatic as appropriate) for continued management of condition Baseline:  Goal status: In progress 10/14/23  3.  Pt will tolerate walking 10 consecutive minutes to demonstrate improved toleration to amb Baseline:  Goal status: In progress 10/14/23  4.  Pt will tolerate walking to or from setting (500 ft)  using approp AD and engaging in aquatic therapy session without excessive fatigue or increase in pain to demonstrate improved toleration to activity.  Baseline:  Goal status: Met 10/14/23  5.  Pt will report decrease in pain by at least 50% for improved toleration to activity/quality of life and to demonstrate improved management of pain. Baseline:  Goal status: Met 10/14/23  6.  Pt will report improvement in functional mobility and ADL's at home Baseline: wants to at least maintain Goal status: Met 10/14/23   7. Pt will amb community distances with AD resting as needed without excessive pain or fatigue.    Baseline: Max 500 ft    Goal Status: NEW    8. Pt will score 35-40/56 on Berg balance scale to demonstrate a reduced fall risk in medium to low risk category     Baseline: untested    Goal status: New  PLAN:  PT FREQUENCY: 1 x week  PT DURATION: 8 weeks 6 more visits total.  PLANNED INTERVENTIONS: 02835- PT Re-evaluation, 97750- Physical Performance Testing, 97110-Therapeutic exercises, 97530- Therapeutic activity, 97112- Neuromuscular re-education, 97535- Self Care, 02859- Manual therapy, [id number]- Gait training, [phone]- Orthotic Initial, [phone]- Aquatic Therapy, [phone]- Electrical stimulation (unattended), [phone]- Ionotophoresis 4mg /ml Dexamethasone ,  79439 (1-2 muscles), [id number] (3+ muscles)- Dry Needling, Patient/Family education, Balance training, Stair training, Taping, Joint mobilization, DME instructions, Cryotherapy, and Moist heat  Approved codes (insurance auth): therapeutic exercise, neuro -re ed; gait training; manual therapy; therapeutic activity; self care; performance testing; aquatic therapy; mechanical traction; US ; orthotic fit; vaso; PT re-eval.   PLAN FOR NEXT SESSION: instruct on final aquatic HEP, fully transition to land based intervention. How is she feeling? Do we need to talk  about referral back to ortho or consider return to water?    Josette Rough, PT, DPT 11/05/23 3:27 PM   Updated 10/14/23  Date of referral: 06/04/23 Referring provider:   Catherene Toribio Pac, MD   Referring diagnosis? M17.0 (ICD-10-CM) - Bilateral primary osteoarthritis of knee  Treatment diagnosis? (if different than referring diagnosis) no  What was this (referring dx) caused by? Arthritis  Nature of Condition: Chronic (continuous duration > 3 months)   Laterality: Both  Current Functional Measure Score: LEFS 40/80  Objective measurements identify impairments when they are compared to normal values, the uninvolved extremity, and prior level of function.  [x]  Yes  []  No  Objective assessment of functional ability: Moderate functional limitations   Briefly describe symptoms: chronic knee pain due to OA  How did symptoms start: chronic pain  Average pain intensity:  Last 24 hours: 2/10  Past week: 3.5/10  How often does the pt experience symptoms? Intermittent  How much have the symptoms interfered with usual daily activities? Moderate to little  How has condition changed since care began at this facility? better  In general, how is the patients overall health? Good   BACK PAIN (STarT Back Screening Tool) No

## 2023-11-06 NOTE — Progress Notes (Signed)
 Remote Loop Recorder Transmission

## 2023-11-08 ENCOUNTER — Ambulatory Visit: Payer: Self-pay | Admitting: Cardiology

## 2023-11-08 ENCOUNTER — Telehealth: Payer: Self-pay | Admitting: *Deleted

## 2023-11-08 NOTE — Telephone Encounter (Signed)
The patient has been notified of the result. Left detailed message on voicemail and informed patient to call back.

## 2023-11-08 NOTE — Telephone Encounter (Signed)
-----   Message from Cassandra Ho sent at 10/28/2023  4:44 PM EDT ----- No evidence of OSA - did patient wear her oral device during the HST?  SHe has nocturnal hypoxemia so needs in lab PSG for further evaluation>>may need nocturnal O2

## 2023-11-11 ENCOUNTER — Ambulatory Visit (HOSPITAL_COMMUNITY)
Admission: RE | Admit: 2023-11-11 | Discharge: 2023-11-11 | Disposition: A | Discharge status: Home / Self Care | Source: Ambulatory Visit | Attending: Cardiology | Admitting: Cardiology

## 2023-11-11 ENCOUNTER — Ambulatory Visit (HOSPITAL_COMMUNITY): Payer: Self-pay | Admitting: Cardiology

## 2023-11-11 DIAGNOSIS — E785 Hyperlipidemia, unspecified: Secondary | ICD-10-CM | POA: Insufficient documentation

## 2023-11-11 DIAGNOSIS — I110 Hypertensive heart disease with heart failure: Secondary | ICD-10-CM | POA: Insufficient documentation

## 2023-11-11 DIAGNOSIS — I5022 Chronic systolic (congestive) heart failure: Secondary | ICD-10-CM | POA: Diagnosis not present

## 2023-11-11 DIAGNOSIS — I499 Cardiac arrhythmia, unspecified: Secondary | ICD-10-CM | POA: Insufficient documentation

## 2023-11-11 DIAGNOSIS — I4891 Unspecified atrial fibrillation: Secondary | ICD-10-CM | POA: Insufficient documentation

## 2023-11-11 DIAGNOSIS — G4730 Sleep apnea, unspecified: Secondary | ICD-10-CM | POA: Insufficient documentation

## 2023-11-11 DIAGNOSIS — I081 Rheumatic disorders of both mitral and tricuspid valves: Secondary | ICD-10-CM | POA: Insufficient documentation

## 2023-11-11 LAB — ECHOCARDIOGRAM COMPLETE
Area-P 1/2: 2.08 cm2
S' Lateral: 3 cm

## 2023-11-12 ENCOUNTER — Encounter (HOSPITAL_BASED_OUTPATIENT_CLINIC_OR_DEPARTMENT_OTHER): Payer: Self-pay | Admitting: Physical Therapy

## 2023-11-12 ENCOUNTER — Ambulatory Visit: Admitting: Obstetrics and Gynecology

## 2023-11-12 ENCOUNTER — Encounter: Payer: Self-pay | Admitting: Obstetrics and Gynecology

## 2023-11-12 ENCOUNTER — Ambulatory Visit (HOSPITAL_BASED_OUTPATIENT_CLINIC_OR_DEPARTMENT_OTHER): Admitting: Physical Therapy

## 2023-11-12 VITALS — BP 94/60 | HR 59

## 2023-11-12 DIAGNOSIS — R262 Difficulty in walking, not elsewhere classified: Secondary | ICD-10-CM

## 2023-11-12 DIAGNOSIS — R2681 Unsteadiness on feet: Secondary | ICD-10-CM

## 2023-11-12 DIAGNOSIS — N819 Female genital prolapse, unspecified: Secondary | ICD-10-CM | POA: Diagnosis not present

## 2023-11-12 DIAGNOSIS — G8929 Other chronic pain: Secondary | ICD-10-CM

## 2023-11-12 DIAGNOSIS — M6281 Muscle weakness (generalized): Secondary | ICD-10-CM

## 2023-11-12 DIAGNOSIS — N393 Stress incontinence (female) (male): Secondary | ICD-10-CM | POA: Diagnosis not present

## 2023-11-12 DIAGNOSIS — M25562 Pain in left knee: Secondary | ICD-10-CM | POA: Diagnosis not present

## 2023-11-12 DIAGNOSIS — M25561 Pain in right knee: Secondary | ICD-10-CM | POA: Diagnosis not present

## 2023-11-12 DIAGNOSIS — N3946 Mixed incontinence: Secondary | ICD-10-CM

## 2023-11-12 NOTE — Therapy (Signed)
 OUTPATIENT PHYSICAL THERAPY LOWER EXTREMITY TREATMENT /PROGRESS NOTE      Patient Name: Cassandra Ho MRN: [id number] DOB:19-Apr-1945, 78 y.o., female Today's Date: 11/12/2023  Progress Note Reporting Period 10/15/23 to 11/12/23  See note below for Objective Data and Assessment of Progress/Goals.      END OF SESSION:  PT End of Session - 11/12/23 1517     Visit Number 13    Number of Visits 17    Date for Recertification  12/13/23    Authorization Type [employer] mcr    Progress Note Due on Visit 23   done visit 13   PT Start Time 1432    PT Stop Time 1510    PT Time Calculation (min) 38 min    Activity Tolerance Patient tolerated treatment well    Behavior During Therapy WFL for tasks assessed/performed                 Past Medical History:  Diagnosis Date   Allergic rhinitis    Arthritis    generalized-ankles and knees   BCC (basal cell carcinoma of skin)    Constipation    Dyslipidemia    Edema of both lower extremities    GERD (gastroesophageal reflux disease)    H/O: pituitary tumor    irradicated no problems now   Headache    migraines in past   High cholesterol    Hypertension    Hypothyroidism    Hypothyroidism (acquired)    Post-ablation   Joint pain    Morbid obesity (HCC)    OSA (obstructive sleep apnea)    intolerant to CPAP and now using an oral device   Osteoarthritis    Toxic solitary thyroid  nodule    radioactive iodine therapy with 31.4 mCi of I-131 on 12/05/2007   Transfusion history    with hip surgery   Past Surgical History:  Procedure Laterality Date   ANKLE ARTHROTOMY Left    ankle and foot reconstruction ;retained hardware   BREAST BIOPSY Bilateral    Many years ago, no scar seen    BREAST SURGERY     x2 biopsies(benign)   CARDIOVERSION N/A 03/14/2023   Procedure: CARDIOVERSION;  Surgeon: Raford Riggs, MD;  Location: [facility] INVASIVE CV LAB;  Service: Cardiovascular;  Laterality: N/A;   CARPAL TUNNEL RELEASE  Bilateral    COLONOSCOPY WITH PROPOFOL  N/A 06/15/2014   Procedure: COLONOSCOPY WITH PROPOFOL ;  Surgeon: Gladis MARLA Louder, MD;  Location: [facility] END[facility]COPY;  Service: Endoscopy;  Laterality: N/A;   D&C  1977   DILATION AND CURETTAGE OF UTERUS  1973   SAB   DILATION AND CURETTAGE OF UTERUS  1974   DILATION AND CURETTAGE OF UTERUS  1975   DILATION AND CURETTAGE OF UTERUS  1978   DILATION AND CURETTAGE OF UTERUS  1991   HIP FRACTURE SURGERY     repaired with cader bone and metal plates   IUD REMOVAL  2000   hysteroscopy   KNEE ARTHROSCOPY Right    torn meniscus     TRANSESOPHAGEAL ECHOCARDIOGRAM (CATH LAB) N/A 03/14/2023   Procedure: TRANSESOPHAGEAL ECHOCARDIOGRAM;  Surgeon: Raford Riggs, MD;  Location: [facility] INVASIVE CV LAB;  Service: Cardiovascular;  Laterality: N/A;   Patient Active Problem List   Diagnosis Date Noted   Persistent atrial fibrillation (HCC) 10/01/2023   PAF (paroxysmal atrial fibrillation) (HCC) 03/22/2023   Obesity, class 3 03/13/2023   Hypothyroid 03/13/2023   Chronic heart failure with preserved ejection fraction (HFpEF) (HCC) 03/13/2023   Chronic  HFrEF (heart failure with reduced ejection fraction) (HCC) 03/13/2023   Acute respiratory failure with hypoxia (HCC) 03/10/2023   Acute hypoxemic respiratory failure (HCC) 03/10/2023   Hypokalemia 03/09/2023   Atrial fibrillation with RVR (HCC) 03/08/2023   Acute on chronic systolic CHF (congestive heart failure) (HCC) 03/08/2023   Iatrogenic hyperthyroidism 03/08/2023   Urinary incontinence, mixed 01/07/2023   Vaginal erosion secondary to pessary use 01/07/2023   Female genital prolapse 01/07/2023   Vaginal atrophy 01/07/2023   History of postmenopausal bleeding 01/07/2023   Nocturia 01/07/2023   Constipation 01/07/2023   Class 3 severe obesity with serious comorbidity and body mass index (BMI) of 45.0 to 49.9 in adult 11/27/2021   Vitamin D  deficiency 06/07/2021   Insulin  resistance 06/07/2021   OSA (obstructive  sleep apnea) 10/16/2013   Dyslipidemia 10/16/2013   Obesity 10/16/2013   Essential hypertension, benign 10/16/2013   Fitting and adjustment of pessary 10/16/2013    PCP: Virginia  Cleotilde PA  REFERRING PROVIDER: Catherene Toribio Pac, MD   REFERRING DIAG: M17.0 (ICD-10-CM) - Bilateral primary osteoarthritis of knee   THERAPY DIAG:  Difficulty in walking, not elsewhere classified  Bilateral chronic knee pain  Unsteadiness on feet  Muscle weakness (generalized)  Rationale for Evaluation and Treatment: Rehabilitation  ONSET DATE: chronic  SUBJECTIVE:   SUBJECTIVE STATEMENT:  Felt good after last visit, I think it was that we didn't try to do as much as fast. The ECHO yesterday brought good news, heart is now functioning at 65-70% up from 49%! Got the brace you mentioned, have been wearing it some.    POOL ACCESS: currently none.  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 Left knee> pain than right. 10 years ago left ankle reconstruction  Fell off of ladder ~ 39yrs ago and fx left femur.  Left knee goes out and I fall  PERTINENT HISTORY: Left femur fx 15 yrs ago Ankle arthrotomy 10 yrs ago OA CHF PAIN:  Are you having pain? Yes: NPRS scale: 5 L knee, 3 R knee /10 Pain location: bil knee Pain description: aching and like someone is jabbing a nail file into my left knee, haven't had the coming out of place feeling as much this week Aggravating factors: standing 4 minutes; walk <5 min, steps Relieving factors: no OTC drugs due to bleeding risks, ice, resting, sitting  PRECAUTIONS: None  RED FLAGS: None   WEIGHT BEARING RESTRICTIONS: No  FALLS:  Has patient fallen in last 6 months? Yes. Number of falls 1  LIVING ENVIRONMENT: Lives with: lives with their family Lives in: House/apartment Stairs: Yes: Internal: 1 flight not used much to garage 5 steps  steps; on right going up Has following equipment  at home: Single point cane, Wheelchair (manual), and transport chair  OCCUPATION: retired  PLOF: Ind with equipment  PATIENT GOALS: decrease pain,maintain mobility  NEXT MD VISIT: next month  OBJECTIVE:  Note: Objective measures were completed at Evaluation unless otherwise noted.  DIAGNOSTIC FINDINGS: OA bilat knees. L>R  PATIENT SURVEYS:  LEFS   Extreme difficulty/unable (0), Quite a bit of difficulty (1), Moderate difficulty (2), Little difficulty (3), No difficulty (4) Survey date:  eval 8/25 11/12/23  Any of your usual work, housework or school activities 2  4  2. Usual hobbies, recreational or sporting activities 3  3  3. Getting into/out of the bath 2  3  4. Walking between rooms 2  3  5. Putting  on socks/shoes 3  4  6. Squatting  0  0  7. Lifting an object, like a bag of groceries from the floor 3  3  8. Performing light activities around your home 3  3  9. Performing heavy activities around your home 1  1  10. Getting into/out of a car 3  3  11. Walking 2 blocks 0  1  12. Walking 1 mile 0  0  13. Going up/down 10 stairs (1 flight) 1  1  14. Standing for 1 hour 0  0  15.  sitting for 1 hour 4  4  16. Running on even ground 0  0  17. Running on uneven ground 0  0  18. Making sharp turns while running fast 0  0  19. Hopping  1  0  20. Rolling over in bed 2  2  Score total:  30/80 40/80 35/80      COGNITION: Overall cognitive status: Within functional limits for tasks assessed     SENSATION: WFL   MUSCLE LENGTH: Hamstrings: tested in sitting slightly tight   POSTURE: rounded shoulders and left knee varus deformity  PALPATION: Minor TTP along joint lines bilateral knees  LOWER EXTREMITY ROM:  Active ROM Right eval Left eval 11/12/23 11/12/23  Hip flexion      Hip extension      Hip abduction      Hip adduction      Hip internal rotation      Hip external rotation      Knee flexion 110d 100d 112* 105*  Knee extension 0 0    Ankle dorsiflexion       Ankle plantarflexion      Ankle inversion      Ankle eversion       (Blank rows = not tested)  LOWER EXTREMITY MMT:  MMT Right eval Left eval 11/12/23 R 11/12/23 L  Hip flexion 32.9 43.3 3- 2+  Hip extension      Hip abduction      Hip adduction      Hip internal rotation      Hip external rotation      Knee flexion   4 3+  Knee extension 40.2 48.5 4 4-  Ankle dorsiflexion      Ankle plantarflexion      Ankle inversion      Ankle eversion       (Blank rows = not tested)    FUNCTIONAL TESTS:  Timed up and go (TUG): uable to tolerate without canes  STS from armed wc: heavy ue support Initial balance with cga  Balance FT: cl supervision  GAIT: Distance walked: 20 ft without ad Assistive device utilized: 2 canes (did not bring with her today) Level of assistance: CGA Comments: Antalgic gait:Offloading R and L, minimal knee flex during swing, hip circumduction to advance leg  TREATMENT  [facility] Adult [facility]    11/12/23   Nustep seat 11 L5x6 minutes all four extremities   MMT, ROM, goals, POC   SAQs 1.5# x10 B Bridges x7 Attempted supine SLRs, unable Seated SLRs 1.5# x10 B LAQs 1.5# x10 B Standing ABD 1.5# x5 B       11/05/23   Nustep L5x6 minutes all four extremities seat 11  Education on knee brace to help maintain patellar tracking/alignment, education on possible POC moving forward given increasing pain/difficulty tolerating land exercises  SAQs 0# x10 Bridges x5 Attempted supine SLRs, unable even with R  LAQs 0# x10 B Seated SLRS 0# x10 B     10/29/23  Nustep L4x6 minutes all four extremities seat 11   Seated piriformis stretches 2x30 seconds B 3 way QL stretch x30 seconds each  Seated LAQ 2# x12 B Seated marches green TB alternating x20  HS curls 2# x12 B Seated clams green TB x30  Seated hip hikes x10 B         PATIENT EDUCATION:  Education details: intro to aquatic therapy  Person educated: Patient and Spouse Education method: Explanation Education comprehension: verbalized understanding  HOME EXERCISE PROGRAM: TAccess Code: [email or web address] URL: [email or web address] Date: 10/09/2023 Prepared by: Asberry Rodes  Aquatic This aquatic home exercise program from MedBridge utilizes pictures from land based exercises, but has been adapted prior to lamination and issuance.   Access Code: [email or web address] URL: [email or web address] Date: 10/14/2023 Prepared by: Frankie Ziemba  Exercises - Hand buoy carry: Forward and Backward; bilaterally->unilaterally  - 1 x daily - 7 x weekly - 3 sets - 10 reps - Leg swings flex/ext  - 1 x daily - 7 x weekly - 3 sets - 10 reps - Standing Hip Abduction Adduction at Pool Wall  - 1 x daily - 7 x weekly - 3 sets - 10 reps - Piriformis Stretch with Pressure at [employer]  - 1 x daily - 7 x weekly - 3 sets - 10 reps - Standing Alternating Knee Flexion  - 1 x daily - 7 x weekly - 3 sets - 10 reps - Sit to Stand  - 1 x daily - 7 x weekly - 3 sets - 10 reps - Cycling on noodle/noodle wrapped under shoulders  - 1 x daily - 7 x weekly - 3 sets - 10 reps - Heel Toe Raises at Pool Wall  - 1 x daily - 7 x weekly - 3 sets - 10 reps   ASSESSMENT:  CLINICAL IMPRESSION:  Discussed case with aquatic PT, due to poor tolerance of land only exercises it is likely in her best interest to get back into consistent water PT for a bit patient agreeable. Updated measures and goals for progress note prior to transfer back to aquatic therapy, otherwise continued working on functional strength as time and knee pain allowed.      Initial impression:  Patient is a 78 y.o. f who was seen today for physical therapy evaluation and treatment for oa bilat knees. She presents with husband who assisted her in [facility] to setting.  Pt reports pain in bilateral knees L>R and  is wanting TKR but due to a cardiac illness earlier this year is not a candidate for another 11 months.  Her pain sensitivity is high.  She is unable to take pain meds nor anti-inflammatories due to cardiac issue. Exam demonstrates fairly good strength in quads.  Her fall risk is high as demonstrated through difficulty with immediate standing  balance, requiring assistance to gain FT position and heavy ue assistance with all transitional movements..  She reports use of bilateral cane with amb in home if not furniture surfing. She did not bring her canes and is unable to tolerate completing other functional tests. Pt has had  recent fall due to left knee giving out.  She is a good candidate for skilled PT both land and aquatic based.  Plan to use the properties of water initially to initiate movement and encourage muscle length, improve stability/balance and manage pain then transition onto land for increased loading to build strength  OBJECTIVE IMPAIRMENTS: Abnormal gait, decreased activity tolerance, decreased balance, decreased mobility, difficulty walking, decreased ROM, decreased strength, postural dysfunction, obesity, and pain.   ACTIVITY LIMITATIONS: carrying, lifting, bending, sitting, standing, squatting, sleeping, stairs, transfers, and locomotion level  PARTICIPATION LIMITATIONS: meal prep, cleaning, driving, shopping, community activity, and yard work  PERSONAL FACTORS: Time since onset of injury/illness/exacerbation and 1-2 comorbidities: see Pmhx are also affecting patient's functional outcome.   REHAB POTENTIAL: Good  CLINICAL DECISION MAKING: Evolving/moderate complexity  EVALUATION COMPLEXITY: Moderate   GOALS: Goals reviewed with patient? Yes  SHORT TERM GOALS: Target date: 09/13/23  Pt will tolerate full aquatic sessions consistently without increase in pain and with improving function to demonstrate good toleration and effectiveness of intervention.  Baseline: Goal  status: Met 09/11/23  2.  Pt will negotiate steps entering and exiting pool Baseline:  Goal status: MET  -09/27/23  3.  Pt will have information and make decisions on accessing pool in future for management of condtion Baseline:  Goal status:  Met 09/19/23    LONG TERM GOALS: Target date: 10/11/23  Pt to improve on LEFS by at least 9 point to demonstrate statistically significant Improvement in function. Baseline:see above  Goal status: ONGOING 11/12/23  2.  Pt will be indep with final HEP's (land and aquatic as appropriate) for continued management of condition Baseline:  Goal status: ONGOING 11/12/23  3.  Pt will tolerate walking 10 consecutive minutes to demonstrate improved toleration to amb Baseline:  Goal status: ONGOING 11/12/23  4.  Pt will tolerate walking to or from setting (500 ft)  using approp AD and engaging in aquatic therapy session without excessive fatigue or increase in pain to demonstrate improved toleration to activity.  Baseline:  Goal status: Met 10/14/23  5.  Pt will report decrease in pain by at least 50% for improved toleration to activity/quality of life and to demonstrate improved management of pain. Baseline:  Goal status: ONGOING 11/12/23  6.  Pt will report improvement in functional mobility and ADL's at home Baseline: wants to at least maintain Goal status: Met 10/14/23   7. Pt will amb community distances with AD resting as needed without excessive pain or fatigue.    Baseline: Max 500 ft    Goal Status: ONGOING 11/12/23    8. Pt will score 35-40/56 on Berg balance scale to demonstrate a reduced fall risk in medium to low risk category     Baseline: untested    Goal status: ONGOING 11/12/23  PLAN:  PT FREQUENCY: 1 x week  PT DURATION: 8 weeks 6 more visits total.  PLANNED INTERVENTIONS: 02835- PT Re-evaluation, 97750- Physical Performance Testing, 97110-Therapeutic exercises, 97530- Therapeutic activity, 97112- Neuromuscular re-education, 97535-  Self Care, 02859- Manual therapy, [id number]- Gait training, [phone]- Orthotic Initial, [phone]- Aquatic Therapy, [phone]- Electrical stimulation (unattended), [phone]- Ionotophoresis 4mg /ml Dexamethasone , 79439 (1-2 muscles), 20561 (3+ muscles)- Dry Needling, Patient/Family education, Balance training,  Stair training, Taping, Joint mobilization, DME instructions, Cryotherapy, and Moist heat  Approved codes (insurance auth): therapeutic exercise, neuro -re ed; gait training; manual therapy; therapeutic activity; self care; performance testing; aquatic therapy; mechanical traction; US ; orthotic fit; vaso; PT re-eval.   PLAN FOR NEXT SESSION: return to focused water PT, transition back to land when appropriate. Will need reauth to [facility] in 2 visits per front    Josette Rough, PT, DPT 11/12/23 3:18 PM   Updated 10/14/23  Date of referral: 06/04/23 Referring provider:   Catherene Toribio Pac, MD   Referring diagnosis? M17.0 (ICD-10-CM) - Bilateral primary osteoarthritis of knee  Treatment diagnosis? (if different than referring diagnosis) no  What was this (referring dx) caused by? Arthritis  Nature of Condition: Chronic (continuous duration > 3 months)   Laterality: Both  Current Functional Measure Score: LEFS 40/80  Objective measurements identify impairments when they are compared to normal values, the uninvolved extremity, and prior level of function.  [x]  Yes  []  No  Objective assessment of functional ability: Moderate functional limitations   Briefly describe symptoms: chronic knee pain due to OA  How did symptoms start: chronic pain  Average pain intensity:  Last 24 hours: 2/10  Past week: 3.5/10  How often does the pt experience symptoms? Intermittent  How much have the symptoms interfered with usual daily activities? Moderate to little  How has condition changed since care began at this facility? better  In general, how is the patients overall health? Good   BACK PAIN  (STarT Back Screening Tool) No

## 2023-11-12 NOTE — Progress Notes (Signed)
 [facility]   Subjective:     Chief Complaint:  Chief Complaint  Patient presents with   Pessary Check    Cassandra Ho is a 78 y.o. female is here for pessary adjustment.   History of Present Illness: Cassandra Ho is a 78 y.o. female with stage I pelvic organ prolapse and stress incontinence who presents for a pessary check. She is using a size #2 cube pessary. The pessary has been working well up until recently and she reports there has been a shift with increased leakage. She is using vaginal estrogen. She denies vaginal bleeding.  Past Medical History: Patient  has a past medical history of Allergic rhinitis, Arthritis, BCC (basal cell carcinoma of skin), Constipation, Dyslipidemia, Edema of both lower extremities, GERD (gastroesophageal reflux disease), H/O: pituitary tumor, Headache, High cholesterol, Hypertension, Hypothyroidism, Hypothyroidism (acquired), Joint pain, Morbid obesity (HCC), OSA (obstructive sleep apnea), Osteoarthritis, Toxic solitary thyroid  nodule, and Transfusion history.   Past Surgical History: She  has a past surgical history that includes Carpal tunnel release (Bilateral); Knee arthroscopy (Right); Breast surgery; Ankle Arthrotomy (Left); Hip fracture surgery; Colonoscopy with propofol  (N/A, 06/15/2014); Breast biopsy (Bilateral); torn meniscus; Dilation and curettage of uterus (1973); Dilation and curettage of uterus (1974); Dilation and curettage of uterus (1975); D&C (1977); Dilation and curettage of uterus (1978); Dilation and curettage of uterus (1991); IUD removal (2000); TRANSESOPHAGEAL ECHOCARDIOGRAM (N/A, 03/14/2023); and CARDIOVERSION (N/A, 03/14/2023).   Medications: She has a current medication list which includes the following prescription(s): acetaminophen , apixaban , calcium  carbonate-vit d-min, celecoxib , conjugated estrogens , dofetilide , duloxetine , empagliflozin , famotidine , furosemide , levothyroxine , metoprolol  succinate,  potassium chloride  sa, psyllium, rosuvastatin , entresto , spironolactone , and zolpidem.   Allergies: Patient has no known allergies.   Social History: Patient  reports that she has quit smoking. Her smoking use included cigarettes. She has never used smokeless tobacco. She reports current alcohol use. She reports that she does not use drugs.      Objective:    Physical Exam: BP 94/60   Pulse (!) 59  Gen: No apparent distress, A&O x 3. Detailed Urogynecologic Evaluation:  Pelvic Exam: Normal external female genitalia; Bartholin's and Skene's glands normal in appearance; urethral meatus normal in appearance, no urethral masses or discharge. The pessary was noted to be in place. It was removed and cleaned. Speculum exam revealed erythema in the vagina. The pessary was replaced. It was comfortable to the patient and fit well.     Assessment/Plan:    Assessment: Cassandra Ho is a 78 y.o. with stage I pelvic organ prolapse and stress incontinence here for a pessary check. She is doing well.  Plan: She will keep the pessary in place until next visit. She will continue to use estrogen. She will follow-up in 3 months for a pessary check or sooner as needed.  All questions were answered.

## 2023-11-14 ENCOUNTER — Ambulatory Visit (INDEPENDENT_AMBULATORY_CARE_PROVIDER_SITE_OTHER)

## 2023-11-14 DIAGNOSIS — I4891 Unspecified atrial fibrillation: Secondary | ICD-10-CM

## 2023-11-18 LAB — CUP PACEART REMOTE DEVICE CHECK
Date Time Interrogation Session: 20250926113900
Implantable Pulse Generator Implant Date: 20250521
Pulse Gen Serial Number: 146174

## 2023-11-19 ENCOUNTER — Encounter (HOSPITAL_BASED_OUTPATIENT_CLINIC_OR_DEPARTMENT_OTHER): Admitting: Physical Therapy

## 2023-11-20 NOTE — Progress Notes (Signed)
 Remote Loop Recorder Transmission

## 2023-11-21 NOTE — Progress Notes (Signed)
 Remote Loop Recorder Transmission

## 2023-11-24 NOTE — Progress Notes (Unsigned)
 Patient ID: Cassandra Ho                 DOB: [birth year]                    MRN: [id number]     HPI: Cassandra Ho is a 78 y.o. female patient referred to pharmacy clinic by Dr. Rolan to initiate GLP1-RA therapy. PMH is significant for HTN, HLD, atrial fibrillation, OSA, hypothyroidism, pituitary tumor, and chronic HFrEF, and obesity. Most recent BMI 45.  Most recent A1c  was 5.5 on 06/06/2021. Diagnosis of OSA.   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 medullary thyroid  carcinoma (MTC) or Multiple Endocrine Neoplasia syndrome type 2 (MEN 2  Assuming medicare would need to target OSA, tirzepatide?  Baseline weight and BMI: *** Current meds that affect weight: Jardiance  10 mg daily  Diet:   Exercise:   Family History:  Family History  Problem Relation Age of Onset   Thyroid  cancer Mother    Lung cancer Mother    Melanoma Mother    Diabetes Mother    Thyroid  disease Mother    Alcohol abuse Mother    Bladder Cancer Mother    Alcoholism Father    Cirrhosis Father    High blood pressure Father    High Cholesterol Father    Fibromyalgia Sister    Breast cancer Sister        30s   Breast cancer Maternal Grandmother        38s   Breast cancer Paternal Grandmother        50s   Breast cancer Daughter 7   Breast cancer Maternal Aunt        103s    Social History:   Labs: Lab Results  Component Value Date   HGBA1C 5.5 06/06/2021    Wt Readings from Last 1 Encounters:  11/04/23 279 lb (126.6 kg)    BP Readings from Last 1 Encounters:  11/12/23 94/60   Pulse Readings from Last 1 Encounters:  11/12/23 (!) 59       Component Value Date/Time   CHOL 116 06/11/2016 0942   CHOL 127 05/11/2013 0819   TRIG 149 06/11/2016 0942   TRIG 144 05/11/2013 0819   HDL 42 06/11/2016 0942   HDL 49 05/11/2013 0819   CHOLHDL 2.8 04/22/2015 0847   VLDL 31 (H) 04/22/2015  0847   LDLCALC 45 04/22/2015 0847   LDLCALC 49 05/11/2013 0819    Past Medical History:  Diagnosis Date   Allergic rhinitis    Arthritis    generalized-ankles and knees   BCC (basal cell carcinoma of skin)    Constipation    Dyslipidemia    Edema of both lower extremities    GERD (gastroesophageal reflux disease)    H/O: pituitary tumor    irradicated no problems now   Headache    migraines in past   High cholesterol    Hypertension    Hypothyroidism    Hypothyroidism (acquired)    Post-ablation   Joint pain    Morbid obesity (HCC)    OSA (obstructive sleep apnea)    intolerant to CPAP and now using an oral device   Osteoarthritis    Toxic solitary thyroid  nodule    radioactive iodine therapy with 31.4 mCi of I-131 on 12/05/2007   Transfusion history    with hip  surgery    Current Outpatient Medications on File Prior to Visit  Medication Sig Dispense Refill   acetaminophen  (TYLENOL ) 325 MG tablet Take 650 mg by mouth every 6 (six) hours as needed for moderate pain (pain score 4-6). (Patient taking differently: Take 650 mg by mouth as needed for moderate pain (pain score 4-6).)     apixaban  (ELIQUIS ) 5 MG TABS tablet Take 1 tablet (5 mg total) by mouth 2 (two) times daily. 60 tablet 5   Calcium  Carbonate-Vit D-Min (CALCIUM  1200 PO) Take 1 tablet by mouth every morning.     celecoxib  (CELEBREX ) 50 MG capsule Take 50 mg by mouth as needed for pain.     conjugated estrogens  (PREMARIN ) vaginal cream Place 1 Applicatorful vaginally 2 (two) times a week. Place 0.5g nightly for two weeks then twice a week after 30 g 2   dofetilide  (TIKOSYN ) 500 MCG capsule Take 1 capsule (500 mcg total) by mouth every 12 (twelve) hours. 60 capsule 6   DULoxetine  (CYMBALTA ) 60 MG capsule Take 1 capsule by mouth daily.     empagliflozin  (JARDIANCE ) 10 MG TABS tablet Take 1 tablet (10 mg total) by mouth daily. 90 tablet 3   famotidine  (PEPCID ) 20 MG tablet Take 20 mg by mouth daily as needed for  heartburn or indigestion. (Patient taking differently: Take 20 mg by mouth as needed for heartburn or indigestion.)     furosemide  (LASIX ) 40 MG tablet Take 1 tablet (40 mg total) by mouth 2 (two) times daily. 90 tablet 3   levothyroxine  (SYNTHROID ) 125 MCG tablet Take 1 tablet (125 mcg total) by mouth daily before breakfast. 30 tablet 0   metoprolol  succinate (TOPROL -XL) 25 MG 24 hr tablet Take 1 tablet (25 mg total) by mouth 2 (two) times daily. 180 tablet 3   potassium chloride  SA (KLOR-CON  M) 20 MEQ tablet Take 1 tablet (20 mEq total) by mouth daily. 30 tablet 6   psyllium (HYDROCIL/METAMUCIL) 95 % PACK Take 1 packet by mouth daily.     rosuvastatin  (CRESTOR ) 20 MG tablet Take 1 tablet (20 mg total) by mouth daily. Please make overdue appt with Dr. Shlomo before anymore refills. 2nd attempt 15 tablet 0   sacubitril -valsartan  (ENTRESTO ) 49-51 MG Take 1 tablet by mouth 2 (two) times daily. 60 tablet 11   spironolactone  (ALDACTONE ) 25 MG tablet Take 1 tablet (25 mg total) by mouth daily. 90 tablet 3   zolpidem (AMBIEN) 5 MG tablet Take 5 mg by mouth at bedtime as needed for sleep.     No current facility-administered medications on file prior to visit.    No Known Allergies   Assessment/Plan:  1. Weight loss - Patient has not met goal of at least 5% of body weight loss with comprehensive lifestyle modifications alone in the past 3-6 months. Pharmacotherapy is appropriate to pursue as augmentation. Will start ***. Confirmed patient not ***pregnant and no personal or family history of medullary thyroid  carcinoma (MTC) or Multiple Endocrine Neoplasia syndrome type 2 (MEN 2). Injection technique reviewed at today's visit.  Advised patient on common side effects including nausea, diarrhea, dyspepsia, decreased appetite, and fatigue. Counseled patient on reducing meal size and how to titrate medication to minimize side effects. Counseled patient to call if intolerable side effects or if experiencing  dehydration, abdominal pain, or dizziness. Patient will adhere to dietary modifications and will target at least 150 minutes of moderate intensity exercise weekly.   Follow up in 1 month via telephone for tolerability update and dose  titration.   Nidia Schaffer, PharmD PGY2 Cardiology Pharmacy Resident

## 2023-11-25 ENCOUNTER — Ambulatory Visit: Attending: Cardiology | Admitting: Pharmacist

## 2023-11-25 DIAGNOSIS — G4733 Obstructive sleep apnea (adult) (pediatric): Secondary | ICD-10-CM | POA: Diagnosis not present

## 2023-11-25 DIAGNOSIS — E66813 Obesity, class 3: Secondary | ICD-10-CM

## 2023-11-25 NOTE — Patient Instructions (Signed)

## 2023-11-26 ENCOUNTER — Ambulatory Visit: Payer: Self-pay | Admitting: Cardiovascular Disease

## 2023-11-26 ENCOUNTER — Other Ambulatory Visit (HOSPITAL_COMMUNITY): Payer: Self-pay

## 2023-11-26 ENCOUNTER — Telehealth: Payer: Self-pay

## 2023-11-26 ENCOUNTER — Encounter (HOSPITAL_BASED_OUTPATIENT_CLINIC_OR_DEPARTMENT_OTHER): Payer: Self-pay | Admitting: Physical Therapy

## 2023-11-26 ENCOUNTER — Ambulatory Visit (HOSPITAL_BASED_OUTPATIENT_CLINIC_OR_DEPARTMENT_OTHER): Attending: Pain Medicine | Admitting: Physical Therapy

## 2023-11-26 DIAGNOSIS — G8929 Other chronic pain: Secondary | ICD-10-CM | POA: Insufficient documentation

## 2023-11-26 DIAGNOSIS — I5022 Chronic systolic (congestive) heart failure: Secondary | ICD-10-CM | POA: Diagnosis not present

## 2023-11-26 DIAGNOSIS — I4891 Unspecified atrial fibrillation: Secondary | ICD-10-CM | POA: Diagnosis not present

## 2023-11-26 DIAGNOSIS — R2681 Unsteadiness on feet: Secondary | ICD-10-CM | POA: Diagnosis present

## 2023-11-26 DIAGNOSIS — E890 Postprocedural hypothyroidism: Secondary | ICD-10-CM | POA: Diagnosis not present

## 2023-11-26 DIAGNOSIS — F5101 Primary insomnia: Secondary | ICD-10-CM | POA: Diagnosis not present

## 2023-11-26 DIAGNOSIS — M25562 Pain in left knee: Secondary | ICD-10-CM | POA: Diagnosis present

## 2023-11-26 DIAGNOSIS — M6281 Muscle weakness (generalized): Secondary | ICD-10-CM | POA: Diagnosis present

## 2023-11-26 DIAGNOSIS — I10 Essential (primary) hypertension: Secondary | ICD-10-CM | POA: Diagnosis not present

## 2023-11-26 DIAGNOSIS — M25561 Pain in right knee: Secondary | ICD-10-CM | POA: Insufficient documentation

## 2023-11-26 DIAGNOSIS — R262 Difficulty in walking, not elsewhere classified: Secondary | ICD-10-CM | POA: Insufficient documentation

## 2023-11-26 NOTE — Therapy (Signed)
 OUTPATIENT PHYSICAL THERAPY LOWER EXTREMITY TREATMENT       Patient Name: Cassandra Ho MRN: [id number] DOB:08/07/1945, 78 y.o., female Today's Date: 11/26/2023     END OF SESSION:  PT End of Session - 11/26/23 1407     Visit Number 14    Number of Visits 17    Date for Recertification  12/13/23    Authorization Type [employer] mcr    Authorization Time Period 6 visits approved  10/14/23-12/09/23    Authorization - Visit Number 5    Authorization - Number of Visits 6    Progress Note Due on Visit 23   done visit 13   PT Start Time 1403    PT Stop Time 1442    PT Time Calculation (min) 39 min    Activity Tolerance Patient tolerated treatment well    Behavior During Therapy WFL for tasks assessed/performed                 Past Medical History:  Diagnosis Date   Allergic rhinitis    Arthritis    generalized-ankles and knees   BCC (basal cell carcinoma of skin)    Constipation    Dyslipidemia    Edema of both lower extremities    GERD (gastroesophageal reflux disease)    H/O: pituitary tumor    irradicated no problems now   Headache    migraines in past   High cholesterol    Hypertension    Hypothyroidism    Hypothyroidism (acquired)    Post-ablation   Joint pain    Morbid obesity (HCC)    OSA (obstructive sleep apnea)    intolerant to CPAP and now using an oral device   Osteoarthritis    Toxic solitary thyroid  nodule    radioactive iodine therapy with 31.4 mCi of I-131 on 12/05/2007   Transfusion history    with hip surgery   Past Surgical History:  Procedure Laterality Date   ANKLE ARTHROTOMY Left    ankle and foot reconstruction ;retained hardware   BREAST BIOPSY Bilateral    Many years ago, no scar seen    BREAST SURGERY     x2 biopsies(benign)   CARDIOVERSION N/A 03/14/2023   Procedure: CARDIOVERSION;  Surgeon: Raford Riggs, MD;  Location: [facility] INVASIVE CV LAB;  Service: Cardiovascular;  Laterality: N/A;   CARPAL TUNNEL RELEASE  Bilateral    COLONOSCOPY WITH PROPOFOL  N/A 06/15/2014   Procedure: COLONOSCOPY WITH PROPOFOL ;  Surgeon: Gladis MARLA Louder, MD;  Location: [facility] END[facility];  Service: Endoscopy;  Laterality: N/A;   D&C  1977   DILATION AND CURETTAGE OF UTERUS  1973   SAB   DILATION AND CURETTAGE OF UTERUS  1974   DILATION AND CURETTAGE OF UTERUS  1975   DILATION AND CURETTAGE OF UTERUS  1978   DILATION AND CURETTAGE OF UTERUS  1991   HIP FRACTURE SURGERY     repaired with cader bone and metal plates   IUD REMOVAL  2000   hysteroscopy   KNEE ARTHROSCOPY Right    torn meniscus     TRANSESOPHAGEAL ECHOCARDIOGRAM (CATH LAB) N/A 03/14/2023   Procedure: TRANSESOPHAGEAL ECHOCARDIOGRAM;  Surgeon: Raford Riggs, MD;  Location: [facility] INVASIVE CV LAB;  Service: Cardiovascular;  Laterality: N/A;   Patient Active Problem List   Diagnosis Date Noted   Persistent atrial fibrillation (HCC) 10/01/2023   PAF (paroxysmal atrial fibrillation) (HCC) 03/22/2023   Obesity, class 3 (HCC) 03/13/2023   Hypothyroid 03/13/2023   Chronic heart failure with preserved  ejection fraction (HFpEF) (HCC) 03/13/2023   Chronic HFrEF (heart failure with reduced ejection fraction) (HCC) 03/13/2023   Acute respiratory failure with hypoxia (HCC) 03/10/2023   Acute hypoxemic respiratory failure (HCC) 03/10/2023   Hypokalemia 03/09/2023   Atrial fibrillation with RVR (HCC) 03/08/2023   Acute on chronic systolic CHF (congestive heart failure) (HCC) 03/08/2023   Iatrogenic hyperthyroidism 03/08/2023   Urinary incontinence, mixed 01/07/2023   Vaginal erosion secondary to pessary use 01/07/2023   Female genital prolapse 01/07/2023   Vaginal atrophy 01/07/2023   History of postmenopausal bleeding 01/07/2023   Nocturia 01/07/2023   Constipation 01/07/2023   Class 3 severe obesity with serious comorbidity and body mass index (BMI) of 45.0 to 49.9 in adult (HCC) 11/27/2021   Vitamin D  deficiency 06/07/2021   Insulin  resistance 06/07/2021   OSA  (obstructive sleep apnea) 10/16/2013   Dyslipidemia 10/16/2013   Obesity 10/16/2013   Essential hypertension, benign 10/16/2013   Fitting and adjustment of pessary 10/16/2013    PCP: Virginia  Cleotilde PA  REFERRING PROVIDER: Catherene Toribio Pac, MD   REFERRING DIAG: M17.0 (ICD-10-CM) - Bilateral primary osteoarthritis of knee   THERAPY DIAG:  Difficulty in walking, not elsewhere classified  Bilateral chronic knee pain  Unsteadiness on feet  Muscle weakness (generalized)  Rationale for Evaluation and Treatment: Rehabilitation  ONSET DATE: chronic  SUBJECTIVE:   SUBJECTIVE STATEMENT: My knee Left knee popped out walking down the steps today pain 6/10   Felt good after last visit, I think it was that we didn't try to do as much as fast. The ECHO yesterday brought good news, heart is now functioning at 65-70% up from 49%! Got the brace you mentioned, have been wearing it some.    POOL ACCESS: currently none.  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 Left knee> pain than right. 10 years ago left ankle reconstruction  Fell off of ladder ~ 84yrs ago and fx left femur.  Left knee goes out and I fall  PERTINENT HISTORY: Left femur fx 15 yrs ago Ankle arthrotomy 10 yrs ago OA CHF PAIN:  Are you having pain? Yes: NPRS scale: 5 L knee, 3 R knee /10 Pain location: bil knee Pain description: aching and like someone is jabbing a nail file into my left knee, haven't had the coming out of place feeling as much this week Aggravating factors: standing 4 minutes; walk <5 min, steps Relieving factors: no OTC drugs due to bleeding risks, ice, resting, sitting  PRECAUTIONS: None  RED FLAGS: None   WEIGHT BEARING RESTRICTIONS: No  FALLS:  Has patient fallen in last 6 months? Yes. Number of falls 1  LIVING ENVIRONMENT: Lives with: lives with their family Lives in: House/apartment Stairs: Yes: Internal: 1  flight not used much to garage 5 steps  steps; on right going up Has following equipment at home: Single point cane, Wheelchair (manual), and transport chair  OCCUPATION: retired  PLOF: Ind with equipment  PATIENT GOALS: decrease pain,maintain mobility  NEXT MD VISIT: next month  OBJECTIVE:  Note: Objective measures were completed at Evaluation unless otherwise noted.  DIAGNOSTIC FINDINGS: OA bilat knees. L>R  PATIENT SURVEYS:  LEFS   Extreme difficulty/unable (0), Quite a bit of difficulty (1), Moderate difficulty (2), Little difficulty (3), No difficulty (4) Survey date:  eval 8/25 11/12/23  Any of your usual work, housework or school activities 2  4  2. Usual hobbies, recreational or sporting activities 3  3  3. Getting into/out of the bath 2  3  4. Walking between rooms 2  3  5. Putting on socks/shoes 3  4  6. Squatting  0  0  7. Lifting an object, like a bag of groceries from the floor 3  3  8. Performing light activities around your home 3  3  9. Performing heavy activities around your home 1  1  10. Getting into/out of a car 3  3  11. Walking 2 blocks 0  1  12. Walking 1 mile 0  0  13. Going up/down 10 stairs (1 flight) 1  1  14. Standing for 1 hour 0  0  15.  sitting for 1 hour 4  4  16. Running on even ground 0  0  17. Running on uneven ground 0  0  18. Making sharp turns while running fast 0  0  19. Hopping  1  0  20. Rolling over in bed 2  2  Score total:  30/80 40/80 35/80      COGNITION: Overall cognitive status: Within functional limits for tasks assessed     SENSATION: WFL   MUSCLE LENGTH: Hamstrings: tested in sitting slightly tight   POSTURE: rounded shoulders and left knee varus deformity  PALPATION: Minor TTP along joint lines bilateral knees  LOWER EXTREMITY ROM:  Active ROM Right eval Left eval 11/12/23 11/12/23  Hip flexion      Hip extension      Hip abduction      Hip adduction      Hip internal rotation      Hip external  rotation      Knee flexion 110d 100d 112* 105*  Knee extension 0 0    Ankle dorsiflexion      Ankle plantarflexion      Ankle inversion      Ankle eversion       (Blank rows = not tested)  LOWER EXTREMITY MMT:  MMT Right eval Left eval 11/12/23 R 11/12/23 L  Hip flexion 32.9 43.3 3- 2+  Hip extension      Hip abduction      Hip adduction      Hip internal rotation      Hip external rotation      Knee flexion   4 3+  Knee extension 40.2 48.5 4 4-  Ankle dorsiflexion      Ankle plantarflexion      Ankle inversion      Ankle eversion       (Blank rows = not tested)    FUNCTIONAL TESTS:  Timed up and go (TUG): uable to tolerate without canes  STS from armed wc: heavy ue support Initial balance with cga  Balance FT: cl supervision  GAIT: Distance walked: 20 ft without ad Assistive device utilized: 2 canes (did not bring with her today) Level of assistance: CGA Comments: Antalgic gait:Offloading R and L, minimal knee flex during swing, hip circumduction to advance leg  TREATMENT  [facility] Adult PT   11/26/23 [facility] Adult [facility] Treatment:                                             Pt seen for aquatic therapy today.  Treatment took place in water 3.5-4.75 ft in depth at the [facility] pool. Temp of water was 91.  Pt entered/exited the pool via stairs using step to pattern with hand rail.  -stair negotiation ascending steps.  Instruction verbally on sequencing (increased left knee pain) - Hand buoy carry: Forward and Backward; bilaterally->unilaterally   - toe raises; heel raises ue support yellow HB; hip add/abd - Leg swings flex/ext  ue support yellow -Forward march with kick - stair negotiation out of pool (difficult pt with increased knee pain)   Pt requires the buoyancy and hydrostatic pressure of water for support, and to offload  joints by unweighting joint load by at least 50 % in navel deep water and by at least 75-80% in chest to neck deep water.  Viscosity of the water is needed for resistance of strengthening. Water current perturbations provides challenge to standing balance requiring increased core activation.   11/12/23   Nustep seat 11 L5x6 minutes all four extremities   MMT, ROM, goals, POC   SAQs 1.5# x10 B Bridges x7 Attempted supine SLRs, unable Seated SLRs 1.5# x10 B LAQs 1.5# x10 B Standing ABD 1.5# x5 B       11/05/23   Nustep L5x6 minutes all four extremities seat 11  Education on knee brace to help maintain patellar tracking/alignment, education on possible POC moving forward given increasing pain/difficulty tolerating land exercises  SAQs 0# x10 Bridges x5 Attempted supine SLRs, unable even with R  LAQs 0# x10 B Seated SLRS 0# x10 B     10/29/23  Nustep L4x6 minutes all four extremities seat 11   Seated piriformis stretches 2x30 seconds B 3 way QL stretch x30 seconds each  Seated LAQ 2# x12 B Seated marches green TB alternating x20  HS curls 2# x12 B Seated clams green TB x30  Seated hip hikes x10 B        PATIENT EDUCATION:  Education details: intro to aquatic therapy  Person educated: Patient and Spouse Education method: Explanation Education comprehension: verbalized understanding  HOME EXERCISE PROGRAM: TAccess Code: [email or web address] URL: [email or web address] Date: 10/09/2023 Prepared by: Asberry Rodes  Aquatic This aquatic home exercise program from Med[employer] utilizes pictures from land based exercises, but has been adapted prior to lamination and issuance.   Access Code: [email or web address] URL: [email or web address] Date: 10/14/2023 Prepared by: Frankie Willadene Mounsey  Exercises - Hand buoy carry: Forward and Backward; bilaterally->unilaterally  - 1 x daily - 7 x weekly - 3 sets - 10 reps - Leg swings flex/ext  - 1 x daily - 7 x weekly -  3 sets - 10 reps - Standing Hip Abduction Adduction at Pool Wall  - 1 x daily - 7 x weekly - 3 sets - 10 reps - Piriformis Stretch with Pressure at [employer]  - 1 x daily - 7 x weekly - 3 sets - 10 reps - Standing Alternating Knee Flexion  - 1 x daily - 7 x weekly - 3 sets - 10 reps - Sit to Stand  - 1 x daily - 7 x weekly - 3 sets - 10 reps - Cycling on noodle/noodle  wrapped under shoulders  - 1 x daily - 7 x weekly - 3 sets - 10 reps - Heel Toe Raises at Pool Wall  - 1 x daily - 7 x weekly - 3 sets - 10 reps   ASSESSMENT:  CLINICAL IMPRESSION:  Pt reports knee popping out walking down step to come to therapy.  She has increased pain sensitivity today and has increased limitation to session. Instruction on proper technique for climbing stairs.  She is  encouraged to use her knee braces when needing to negotiate stairs for added support medial and laterally. Suggested she talk with Md about possible referral for a power chair to increase her indep.  Next session is last session.  She has already been instructed on final aquatic HEP.  She was sent back to pool after last land session due to her inability to tolerate.  She is approaching meeting her max potential in therapy.  Anticipated dc next session. She was re-issued list of pools in area.       Initial impression:  Patient is a 78 y.o. f who was seen today for physical therapy evaluation and treatment for oa bilat knees. She presents with husband who assisted her in [facility] to setting.  Pt reports pain in bilateral knees L>R and is wanting TKR but due to a cardiac illness earlier this year is not a candidate for another 11 months.  Her pain sensitivity is high.  She is unable to take pain meds nor anti-inflammatories due to cardiac issue. Exam demonstrates fairly good strength in quads.  Her fall risk is high as demonstrated through difficulty with immediate standing balance, requiring assistance to gain FT position and heavy ue assistance with  all transitional movements..  She reports use of bilateral cane with amb in home if not furniture surfing. She did not bring her canes and is unable to tolerate completing other functional tests. Pt has had  recent fall due to left knee giving out.  She is a good candidate for skilled PT both land and aquatic based.  Plan to use the properties of water initially to initiate movement and encourage muscle length, improve stability/balance and manage pain then transition onto land for increased loading to build strength  OBJECTIVE IMPAIRMENTS: Abnormal gait, decreased activity tolerance, decreased balance, decreased mobility, difficulty walking, decreased ROM, decreased strength, postural dysfunction, obesity, and pain.   ACTIVITY LIMITATIONS: carrying, lifting, bending, sitting, standing, squatting, sleeping, stairs, transfers, and locomotion level  PARTICIPATION LIMITATIONS: meal prep, cleaning, driving, shopping, community activity, and yard work  PERSONAL FACTORS: Time since onset of injury/illness/exacerbation and 1-2 comorbidities: see Pmhx are also affecting patient's functional outcome.   REHAB POTENTIAL: Good  CLINICAL DECISION MAKING: Evolving/moderate complexity  EVALUATION COMPLEXITY: Moderate   GOALS: Goals reviewed with patient? Yes  SHORT TERM GOALS: Target date: 09/13/23  Pt will tolerate full aquatic sessions consistently without increase in pain and with improving function to demonstrate good toleration and effectiveness of intervention.  Baseline: Goal status: Met 09/11/23  2.  Pt will negotiate steps entering and exiting pool Baseline:  Goal status: MET  -09/27/23  3.  Pt will have information and make decisions on accessing pool in future for management of condtion Baseline:  Goal status:  Met 09/19/23    LONG TERM GOALS: Target date: 10/11/23  Pt to improve on LEFS by at least 9 point to demonstrate statistically significant Improvement in  function. Baseline:see above  Goal status: ONGOING 11/12/23  2.  Pt will be  indep with final HEP's (land and aquatic as appropriate) for continued management of condition Baseline:  Goal status: ONGOING 11/12/23  3.  Pt will tolerate walking 10 consecutive minutes to demonstrate improved toleration to amb Baseline:  Goal status: ONGOING 11/12/23  4.  Pt will tolerate walking to or from setting (500 ft)  using approp AD and engaging in aquatic therapy session without excessive fatigue or increase in pain to demonstrate improved toleration to activity.  Baseline:  Goal status: Met 10/14/23  5.  Pt will report decrease in pain by at least 50% for improved toleration to activity/quality of life and to demonstrate improved management of pain. Baseline:  Goal status: ONGOING 11/12/23  6.  Pt will report improvement in functional mobility and ADL's at home Baseline: wants to at least maintain Goal status: Met 10/14/23   7. Pt will amb community distances with AD resting as needed without excessive pain or fatigue.    Baseline: Max 500 ft    Goal Status: ONGOING 11/12/23    8. Pt will score 35-40/56 on Berg balance scale to demonstrate a reduced fall risk in medium to low risk category     Baseline: untested    Goal status: ONGOING 11/12/23  PLAN:  PT FREQUENCY: 1 x week  PT DURATION: 8 weeks 6 more visits total.  PLANNED INTERVENTIONS: 02835- PT Re-evaluation, 97750- Physical Performance Testing, 97110-Therapeutic exercises, 97530- Therapeutic activity, 97112- Neuromuscular re-education, 97535- Self Care, 02859- Manual therapy, [id number]- Gait training, [phone]- Orthotic Initial, [phone]- Aquatic Therapy, [phone]- Electrical stimulation (unattended), [phone]- Ionotophoresis 4mg /ml Dexamethasone , 79439 (1-2 muscles), 20561 (3+ muscles)- Dry Needling, Patient/Family education, Balance training, Stair training, Taping, Joint mobilization, DME instructions, Cryotherapy, and Moist heat  Approved codes  (insurance auth): therapeutic exercise, neuro -re ed; gait training; manual therapy; therapeutic activity; self care; performance testing; aquatic therapy; mechanical traction; US ; orthotic fit; vaso; PT re-eval.   PLAN FOR NEXT SESSION: return to focused water PT, transition back to land when appropriate. Will need reauth to [facility] in 2 visits per front    Ronal Kem) Aaronmichael Brumbaugh MPT 11/26/23 3:57 PM [facility] [address] [place], [place], [address] Phone: [phone]   Fax:  [phone]    Updated 10/14/23  Date of referral: 06/04/23 Referring provider:   Catherene Toribio Pac, MD   Referring diagnosis? M17.0 (ICD-10-CM) - Bilateral primary osteoarthritis of knee  Treatment diagnosis? (if different than referring diagnosis) no  What was this (referring dx) caused by? Arthritis  Nature of Condition: Chronic (continuous duration > 3 months)   Laterality: Both  Current Functional Measure Score: LEFS 40/80  Objective measurements identify impairments when they are compared to normal values, the uninvolved extremity, and prior level of function.  [x]  Yes  []  No  Objective assessment of functional ability: Moderate functional limitations   Briefly describe symptoms: chronic knee pain due to OA  How did symptoms start: chronic pain  Average pain intensity:  Last 24 hours: 2/10  Past week: 3.5/10  How often does the pt experience symptoms? Intermittent  How much have the symptoms interfered with usual daily activities? Moderate to little  How has condition changed since care began at this facility? better  In general, how is the patients overall health? Good   BACK PAIN (STarT Back Screening Tool) No

## 2023-11-26 NOTE — Telephone Encounter (Addendum)
 Pharmacy Patient Advocate Encounter   Received notification from Physician's Office that prior authorization for ZEPBOUND is required/requested.   Insurance verification completed.   The patient is insured through [employer].  Per test claim: PA required; PA started via CoverMyMeds. KEY [id number] . Please see clinical question(s) below that I am not finding the answer to in their chart and advise.  Has the provider submitted medical records (for example, chart notes) confirming diagnosis of moderate to severe obstructive sleep apnea (for example, 15 or more obstructive respiratory events [apnea-hypopnea index (AHI)] per hour of sleep confirmed by a sleep study)? Failure to submit medical records may lead to an adverse determination. Please advise.

## 2023-11-26 NOTE — Telephone Encounter (Signed)
-----   Message from Eleanor JONETTA Crews sent at 11/25/2023  4:38 PM EDT ----- Please submit pa for zepbound. Please submit under OSA and submit the sleep study from 2012

## 2023-11-27 ENCOUNTER — Encounter: Payer: Self-pay | Admitting: Pharmacist

## 2023-11-27 NOTE — Telephone Encounter (Signed)
 Patient contacted via mychart.

## 2023-11-27 NOTE — Telephone Encounter (Signed)
 Patient does not qualify for zepbound for OSA given no evidence of OSA in most recent sleep study on 10/27/2023. Insurance needs study to be within the last 5 years.

## 2023-11-29 ENCOUNTER — Other Ambulatory Visit (HOSPITAL_BASED_OUTPATIENT_CLINIC_OR_DEPARTMENT_OTHER): Payer: Self-pay

## 2023-11-29 MED ORDER — ZEPBOUND 2.5 MG/0.5ML ~~LOC~~ SOLN: 2.5000 mg | SUBCUTANEOUS | 0 refills | Status: DC

## 2023-12-02 ENCOUNTER — Ambulatory Visit (HOSPITAL_BASED_OUTPATIENT_CLINIC_OR_DEPARTMENT_OTHER): Admitting: Physical Therapy

## 2023-12-02 ENCOUNTER — Ambulatory Visit: Admitting: Obstetrics and Gynecology

## 2023-12-02 ENCOUNTER — Encounter (HOSPITAL_BASED_OUTPATIENT_CLINIC_OR_DEPARTMENT_OTHER): Payer: Self-pay

## 2023-12-03 ENCOUNTER — Encounter: Payer: Self-pay | Admitting: *Deleted

## 2023-12-04 ENCOUNTER — Ambulatory Visit (HOSPITAL_BASED_OUTPATIENT_CLINIC_OR_DEPARTMENT_OTHER): Admitting: Physical Therapy

## 2023-12-09 ENCOUNTER — Encounter: Payer: Self-pay | Admitting: Obstetrics and Gynecology

## 2023-12-11 ENCOUNTER — Ambulatory Visit (HOSPITAL_BASED_OUTPATIENT_CLINIC_OR_DEPARTMENT_OTHER): Admitting: Physical Therapy

## 2023-12-13 ENCOUNTER — Ambulatory Visit (HOSPITAL_BASED_OUTPATIENT_CLINIC_OR_DEPARTMENT_OTHER): Admitting: Physical Therapy

## 2023-12-16 ENCOUNTER — Ambulatory Visit (INDEPENDENT_AMBULATORY_CARE_PROVIDER_SITE_OTHER)

## 2023-12-16 ENCOUNTER — Ambulatory Visit (HOSPITAL_BASED_OUTPATIENT_CLINIC_OR_DEPARTMENT_OTHER): Admitting: Physical Therapy

## 2023-12-16 DIAGNOSIS — I4891 Unspecified atrial fibrillation: Secondary | ICD-10-CM | POA: Diagnosis not present

## 2023-12-17 LAB — CUP PACEART REMOTE DEVICE CHECK
Date Time Interrogation Session: 20251027014300
Implantable Pulse Generator Implant Date: 20250521
Pulse Gen Serial Number: 146174

## 2023-12-18 ENCOUNTER — Ambulatory Visit (HOSPITAL_BASED_OUTPATIENT_CLINIC_OR_DEPARTMENT_OTHER): Admitting: Physical Therapy

## 2023-12-20 ENCOUNTER — Other Ambulatory Visit: Payer: Self-pay | Admitting: Obstetrics

## 2023-12-20 DIAGNOSIS — N898 Other specified noninflammatory disorders of vagina: Secondary | ICD-10-CM

## 2023-12-22 ENCOUNTER — Other Ambulatory Visit: Payer: Self-pay | Admitting: Cardiovascular Disease

## 2023-12-22 DIAGNOSIS — I480 Paroxysmal atrial fibrillation: Secondary | ICD-10-CM

## 2023-12-23 NOTE — Telephone Encounter (Signed)
 Pt saw Daphne Barrack, NP on 11/04/23, last labs 11/04/23 Creat 1.03, age 78, weight 126.6kg, based on specified criteria pt is on appropriate dosage of Eliquis  5mg  BID for afib.  Will refill rx.

## 2023-12-24 ENCOUNTER — Other Ambulatory Visit: Payer: Self-pay | Admitting: Cardiology

## 2023-12-24 NOTE — Progress Notes (Signed)
 Remote Loop Recorder Transmission

## 2023-12-26 ENCOUNTER — Ambulatory Visit: Payer: Self-pay | Admitting: Cardiovascular Disease

## 2023-12-31 ENCOUNTER — Other Ambulatory Visit (HOSPITAL_BASED_OUTPATIENT_CLINIC_OR_DEPARTMENT_OTHER): Payer: Self-pay

## 2024-01-06 ENCOUNTER — Ambulatory Visit: Admitting: Obstetrics and Gynecology

## 2024-01-06 ENCOUNTER — Encounter: Payer: Self-pay | Admitting: Obstetrics and Gynecology

## 2024-01-06 VITALS — BP 105/71 | HR 63

## 2024-01-06 DIAGNOSIS — N819 Female genital prolapse, unspecified: Secondary | ICD-10-CM | POA: Diagnosis not present

## 2024-01-06 DIAGNOSIS — N393 Stress incontinence (female) (male): Secondary | ICD-10-CM

## 2024-01-06 NOTE — Progress Notes (Signed)
 [facility]   Subjective:     Chief Complaint:  Chief Complaint  Patient presents with   Pessary fitting    Cassandra Ho is a 78 y.o. female is here for pessary fitting.   History of Present Illness: Cassandra Ho is a 78 y.o. female with stage I pelvic organ prolapse and stress incontinence who presents for a pessary check. She is using a size #2 cube pessary. Patient reports recent increase in SUI that is very bothersome. She is using vaginal estrogen. She denies vaginal bleeding.  Past Medical History: Patient  has a past medical history of Allergic rhinitis, Arthritis, BCC (basal cell carcinoma of skin), Constipation, Dyslipidemia, Edema of both lower extremities, GERD (gastroesophageal reflux disease), H/O: pituitary tumor, Headache, High cholesterol, Hypertension, Hypothyroidism, Hypothyroidism (acquired), Joint pain, Morbid obesity (HCC), OSA (obstructive sleep apnea), Osteoarthritis, Toxic solitary thyroid  nodule, and Transfusion history.   Past Surgical History: She  has a past surgical history that includes Carpal tunnel release (Bilateral); Knee arthroscopy (Right); Breast surgery; Ankle Arthrotomy (Left); Hip fracture surgery; Colonoscopy with propofol  (N/A, 06/15/2014); Breast biopsy (Bilateral); torn meniscus; Dilation and curettage of uterus (1973); Dilation and curettage of uterus (1974); Dilation and curettage of uterus (1975); D&C (1977); Dilation and curettage of uterus (1978); Dilation and curettage of uterus (1991); IUD removal (2000); TRANSESOPHAGEAL ECHOCARDIOGRAM (N/A, 03/14/2023); and CARDIOVERSION (N/A, 03/14/2023).   Medications: She has a current medication list which includes the following prescription(s): acetaminophen , calcium  carbonate-vit d-min, celecoxib , dofetilide , duloxetine , eliquis , empagliflozin , famotidine , furosemide , levothyroxine , metoprolol  succinate, potassium chloride  sa, premarin , psyllium, rosuvastatin , entresto ,  spironolactone , zepbound, and zolpidem.   Allergies: Patient has no known allergies.   Social History: Patient  reports that she has quit smoking. Her smoking use included cigarettes. She has never used smokeless tobacco. She reports current alcohol use. She reports that she does not use drugs.      Objective:    Physical Exam: BP 105/71   Pulse 63  Gen: No apparent distress, A&O x 3. Detailed Urogynecologic Evaluation:  Pelvic Exam: Normal external female genitalia; Bartholin's and Skene's glands normal in appearance; urethral meatus normal in appearance, no urethral masses or discharge. The pessary was noted to be in place. It was removed and cleaned. Speculum exam revealed erythema in the vagina. The pessary was replaced with a #3 Cube pessary ([id number]) after trialing incontinence dishes and rings that were expelled on exam or too long for TVL. It was comfortable to the patient and fit well.   +CST on Exam  Assessment/Plan:    Assessment: Cassandra Ho is a 78 y.o. with stage I pelvic organ prolapse and stress incontinence here for a pessary check. She is doing well.  Plan: She will keep the pessary in place until next visit. She will continue to use estrogen.   Patient's main issue is her leaking. We discussed that if we were to plan for an office procedure like Urethral bulking she may be happier than dealing with a pessary. She is open to considering this. She would be interested in pursuing UB since pessaries are allowing for leakage and the incontinence pessaries were either too long or not supportive enough. Patient to plan for UB.

## 2024-01-16 ENCOUNTER — Ambulatory Visit

## 2024-01-16 DIAGNOSIS — I480 Paroxysmal atrial fibrillation: Secondary | ICD-10-CM

## 2024-01-17 LAB — CUP PACEART REMOTE DEVICE CHECK
Date Time Interrogation Session: 20251127094700
Implantable Pulse Generator Implant Date: 20250521
Pulse Gen Serial Number: 146174

## 2024-01-20 ENCOUNTER — Other Ambulatory Visit: Payer: Self-pay | Admitting: Cardiology

## 2024-01-21 ENCOUNTER — Encounter: Payer: Self-pay | Admitting: Pharmacist

## 2024-01-21 NOTE — Progress Notes (Signed)
 Remote Loop Recorder Transmission

## 2024-01-22 ENCOUNTER — Other Ambulatory Visit (HOSPITAL_COMMUNITY): Payer: Self-pay | Admitting: Cardiology

## 2024-01-23 ENCOUNTER — Ambulatory Visit: Payer: Self-pay | Admitting: Cardiovascular Disease

## 2024-01-24 ENCOUNTER — Other Ambulatory Visit (HOSPITAL_BASED_OUTPATIENT_CLINIC_OR_DEPARTMENT_OTHER): Payer: Self-pay

## 2024-01-29 NOTE — Progress Notes (Signed)
 Primary Care: [place], [place]  E, PA HF Cardiology: Dr. Rolan  HPI: 78 y.o. referred for evaluation of CHF from [facility] clinic.   Patient has a history of HTN, HLD, atrial fibrillation, OSA, hypothyroidism, pituitary tumor, and chronic HFrEF. Last cigarette 50 years ago.   Admitted 03/08/23 with atrial fibrillation/RVR, new diagnosis.  Underwent cardioversion. Echo showed reduced LVEF 30-35%, normal RV, mild MR.  Placed on 25 mg Toprol  XL daily. Started on GDMT.   She was seen in the office 03/22/23 and was noted to be back in AF/RVR. She was sent to the ER and cardioverted in the ER, then sent home. Amiodarone  was started.   cMRI (4/25) showed LV EF 54%, RVEF 56%, small area of mid-wall LGE in the apical septum (?prior myocarditis).   She was seen by EP, not thought to be a candidate for AF ablation, and was started on Tikosyn  to maintain NSR.   Echo 9/25 showed EF 65-70%, G1DD, RV normal  Today she returns for HF follow up. Overall feeling fine. No undue dyspnea with ADLs, mostly limited by severe knee OA. She uses a WC when outside of the house and walker inside the house. He has occasional bendopnea. Denies palpitations, abnormal bleeding, CP, dizziness, edema, or PND/Orthopnea. Appetite ok. Weight at home 258 pounds. Taking all medications.  Down 25 lbs with GLP1 and has noticed pain and functional improvement.  ECG (personally reviewed): none ordered today.  PMH: 1. Pituitary tumor: Remote.  2. 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  3. OSA 4. HTN 5. Atrial fibrillation: Paroxysmal.  - TEE-guided DCCV 03/14/23.  - DCCV 03/22/23 - Tikosyn  started.  6. Chronic systolic CHF: Echo (1/25) with LVEF 30-35%, RV normal, mild MR - Cardiac MRI (4/25): LV EF 54%, RVEF 56%, small area of mid-wall LGE in the apical septum (?prior myocarditis).  - Echo (9/25): EF 65-70%, G1DD, RV normal 7. Hyperlipidemia 8. Obesity  Current Outpatient  Medications  Medication Sig Dispense Refill   acetaminophen  (TYLENOL ) 325 MG tablet Take 650 mg by mouth every 6 (six) hours as needed for moderate pain (pain score 4-6). (Patient taking differently: Take 650 mg by mouth as needed for moderate pain (pain score 4-6).)     Calcium  Carbonate-Vit D-Min (CALCIUM  1200 PO) Take 1 tablet by mouth every morning.     celecoxib  (CELEBREX ) 50 MG capsule Take 50 mg by mouth as needed for pain.     dofetilide  (TIKOSYN ) 500 MCG capsule Take 1 capsule (500 mcg total) by mouth every 12 (twelve) hours. 60 capsule 6   DULoxetine  (CYMBALTA ) 60 MG capsule Take 1 capsule by mouth daily.     ELIQUIS  5 MG TABS tablet TAKE 1 TABLET BY MOUTH TWICE A DAY 60 tablet 5   empagliflozin  (JARDIANCE ) 10 MG TABS tablet Take 1 tablet (10 mg total) by mouth daily. 90 tablet 3   famotidine  (PEPCID ) 20 MG tablet Take 20 mg by mouth daily as needed for heartburn or indigestion. (Patient taking differently: Take 20 mg by mouth as needed for heartburn or indigestion.)     furosemide  (LASIX ) 40 MG tablet TAKE 1 TABLET BY MOUTH TWICE A DAY 180 tablet 1   levothyroxine  (SYNTHROID ) 125 MCG tablet Take 1 tablet (125 mcg total) by mouth daily before breakfast. 30 tablet 0   metoprolol  succinate (TOPROL -XL) 25 MG 24 hr tablet Take 1 tablet (25 mg total) by mouth 2 (two) times daily. 180 tablet 3   potassium chloride  SA (KLOR-CON   M) 20 MEQ tablet Take 1 tablet (20 mEq total) by mouth daily. 30 tablet 6   PREMARIN  vaginal cream PLACE 1 APPLICATORFUL VAGINALLY 2 (TWO) TIMES A WEEK. PLACE 0.5G NIGHTLY FOR TWO WEEKS THEN TWICE A WEEK AFTER 30 g 2   psyllium (HYDROCIL/METAMUCIL) 95 % PACK Take 1 packet by mouth daily.     rosuvastatin  (CRESTOR ) 20 MG tablet Take 1 tablet (20 mg total) by mouth daily. Please make overdue appt with Dr. Shlomo before anymore refills. 2nd attempt 15 tablet 0   sacubitril -valsartan  (ENTRESTO ) 49-51 MG Take 1 tablet by mouth 2 (two) times daily. 60 tablet 11   spironolactone   (ALDACTONE ) 25 MG tablet Take 1 tablet (25 mg total) by mouth daily. 90 tablet 3   tirzepatide  7.5 MG/0.5ML injection vial Inject 7.5 mg into the skin once a week. 2 mL 0   zolpidem (AMBIEN) 5 MG tablet Take 5 mg by mouth at bedtime as needed for sleep.     No current facility-administered medications for this encounter.   No Known Allergies  Social History   Socioeconomic History   Marital status: Married    Spouse name: Helayne   Number of children: 3   Years of education: Not on file   Highest education level: Bachelor's degree (e.g., BA, AB, BS)  Occupational History   Occupation: Retired  Tobacco Use   Smoking status: Former    Types: Cigarettes   Smokeless tobacco: Never   Tobacco comments:    Former smoker 10/01/23  Vaping Use   Vaping status: Never Used  Substance and Sexual Activity   Alcohol use: Yes    Comment: ocass   Drug use: No   Sexual activity: Not Currently    Partners: Male    Birth control/protection: Abstinence  Other Topics Concern   Not on file  Social History Narrative   ** Merged History Encounter **       Social Drivers of Health   Tobacco Use: Medium Risk (02/03/2024)   Patient History    Smoking Tobacco Use: Former    Smokeless Tobacco Use: Never    Passive Exposure: Not on Actuary Strain: Low Risk (03/12/2023)   Overall Financial Resource Strain (CARDIA)    Difficulty of Paying Living Expenses: Not very hard  Food Insecurity: No Food Insecurity (10/01/2023)   Epic    Worried About Radiation Protection Practitioner of Food in the Last Year: Never true    Ran Out of Food in the Last Year: Never true  Transportation Needs: No Transportation Needs (10/01/2023)   Epic    Lack of Transportation (Medical): No    Lack of Transportation (Non-Medical): No  Physical Activity: Not on file  Stress: Not on file  Social Connections: Socially Integrated (10/01/2023)   Social Connection and Isolation Panel    Frequency of Communication with Friends and  Family: More than three times a week    Frequency of Social Gatherings with Friends and Family: More than three times a week    Attends Religious Services: More than 4 times per year    Active Member of [employer] or Organizations: Yes    Attends Banker Meetings: 1 to 4 times per year    Marital Status: Married  Catering Manager Violence: Not At Risk (10/01/2023)   Epic    Fear of Current or Ex-Partner: No    Emotionally Abused: No    Physically Abused: No    Sexually Abused: No  Depression (PHQ2-9): High Risk (  06/06/2021)   Depression (PHQ2-9)    PHQ-2 Score: 11  Alcohol Screen: Low Risk (03/12/2023)   Alcohol Screen    Last Alcohol Screening Score (AUDIT): 1  Housing: Low Risk (10/01/2023)   Epic    Unable to Pay for Housing in the Last Year: No    Number of Times Moved in the Last Year: 0    Homeless in the Last Year: No  Utilities: Not At Risk (10/01/2023)   Epic    Threatened with loss of utilities: No  Health Literacy: Not on file   Family History  Problem Relation Age of Onset   Thyroid  cancer Mother    Lung cancer Mother    Melanoma Mother    Diabetes Mother    Thyroid  disease Mother    Alcohol abuse Mother    Bladder Cancer Mother    Alcoholism Father    Cirrhosis Father    High blood pressure Father    High Cholesterol Father    Fibromyalgia Sister    Breast cancer Sister        30s   Breast cancer Maternal Grandmother        54s   Breast cancer Paternal Grandmother        59s   Breast cancer Daughter 76   Breast cancer Maternal Aunt        60s   Wt Readings from Last 3 Encounters:  02/03/24 117 kg (258 lb)  11/04/23 126.6 kg (279 lb)  10/11/23 126.8 kg (279 lb 8 oz)   BP 112/62   Pulse 69   Ht 5' 6 (1.676 m)   Wt 117 kg (258 lb)   SpO2 100%   BMI 41.64 kg/m   PHYSICAL EXAM: General:  NAD. No resp difficulty, arrived in [facility] HEENT: Normal Neck: Supple. No JVD. Thick neck Cor: Regular rate & rhythm. No rubs, gallops or murmurs. Lungs:  Faint coarse BS in LLL Abdomen: Obese, nontender, nondistended.  Extremities: No cyanosis, clubbing, rash, edema Neuro: Alert & oriented x 3, moves all 4 extremities w/o difficulty. Affect pleasant.  ASSESSMENT & PLAN: 1. Chronic systolic, now recovered CHF: Echo in 1/25 showed EF 30-35%, normal RV, mild MR. 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  No FH of cardiomyopathy, no substance abuse.  No chest pain or CAD history. She is maintaining NSR on amiodarone . cMRI 4/25 EF 54%, RVEF 56%, small area of possible prior myocarditis apical septum. Echo 9/25 showed EF 65-70%, G1DD, RV normal. NYHA class I-II (confounded by severe knee pain), not volume overloaded on exam.  - Continue Lasix  40 mg bid + 20 KCL daily.  BMET today.  - Continue spironolactone  25 mg daily.  - Continue Toprol  XL 25 mg bid - Continue Entresto  49/51 bid.  - Continue Jardiance  10 mg daily. No GU symptoms. 2.  Atrial fibrillation: Paroxysmal.  Diagnosed in 1/25. DCCV to NSR 03/14/23 and again 03/22/23. She is now on Tikosyn .  BMI thought to be too high for ablation.  Regular on exam today - Continue Tikosyn  500 mg bid.   - Continue Toprol  XL 25 mg bid.  - Continue Eliquis  5 mg bid. No bleeding issues. CBC today 3. Hypothyroid: Continue levothyroxine .  4. HTN: BP well-controlled today. - Continue current meds as above. 5.OSA: Previously diagnosed with OSA. Recent sleep study showed no evidence of OSA or central sleep apnea. 6. Obesity: Body mass index is 41.64 kg/m.  She is on tirzepatide . - Down 25 lbs!  Follow up in 6 months with Dr.  Rolan Raisin [place], [place] 02/03/2024

## 2024-01-31 ENCOUNTER — Telehealth (HOSPITAL_COMMUNITY): Payer: Self-pay

## 2024-01-31 NOTE — Telephone Encounter (Signed)
 Called to confirm/remind patient of their appointment at the [facility] on 02/03/24.   Appointment:   [x] Confirmed  [] Left mess   [] No answer/No voice mail  [] VM Full/unable to leave message  [] Phone not in service  Patient reminded to bring all medications and/or complete list.  Confirmed patient has transportation. Gave directions, instructed to utilize valet parking.

## 2024-02-03 ENCOUNTER — Ambulatory Visit (HOSPITAL_COMMUNITY): Payer: Self-pay | Admitting: Family Medicine

## 2024-02-03 ENCOUNTER — Encounter (HOSPITAL_COMMUNITY): Payer: Self-pay

## 2024-02-03 ENCOUNTER — Ambulatory Visit (HOSPITAL_COMMUNITY)
Admission: RE | Admit: 2024-02-03 | Discharge: 2024-02-03 | Disposition: A | Discharge status: Home / Self Care | Source: Ambulatory Visit | Attending: Family Medicine | Admitting: Family Medicine

## 2024-02-03 VITALS — BP 112/62 | HR 69 | Ht 66.0 in | Wt 258.0 lb

## 2024-02-03 DIAGNOSIS — I480 Paroxysmal atrial fibrillation: Secondary | ICD-10-CM | POA: Diagnosis not present

## 2024-02-03 DIAGNOSIS — E039 Hypothyroidism, unspecified: Secondary | ICD-10-CM | POA: Diagnosis not present

## 2024-02-03 DIAGNOSIS — I4891 Unspecified atrial fibrillation: Secondary | ICD-10-CM

## 2024-02-03 DIAGNOSIS — I10 Essential (primary) hypertension: Secondary | ICD-10-CM | POA: Diagnosis not present

## 2024-02-03 DIAGNOSIS — I5032 Chronic diastolic (congestive) heart failure: Secondary | ICD-10-CM

## 2024-02-03 DIAGNOSIS — Z7901 Long term (current) use of anticoagulants: Secondary | ICD-10-CM | POA: Diagnosis not present

## 2024-02-03 DIAGNOSIS — Z7984 Long term (current) use of oral hypoglycemic drugs: Secondary | ICD-10-CM | POA: Diagnosis not present

## 2024-02-03 DIAGNOSIS — G4733 Obstructive sleep apnea (adult) (pediatric): Secondary | ICD-10-CM | POA: Diagnosis not present

## 2024-02-03 DIAGNOSIS — Z87891 Personal history of nicotine dependence: Secondary | ICD-10-CM | POA: Diagnosis not present

## 2024-02-03 DIAGNOSIS — Z79890 Hormone replacement therapy (postmenopausal): Secondary | ICD-10-CM | POA: Diagnosis not present

## 2024-02-03 DIAGNOSIS — Z79899 Other long term (current) drug therapy: Secondary | ICD-10-CM | POA: Diagnosis not present

## 2024-02-03 DIAGNOSIS — E785 Hyperlipidemia, unspecified: Secondary | ICD-10-CM | POA: Diagnosis not present

## 2024-02-03 DIAGNOSIS — I110 Hypertensive heart disease with heart failure: Secondary | ICD-10-CM | POA: Diagnosis not present

## 2024-02-03 DIAGNOSIS — E669 Obesity, unspecified: Secondary | ICD-10-CM | POA: Diagnosis not present

## 2024-02-03 DIAGNOSIS — M25569 Pain in unspecified knee: Secondary | ICD-10-CM | POA: Diagnosis not present

## 2024-02-03 DIAGNOSIS — Z6841 Body Mass Index (BMI) 40.0 and over, adult: Secondary | ICD-10-CM | POA: Diagnosis not present

## 2024-02-03 LAB — BASIC METABOLIC PANEL WITH GFR
Anion gap: 12 (ref 5–15)
BUN: 17 mg/dL (ref 8–23)
CO2: 23 mmol/L (ref 22–32)
Calcium: 8.9 mg/dL (ref 8.9–10.3)
Chloride: 104 mmol/L (ref 98–111)
Creatinine, Ser: 0.81 mg/dL (ref 0.44–1.00)
GFR, Estimated: >60 mL/min (ref >60)
Glucose, Bld: 98 mg/dL (ref 70–99)
Potassium: 4 mmol/L (ref 3.5–5.1)
Sodium: 139 mmol/L (ref 135–145)

## 2024-02-03 LAB — CBC
HCT: 42.9 % (ref 36.0–46.0)
Hemoglobin: 14.3 g/dL (ref 12.0–15.0)
MCH: 31.2 pg (ref 26.0–34.0)
MCHC: 33.3 g/dL (ref 30.0–36.0)
MCV: 93.5 fL (ref 80.0–100.0)
Platelets: 249 K/uL (ref 150–400)
RBC: 4.59 MIL/uL (ref 3.87–5.11)
RDW: 13.5 % (ref 11.5–15.5)
WBC: 5 K/uL (ref 4.0–10.5)
nRBC: 0 % (ref 0.0–0.2)

## 2024-02-03 NOTE — Addendum Note (Signed)
 Encounter addended by: Gerome Annabella CROME, RN on: 02/03/2024 11:45 AM  Actions taken: Clinical Note Signed, Follow-up modified

## 2024-02-03 NOTE — Patient Instructions (Signed)
 Thank you for coming in today  If you had labs drawn today, any labs that are abnormal the clinic will call you No news is good news  Medications: No changes   Follow up appointments:  Your physician recommends that you schedule a follow-up appointment in:  6 months With Dr. Rolan Please call our office to schedule the follow-up appointment in March 2026 for June 2026.    Do the following things EVERYDAY: Weigh yourself in the morning before breakfast. Write it down and keep it in a log. Take your medicines as prescribed Eat low salt foods--Limit salt (sodium) to 2000 mg per day.  Stay as active as you can everyday Limit all fluids for the day to less than 2 liters   At the [facility], you and your health needs are our priority. As part of our continuing mission to provide you with exceptional heart care, we have created designated Provider Care Teams. These Care Teams include your primary Cardiologist (physician) and Advanced Practice Providers (APPs- Physician Assistants and Nurse Practitioners) who all work together to provide you with the care you need, when you need it.   You may see any of the following providers on your designated Care Team at your next follow up: Dr Toribio Fuel Dr Ezra Rolan Dr. Ria Gardenia Greig Lenetta, NP Caffie Shed, [place] [facility] [place], [place] Beckey Coe, NP Tinnie Redman, PharmD   Please be sure to bring in all your medications bottles to every appointment.    Thank you for choosing [facility]  If you have any questions or concerns before your next appointment please send us  a message through [place] or call our office at [phone].    TO LEAVE A MESSAGE FOR THE NURSE SELECT OPTION 2, PLEASE LEAVE A MESSAGE INCLUDING: YOUR NAME DATE OF BIRTH CALL BACK NUMBER REASON FOR CALL**this is important as we prioritize the call backs  YOU WILL RECEIVE A CALL  BACK THE SAME DAY AS LONG AS YOU CALL BEFORE 4:00 PM

## 2024-02-11 ENCOUNTER — Ambulatory Visit: Admitting: Obstetrics and Gynecology

## 2024-02-17 ENCOUNTER — Ambulatory Visit

## 2024-02-17 DIAGNOSIS — I480 Paroxysmal atrial fibrillation: Secondary | ICD-10-CM

## 2024-02-18 ENCOUNTER — Other Ambulatory Visit: Payer: Self-pay | Admitting: Cardiology

## 2024-02-18 LAB — CUP PACEART REMOTE DEVICE CHECK
Date Time Interrogation Session: 20251230102700
Implantable Pulse Generator Implant Date: 20250521
Pulse Gen Serial Number: 146174

## 2024-02-19 ENCOUNTER — Ambulatory Visit: Payer: Self-pay | Admitting: Cardiovascular Disease

## 2024-02-25 NOTE — Progress Notes (Signed)
 Remote Loop Recorder Transmission

## 2024-02-27 ENCOUNTER — Other Ambulatory Visit (HOSPITAL_BASED_OUTPATIENT_CLINIC_OR_DEPARTMENT_OTHER): Payer: Self-pay

## 2024-02-27 ENCOUNTER — Other Ambulatory Visit (HOSPITAL_COMMUNITY): Payer: Self-pay

## 2024-02-27 ENCOUNTER — Other Ambulatory Visit (HOSPITAL_COMMUNITY): Payer: Self-pay | Admitting: Cardiology

## 2024-02-27 MED ORDER — SACUBITRIL-VALSARTAN 49-51 MG PO TABS: 1.0000 | ORAL_TABLET | Freq: Two times a day (BID) | ORAL | 11 refills | Status: DC

## 2024-03-02 ENCOUNTER — Encounter: Payer: Self-pay | Admitting: *Deleted

## 2024-03-05 ENCOUNTER — Other Ambulatory Visit: Payer: Self-pay | Admitting: Cardiovascular Disease

## 2024-03-05 ENCOUNTER — Other Ambulatory Visit (HOSPITAL_BASED_OUTPATIENT_CLINIC_OR_DEPARTMENT_OTHER): Payer: Self-pay

## 2024-03-05 DIAGNOSIS — I480 Paroxysmal atrial fibrillation: Secondary | ICD-10-CM

## 2024-03-05 MED ORDER — APIXABAN 5 MG PO TABS: 5.0000 mg | ORAL_TABLET | Freq: Two times a day (BID) | ORAL | 5 refills | Status: AC

## 2024-03-09 ENCOUNTER — Other Ambulatory Visit (HOSPITAL_COMMUNITY): Payer: Self-pay

## 2024-03-09 ENCOUNTER — Other Ambulatory Visit: Payer: Self-pay

## 2024-03-12 ENCOUNTER — Ambulatory Visit: Admitting: Obstetrics

## 2024-03-16 MED ORDER — ZEPBOUND 7.5 MG/0.5ML ~~LOC~~ SOLN: 7.5000 mg | SUBCUTANEOUS | 3 refills | Status: AC

## 2024-03-16 MED ORDER — EMPAGLIFLOZIN 10 MG PO TABS: 10.0000 mg | ORAL_TABLET | Freq: Every day | ORAL | 0 refills | Status: AC

## 2024-03-16 NOTE — Addendum Note (Signed)
 Addended by: Vern Prestia D on: 03/16/2024 12:56 PM   Modules accepted: Orders

## 2024-03-16 NOTE — Telephone Encounter (Signed)
 Labs done on 10/01/23

## 2024-03-18 ENCOUNTER — Encounter: Payer: Self-pay | Admitting: Obstetrics

## 2024-03-18 ENCOUNTER — Telehealth: Payer: Self-pay | Admitting: Obstetrics

## 2024-03-18 NOTE — Telephone Encounter (Signed)
 Letter to cardiology regarding eliquis  management prior to procedure.

## 2024-03-19 ENCOUNTER — Ambulatory Visit

## 2024-03-19 ENCOUNTER — Ambulatory Visit: Payer: Self-pay | Admitting: Cardiovascular Disease

## 2024-03-19 DIAGNOSIS — I4891 Unspecified atrial fibrillation: Secondary | ICD-10-CM

## 2024-03-19 LAB — CUP PACEART REMOTE DEVICE CHECK
Date Time Interrogation Session: 20260129013100
Implantable Pulse Generator Implant Date: 20250521
Pulse Gen Serial Number: 146174

## 2024-03-20 ENCOUNTER — Telehealth (HOSPITAL_BASED_OUTPATIENT_CLINIC_OR_DEPARTMENT_OTHER): Payer: Self-pay | Admitting: *Deleted

## 2024-03-20 NOTE — Telephone Encounter (Signed)
-----   Message from Josefa Beauvais, NP sent at 03/20/2024  6:47 AM EST ----- Please add to preoperative pool.  Thank you for your help.  Josefa HERO. Cleaver NP-C  [image]   03/20/2024, 6:48 AM [facility] [address]th Floor [place], [place] [address] Office [phone] ----- Message ----- From: Nancey Eulas BRAVO, MD Sent: 03/19/2024   7:24 PM EST To: Cv Div Preop

## 2024-03-20 NOTE — Telephone Encounter (Signed)
"  ° °  Pre-operative Risk Assessment    Patient Name: Cassandra Ho  DOB: 06-15-1945 MRN: [id number]   Date of last office visit: 02/03/24 [facility] Date of next office visit: NONE   Request for Surgical Clearance    Procedure:  vaginal/robotic procedure that will last <1 hour.  FOR STRESS INCONTINENCE; risk stratification and medical optimization for urethral bulking injection in the office.    Date of Surgery:  Clearance TBD                                Surgeon:  DR. LIANNE GILLIS Surgeon's Group or Practice Name:   [facility] at [facility] Phone number:   [phone]  Fax number:   [phone]   Type of Clearance Requested:   - Medical  - Pharmacy:  Hold Apixaban  (Eliquis )     Type of Anesthesia:  Local    Additional requests/questions:    Signed, Willis Holquin   03/20/2024, 8:26 AM    Message Received: Today Cleaver, Josefa HERO, NP  Ashanta Amoroso M, CMA Please add to preoperative pool.  Thank you for your help.  Josefa HERO. Cleaver NP-C  [image]   03/20/2024, 6:48 AM [facility] [address] 5th [address] [place], [place] [address] Office [phone]       Previous Messages  Letter  Gillis Lianne DASEN, MD on 03/18/2024     [facility] at [facility] [address] [place], [place]  [address] Phone:  [phone]   Fax:  [phone]   March 18, 2024     Dear Nancey,   My name is Lianne DASEN Gillis, and I'm a Urogynecologist/female pelvic medicine and reconstructive surgery specialist at [facility].  I have been caring for our mutual patient, Cassandra Ho.  She is planning surgery with me, and I'm hoping that you can help to provide a letter stating her risk stratification and medical optimization for urethral bulking injection in the office.   Here are some details about her surgery: We are planning procedure for her stress incontinence.   Her surgery will be a  vaginal/robotic procedure that will last <1 hour.   She will most likely undergo local anesthesia.   Her procedure will be outpatient.   Please provide any specific instructions that may help with her perioperative management, including management of anticoagulation if applicable.  Specifically, eliquis  use for anticoagulation.   If you could send a letter to our office [facility]) or to me through Epic, I would greatly appreciate it. Our contact information is above.   Sincerely,    Lianne DASEN Gillis, MD        [facility] at [facility]                           Georgetta Crafton [place], [phone]               "

## 2024-03-23 ENCOUNTER — Other Ambulatory Visit: Payer: Self-pay | Admitting: Cardiology

## 2024-03-24 ENCOUNTER — Encounter: Payer: Self-pay | Admitting: Obstetrics

## 2024-03-26 ENCOUNTER — Other Ambulatory Visit: Payer: Self-pay | Admitting: Cardiology

## 2024-03-26 MED ORDER — SACUBITRIL-VALSARTAN 49-51 MG PO TABS: 1.0000 | ORAL_TABLET | Freq: Two times a day (BID) | ORAL | 8 refills | Status: AC

## 2024-03-27 NOTE — Progress Notes (Signed)
 Remote Loop Recorder Transmission

## 2024-04-13 ENCOUNTER — Ambulatory Visit: Admitting: Obstetrics

## 2024-04-20 ENCOUNTER — Ambulatory Visit

## 2024-04-30 ENCOUNTER — Ambulatory Visit: Admitting: Obstetrics

## 2024-05-13 ENCOUNTER — Ambulatory Visit: Admitting: Cardiovascular Disease

## 2024-05-21 ENCOUNTER — Ambulatory Visit

## 2024-05-28 ENCOUNTER — Ambulatory Visit: Admitting: Obstetrics

## 2024-06-21 ENCOUNTER — Ambulatory Visit

## 2024-07-22 ENCOUNTER — Ambulatory Visit

## 2024-08-22 ENCOUNTER — Ambulatory Visit

## 2024-09-22 ENCOUNTER — Ambulatory Visit

## 2024-10-23 ENCOUNTER — Ambulatory Visit

## 2024-11-23 ENCOUNTER — Ambulatory Visit

## 2024-12-24 ENCOUNTER — Ambulatory Visit

## 2025-01-24 ENCOUNTER — Ambulatory Visit
